# Patient Record
Sex: Female | Born: 1950 | Race: White | Hispanic: No | Marital: Married
Health system: Midwestern US, Academic
[De-identification: ages and names within clinical notes are randomized; demographics above are authoritative.]

---

## 2017-03-13 ENCOUNTER — Inpatient Hospital Stay: Admit: 2017-03-13 | Discharge: 2017-03-18 | Disposition: A | Discharge status: Home / Self Care | Payer: MEDICARE

## 2017-03-13 ENCOUNTER — Emergency Department: Admit: 2017-03-13 | Discharge: 2017-03-13 | Payer: MEDICARE

## 2017-03-13 ENCOUNTER — Encounter: Admit: 2017-03-13 | Discharge: 2017-03-13 | Payer: MEDICARE

## 2017-03-13 DIAGNOSIS — K7290 Hepatic failure, unspecified without coma: ICD-10-CM

## 2017-03-13 DIAGNOSIS — K759 Inflammatory liver disease, unspecified: ICD-10-CM

## 2017-03-13 DIAGNOSIS — R17 Unspecified jaundice: ICD-10-CM

## 2017-03-13 MED ORDER — CEFTRIAXONE INJ 1GM IVP
1 g | Freq: Once | INTRAVENOUS | 0 refills | Status: CP
Start: 2017-03-13 — End: ?
  Administered 2017-03-14: 05:00:00 1 g via INTRAVENOUS

## 2017-03-14 ENCOUNTER — Encounter: Admit: 2017-03-14 | Discharge: 2017-03-14 | Payer: MEDICARE

## 2017-03-14 DIAGNOSIS — R17 Unspecified jaundice: ICD-10-CM

## 2017-03-14 DIAGNOSIS — C801 Malignant (primary) neoplasm, unspecified: Principal | ICD-10-CM

## 2017-03-14 LAB — URINALYSIS DIPSTICK POC
Lab: 6 (ref 5.0–8.0)
Lab: >=1 (ref 1.003–1.035)
Lab: NEGATIVE
Lab: NEGATIVE
Lab: POSITIVE — AB

## 2017-03-14 LAB — ACETAMINOPHEN LEVEL
Lab: <10 ug/mL (ref <20.1)
Lab: <10 ug/mL (ref <20.1)

## 2017-03-14 LAB — CBC AND DIFF
Lab: 0 10*3/uL (ref 0–0.20)
Lab: 0 10*3/uL (ref 0–0.45)
Lab: 6.5 10*3/uL (ref 4.5–11.0)

## 2017-03-14 LAB — COMPREHENSIVE METABOLIC PANEL
Lab: 0.6 mg/dL (ref 0.4–1.00)
Lab: 102 MMOL/L (ref 98–110)
Lab: 12 mg/dL (ref 7–25)
Lab: 127 MMOL/L — ABNORMAL LOW (ref 137–147)
Lab: 147 U/L — ABNORMAL HIGH (ref 25–110)
Lab: 2.2 g/dL — ABNORMAL LOW (ref 3.5–5.0)
Lab: 22 MMOL/L (ref 21–30)
Lab: 3 10*3/uL (ref 3–12)
Lab: 362 U/L — ABNORMAL HIGH (ref 7–56)
Lab: 553 U/L — ABNORMAL HIGH (ref 7–40)
Lab: 7.9 mg/dL — ABNORMAL LOW (ref 8.5–10.6)
Lab: 8.3 mg/dL — ABNORMAL HIGH (ref 0.3–1.2)
Lab: 8.6 g/dL — ABNORMAL HIGH (ref 6.0–8.0)
Lab: 90 mg/dL (ref 70–100)
Lab: >60 mL/min (ref >60)
Lab: >60 mL/min (ref >60)
Lab: 0.5 mg/dL (ref 0.4–1.00)
Lab: 1.9 g/dL — ABNORMAL LOW (ref 3.5–5.0)
Lab: 103 MMOL/L — ABNORMAL LOW (ref 98–110)
Lab: 11 mg/dL (ref 7–25)
Lab: 128 MMOL/L — ABNORMAL LOW (ref 137–147)
Lab: 135 U/L — ABNORMAL HIGH (ref 25–110)
Lab: 2 — ABNORMAL LOW (ref 3–12)
Lab: 23 MMOL/L (ref 21–30)
Lab: 299 U/L — ABNORMAL HIGH (ref 7–56)
Lab: 3.8 MMOL/L — ABNORMAL HIGH (ref 3.5–5.1)
Lab: 461 U/L — ABNORMAL HIGH (ref 7–40)
Lab: 7.5 g/dL (ref 6.0–8.0)
Lab: 7.6 mg/dL — ABNORMAL HIGH (ref 0.3–1.2)
Lab: 7.8 mg/dL — ABNORMAL LOW (ref 8.5–10.6)
Lab: 78 mg/dL (ref 70–100)
Lab: >60 mL/min (ref >60)
Lab: >60 mL/min (ref >60)

## 2017-03-14 LAB — HEPATITIS PANEL, ACUTE
Lab: NEGATIVE
Lab: NEGATIVE
Lab: NEGATIVE
Lab: NEGATIVE

## 2017-03-14 LAB — IRON + BINDING CAPACITY + %SAT+ FERRITIN
Lab: 134 ug/dL (ref 50–160)
Lab: 180 ug/dL — ABNORMAL LOW (ref 270–380)
Lab: 634 ng/mL — ABNORMAL HIGH (ref 10–200)
Lab: 74 % — ABNORMAL HIGH (ref 28–42)

## 2017-03-14 LAB — AMPHETAMINES-URINE RANDOM: Lab: NEGATIVE

## 2017-03-14 LAB — COCAINE-URINE RANDOM: Lab: NEGATIVE

## 2017-03-14 LAB — URINALYSIS, MICROSCOPIC

## 2017-03-14 LAB — URINALYSIS DIPSTICK: Lab: NEGATIVE

## 2017-03-14 LAB — PROTIME INR (PT)
Lab: 2.2 — ABNORMAL HIGH (ref 0.8–1.2)
Lab: 2.3 — ABNORMAL HIGH (ref 0.8–1.2)

## 2017-03-14 LAB — ALPHA FETO PROTEIN (AFP): Lab: 22 ng/mL — ABNORMAL HIGH (ref >60)

## 2017-03-14 LAB — HEMOGLOBIN A1C
Lab: 5.3 % (ref 4.0–6.0)
Lab: 5.3 % (ref 4.0–6.0)

## 2017-03-14 LAB — BARBITURATES-URINE RANDOM: Lab: NEGATIVE

## 2017-03-14 LAB — CBC
Lab: 3.5 M/UL — ABNORMAL LOW (ref 4.0–5.0)
Lab: 31 % — ABNORMAL LOW (ref 36–45)
Lab: 32 pg (ref 26–34)
Lab: 5.4 10*3/uL (ref 4.5–11.0)

## 2017-03-14 LAB — PHOSPHORUS: Lab: 3.1 mg/dL (ref 2.0–4.5)

## 2017-03-14 LAB — SODIUM-URINE RANDOM: Lab: 76 MMOL/L

## 2017-03-14 LAB — POC TROPONIN: Lab: 0 ng/mL (ref 0.00–0.05)

## 2017-03-14 LAB — ALCOHOL LEVEL: Lab: <10 mg/dL

## 2017-03-14 LAB — CANNABINOIDS-URINE RANDOM: Lab: NEGATIVE

## 2017-03-14 LAB — OPIATES-URINE RANDOM: Lab: NEGATIVE

## 2017-03-14 LAB — BILIRUBIN, DIRECT: Lab: 5.4 mg/dL — ABNORMAL HIGH (ref <0.4)

## 2017-03-14 LAB — LIPASE: Lab: 55 U/L (ref 11–82)

## 2017-03-14 LAB — PHENCYCLIDINES-URINE RANDOM: Lab: NEGATIVE

## 2017-03-14 LAB — BENZODIAZEPINES-URINE RANDOM: Lab: NEGATIVE

## 2017-03-14 MED ORDER — HEPARIN, PORCINE (PF) 5,000 UNIT/0.5 ML IJ SYRG
5000 [IU] | SUBCUTANEOUS | 0 refills | Status: DC
Start: 2017-03-14 — End: 2017-03-16
  Administered 2017-03-14: 22:00:00 5000 [IU] via SUBCUTANEOUS

## 2017-03-14 MED ORDER — PHYTONADIONE (VITAMIN K1) 5 MG PO TAB
10 mg | Freq: Every day | ORAL | 0 refills | Status: CP
Start: 2017-03-14 — End: ?
  Administered 2017-03-15 – 2017-03-16 (×3): 10 mg via ORAL

## 2017-03-14 MED ORDER — MAGNESIUM SULFATE IN D5W 1 GRAM/100 ML IV PGBK
1 g | INTRAVENOUS | 0 refills | Status: DC | PRN
Start: 2017-03-14 — End: 2017-03-14

## 2017-03-14 MED ORDER — CEFTRIAXONE INJ 1GM IVP
1 g | INTRAVENOUS | 0 refills | Status: DC
Start: 2017-03-14 — End: 2017-03-18
  Administered 2017-03-15 – 2017-03-18 (×4): 1 g via INTRAVENOUS

## 2017-03-14 MED ORDER — MELATONIN 3 MG PO TAB
3 mg | Freq: Every evening | ORAL | 0 refills | Status: DC | PRN
Start: 2017-03-14 — End: 2017-03-18

## 2017-03-14 MED ORDER — IOHEXOL 350 MG IODINE/ML IV SOLN
100 mL | Freq: Once | INTRAVENOUS | 0 refills | Status: CP
Start: 2017-03-14 — End: ?
  Administered 2017-03-14: 07:00:00 100 mL via INTRAVENOUS

## 2017-03-14 MED ORDER — SODIUM CHLORIDE 0.9 % FLUSH
3-5 mL | Freq: Three times a day (TID) | INTRAVENOUS | 0 refills | Status: DC
Start: 2017-03-14 — End: 2017-03-17

## 2017-03-14 MED ORDER — POTASSIUM CHLORIDE 20 MEQ PO TBTQ
40-60 meq | ORAL | 0 refills | Status: DC | PRN
Start: 2017-03-14 — End: 2017-03-14

## 2017-03-14 MED ORDER — SODIUM CHLORIDE 0.9 % IJ SOLN
50 mL | Freq: Once | INTRAVENOUS | 0 refills | Status: CP
Start: 2017-03-14 — End: ?
  Administered 2017-03-14: 07:00:00 50 mL via INTRAVENOUS

## 2017-03-14 MED ORDER — DOCUSATE SODIUM 100 MG PO CAP
100 mg | Freq: Every day | ORAL | 0 refills | Status: DC | PRN
Start: 2017-03-14 — End: 2017-03-18

## 2017-03-14 MED ORDER — POTASSIUM CHLORIDE 20 MEQ/15 ML PO LIQD
40-60 meq | NASOGASTRIC | 0 refills | Status: DC | PRN
Start: 2017-03-14 — End: 2017-03-14

## 2017-03-14 MED ORDER — ONDANSETRON HCL (PF) 4 MG/2 ML IJ SOLN
4 mg | INTRAVENOUS | 0 refills | Status: DC | PRN
Start: 2017-03-14 — End: 2017-03-18
  Administered 2017-03-17 (×2): 4 mg via INTRAVENOUS

## 2017-03-14 MED ORDER — ACETAMINOPHEN 325 MG PO TAB
325 mg | ORAL | 0 refills | Status: DC | PRN
Start: 2017-03-14 — End: 2017-03-18
  Administered 2017-03-14 – 2017-03-18 (×4): 325 mg via ORAL

## 2017-03-15 ENCOUNTER — Inpatient Hospital Stay: Admit: 2017-03-15 | Discharge: 2017-03-15 | Payer: MEDICARE

## 2017-03-15 ENCOUNTER — Encounter: Admit: 2017-03-15 | Discharge: 2017-03-15 | Payer: MEDICARE

## 2017-03-15 DIAGNOSIS — C801 Malignant (primary) neoplasm, unspecified: Principal | ICD-10-CM

## 2017-03-15 DIAGNOSIS — R17 Unspecified jaundice: ICD-10-CM

## 2017-03-15 DIAGNOSIS — K7581 Nonalcoholic steatohepatitis (NASH): Principal | ICD-10-CM

## 2017-03-15 LAB — PROTIME INR (PT): Lab: 2.1 — ABNORMAL HIGH (ref 0.8–1.2)

## 2017-03-15 LAB — COMPREHENSIVE METABOLIC PANEL
Lab: 1.8 g/dL — ABNORMAL LOW (ref 3.5–5.0)
Lab: 119 U/L — ABNORMAL HIGH (ref 25–110)
Lab: 126 MMOL/L — ABNORMAL LOW (ref 137–147)
Lab: 21 MMOL/L (ref 21–30)
Lab: 290 U/L — ABNORMAL HIGH (ref 7–56)
Lab: 464 U/L — ABNORMAL HIGH (ref 7–40)
Lab: 7.6 mg/dL — ABNORMAL HIGH (ref 0.3–1.2)
Lab: 7.8 g/dL — ABNORMAL LOW (ref 6.0–8.0)
Lab: 8.2 mg/dL — ABNORMAL LOW (ref 8.5–10.6)
Lab: >60 mL/min (ref >60)
Lab: >60 mL/min (ref >60)
Lab: 2 — ABNORMAL LOW (ref 3–12)

## 2017-03-15 LAB — ANTI-SMOOTH MUSCLE AB: Lab: <20 {titer} (ref <20)

## 2017-03-15 LAB — ANTI-NUCLEAR ANTIBODY(ANA)

## 2017-03-15 LAB — ANTI-NUCLEAR AB(ANA)-QUANT: Lab: 160 — ABNORMAL HIGH (ref <80)

## 2017-03-15 LAB — CBC: Lab: 4.7 10*3/uL (ref 4.5–11.0)

## 2017-03-15 LAB — ANTI-MITOCHONDRIAL ANTIBODY: Lab: <20 {titer} (ref <20)

## 2017-03-15 LAB — ALPHA-1-ANTITRYPSIN, TOTAL: Lab: 115

## 2017-03-15 MED ORDER — GADOBENATE DIMEGLUMINE 529 MG/ML (0.1MMOL/0.2ML) IV SOLN
15 mL | Freq: Once | INTRAVENOUS | 0 refills | Status: CP
Start: 2017-03-15 — End: ?
  Administered 2017-03-15: 06:00:00 15 mL via INTRAVENOUS

## 2017-03-15 MED ORDER — MORPHINE 2 MG/ML IV CRTG
1-2 mg | INTRAVENOUS | 0 refills | Status: DC | PRN
Start: 2017-03-15 — End: 2017-03-18

## 2017-03-15 MED ORDER — FENTANYL CITRATE (PF) 50 MCG/ML IJ SOLN
0 refills | Status: CP
Start: 2017-03-15 — End: ?
  Administered 2017-03-15 (×2): 50 ug via INTRAVENOUS

## 2017-03-15 MED ORDER — MIDAZOLAM 1 MG/ML IJ SOLN
1 mg | Freq: Once | INTRAVENOUS | 0 refills | Status: CP
Start: 2017-03-15 — End: ?
  Administered 2017-03-15: 21:00:00 1 mg via INTRAVENOUS

## 2017-03-15 MED ORDER — MIDAZOLAM 1 MG/ML IJ SOLN
0 refills | Status: CP
Start: 2017-03-15 — End: ?
  Administered 2017-03-15: 21:00:00 1 mg via INTRAVENOUS

## 2017-03-15 MED ORDER — FENTANYL CITRATE (PF) 50 MCG/ML IJ SOLN
50 ug | Freq: Once | INTRAVENOUS | 0 refills | Status: CP
Start: 2017-03-15 — End: ?
  Administered 2017-03-15: 21:00:00 50 ug via INTRAVENOUS

## 2017-03-15 MED ORDER — IOPAMIDOL 61 % IV SOLN
25 mL | Freq: Once | INTRAVENOUS | 0 refills | Status: CP
Start: 2017-03-15 — End: ?
  Administered 2017-03-15: 21:00:00 25 mL via INTRAVENOUS

## 2017-03-15 MED ORDER — FENTANYL CITRATE (PF) 50 MCG/ML IJ SOLN
0 refills | Status: CP
Start: 2017-03-15 — End: ?
  Administered 2017-03-15: 21:00:00 50 ug via INTRAVENOUS

## 2017-03-15 MED ORDER — SODIUM CHLORIDE 0.9 % IV SOLP
INTRAVENOUS | 0 refills | Status: CN
Start: 2017-03-15 — End: ?

## 2017-03-16 ENCOUNTER — Encounter: Admit: 2017-03-16 | Discharge: 2017-03-16 | Payer: MEDICARE

## 2017-03-16 DIAGNOSIS — R160 Hepatomegaly, not elsewhere classified: Principal | ICD-10-CM

## 2017-03-16 DIAGNOSIS — K7469 Other cirrhosis of liver: ICD-10-CM

## 2017-03-16 LAB — COMPREHENSIVE METABOLIC PANEL: Lab: 129 MMOL/L — ABNORMAL LOW (ref 137–147)

## 2017-03-16 LAB — CERULOPLASMIN: Lab: 24 mg/dL (ref 20.0–60.0)

## 2017-03-16 LAB — CULTURE-URINE W/SENSITIVITY
Lab: >10
Lab: >10 — AB

## 2017-03-16 LAB — PROTIME INR (PT): Lab: 2 M/UL — ABNORMAL HIGH (ref 0.8–1.2)

## 2017-03-16 LAB — CBC: Lab: 4.2 K/UL — ABNORMAL LOW (ref >60)

## 2017-03-16 MED ORDER — SULFAMETHOXAZOLE-TRIMETHOPRIM 800-160 MG PO TAB
1 | ORAL | 0 refills | Status: DC
Start: 2017-03-16 — End: 2017-03-18

## 2017-03-16 MED ORDER — CALCIUM CARBONATE-VITAMIN D3 500 MG(1,250MG) -200 UNIT PO TAB
1 | Freq: Two times a day (BID) | ORAL | 0 refills | Status: DC
Start: 2017-03-16 — End: 2017-03-18
  Administered 2017-03-17 – 2017-03-18 (×4): 1 via ORAL

## 2017-03-16 MED ORDER — TRAMADOL 50 MG PO TAB
50 mg | ORAL | 0 refills | Status: DC | PRN
Start: 2017-03-16 — End: 2017-03-18

## 2017-03-16 MED ORDER — PREDNISONE 20 MG PO TAB
40 mg | Freq: Every day | ORAL | 0 refills | Status: DC
Start: 2017-03-16 — End: 2017-03-18
  Administered 2017-03-16 – 2017-03-18 (×3): 40 mg via ORAL

## 2017-03-16 MED ORDER — LACTULOSE 10 GRAM/15 ML PO SOLN
30 mL | Freq: Three times a day (TID) | ORAL | 0 refills | Status: DC
Start: 2017-03-16 — End: 2017-03-18
  Administered 2017-03-17 – 2017-03-18 (×5): 20 g via ORAL

## 2017-03-17 LAB — PROTIME INR (PT): Lab: 2.1 M/UL — ABNORMAL HIGH (ref >60)

## 2017-03-17 LAB — COMPREHENSIVE METABOLIC PANEL: Lab: 129 MMOL/L — ABNORMAL LOW (ref >60)

## 2017-03-17 LAB — CBC: Lab: 4.4 K/UL — ABNORMAL LOW (ref 4.5–11.0)

## 2017-03-17 MED ORDER — SODIUM CHLORIDE 0.9 % IV SOLP
INTRAVENOUS | 0 refills | Status: DC
Start: 2017-03-17 — End: 2017-03-17

## 2017-03-18 ENCOUNTER — Emergency Department: Admit: 2017-03-13 | Discharge: 2017-03-13 | Payer: MEDICARE

## 2017-03-18 ENCOUNTER — Encounter: Admit: 2017-03-18 | Discharge: 2017-03-18 | Payer: MEDICARE

## 2017-03-18 ENCOUNTER — Inpatient Hospital Stay: Admit: 2017-03-14 | Discharge: 2017-03-14 | Payer: MEDICARE

## 2017-03-18 DIAGNOSIS — K7460 Unspecified cirrhosis of liver: ICD-10-CM

## 2017-03-18 DIAGNOSIS — R5381 Other malaise: ICD-10-CM

## 2017-03-18 DIAGNOSIS — Z800 Family history of malignant neoplasm of digestive organs: ICD-10-CM

## 2017-03-18 DIAGNOSIS — K739 Chronic hepatitis, unspecified: ICD-10-CM

## 2017-03-18 DIAGNOSIS — K7200 Acute and subacute hepatic failure without coma: ICD-10-CM

## 2017-03-18 DIAGNOSIS — K754 Autoimmune hepatitis: Principal | ICD-10-CM

## 2017-03-18 DIAGNOSIS — B9620 Unspecified Escherichia coli [E. coli] as the cause of diseases classified elsewhere: ICD-10-CM

## 2017-03-18 DIAGNOSIS — F17210 Nicotine dependence, cigarettes, uncomplicated: ICD-10-CM

## 2017-03-18 DIAGNOSIS — Z9049 Acquired absence of other specified parts of digestive tract: ICD-10-CM

## 2017-03-18 DIAGNOSIS — N3001 Acute cystitis with hematuria: ICD-10-CM

## 2017-03-18 DIAGNOSIS — Z89611 Acquired absence of right leg above knee: ICD-10-CM

## 2017-03-18 DIAGNOSIS — K7581 Nonalcoholic steatohepatitis (NASH): ICD-10-CM

## 2017-03-18 DIAGNOSIS — E871 Hypo-osmolality and hyponatremia: ICD-10-CM

## 2017-03-18 DIAGNOSIS — Z85828 Personal history of other malignant neoplasm of skin: ICD-10-CM

## 2017-03-18 DIAGNOSIS — D696 Thrombocytopenia, unspecified: ICD-10-CM

## 2017-03-18 LAB — COMPREHENSIVE METABOLIC PANEL: Lab: 131 MMOL/L — ABNORMAL LOW (ref >60)

## 2017-03-18 LAB — T SPOT TB (QUANTIFERON TB)
Lab: NEGATIVE
Lab: 0
Lab: 0

## 2017-03-18 LAB — CBC: Lab: 9 K/UL — ABNORMAL LOW (ref >60)

## 2017-03-18 LAB — IMMUNOGLOBULIN G (IGG): Lab: 454 mg/dL — ABNORMAL HIGH (ref 762–1488)

## 2017-03-18 LAB — PROTIME INR (PT): Lab: 1.9 MMOL/L — ABNORMAL HIGH (ref >60)

## 2017-03-18 MED ORDER — PREDNISONE 20 MG PO TAB
40 mg | ORAL_TABLET | Freq: Every day | ORAL | 0 refills | Status: AC
Start: 2017-03-18 — End: 2017-03-30
  Filled 2017-03-18 (×2): qty 30, 15d supply, fill #1

## 2017-03-18 MED ORDER — OXYCODONE-ACETAMINOPHEN 7.5-325 MG PO TAB
1-2 | ORAL_TABLET | ORAL | 0 refills | 2.00000 days | Status: AC | PRN
Start: 2017-03-18 — End: 2017-07-26
  Filled 2017-03-18: qty 20, 3d supply

## 2017-03-18 MED ORDER — SULFAMETHOXAZOLE-TRIMETHOPRIM 800-160 MG PO TAB
1 | ORAL_TABLET | ORAL | 0 refills | Status: AC
Start: 2017-03-18 — End: 2017-03-30
  Filled 2017-03-18 (×2): qty 9, 21d supply, fill #1

## 2017-03-18 MED ORDER — CALCIUM CARBONATE-VITAMIN D3 500 MG(1,250MG) -200 UNIT PO TAB
1 | ORAL_TABLET | Freq: Two times a day (BID) | ORAL | 1 refills | 33.00000 days | Status: AC
Start: 2017-03-18 — End: 2017-06-15

## 2017-03-18 MED ORDER — LACTULOSE 10 GRAM/15 ML PO SOLN
20 g | Freq: Three times a day (TID) | ORAL | 1 refills | 21.00000 days | Status: AC
Start: 2017-03-18 — End: 2017-03-23
  Filled 2017-03-18 (×2): qty 473, 5d supply, fill #1

## 2017-03-19 LAB — THIOPURINE METHYLTRANSFERASE RBC: Lab: 28

## 2017-03-20 ENCOUNTER — Encounter: Admit: 2017-03-20 | Discharge: 2017-03-20 | Payer: MEDICARE

## 2017-03-20 LAB — HEMOCHROMATOSIS HFE GENE ANAL

## 2017-03-21 ENCOUNTER — Encounter: Admit: 2017-03-21 | Discharge: 2017-03-21 | Payer: MEDICARE

## 2017-03-23 ENCOUNTER — Encounter: Admit: 2017-03-23 | Discharge: 2017-03-23 | Payer: MEDICARE

## 2017-03-23 MED ORDER — LACTULOSE 10 GRAM/15 ML PO SOLN
20 g | Freq: Three times a day (TID) | ORAL | 1 refills | 21.00000 days | Status: AC
Start: 2017-03-23 — End: 2017-04-19

## 2017-03-26 ENCOUNTER — Encounter: Admit: 2017-03-26 | Discharge: 2017-03-26 | Payer: MEDICARE

## 2017-03-27 ENCOUNTER — Encounter: Admit: 2017-03-27 | Discharge: 2017-03-27 | Payer: MEDICARE

## 2017-03-27 DIAGNOSIS — K7469 Other cirrhosis of liver: Principal | ICD-10-CM

## 2017-03-27 LAB — COMPREHENSIVE METABOLIC PANEL
Lab: 0.7
Lab: 102
Lab: 12
Lab: 3.7
Lab: 4.7 — ABNORMAL HIGH (ref 0.0–1.0)
Lab: 7.9 — ABNORMAL LOW (ref 8.4–10.2)
Lab: 1.7 — ABNORMAL LOW (ref 3.4–4.8)
Lab: 10
Lab: 130 — ABNORMAL LOW (ref 136–145)
Lab: 168 — ABNORMAL HIGH (ref 5–34)
Lab: 216 — ABNORMAL HIGH (ref 40–150)
Lab: 22 — ABNORMAL LOW (ref 23–31)
Lab: 222 — ABNORMAL HIGH (ref 0–55)
Lab: 7.6
Lab: 84
Lab: 93 ratio

## 2017-03-27 LAB — PROTIME INR (PT): Lab: 17 — ABNORMAL HIGH (ref 9.9–12.1)

## 2017-03-28 ENCOUNTER — Encounter: Admit: 2017-03-28 | Discharge: 2017-03-28 | Payer: MEDICARE

## 2017-03-28 DIAGNOSIS — K7460 Unspecified cirrhosis of liver: Principal | ICD-10-CM

## 2017-03-28 DIAGNOSIS — R160 Hepatomegaly, not elsewhere classified: Principal | ICD-10-CM

## 2017-03-28 LAB — CBC AND DIFF
Lab: 17 — ABNORMAL HIGH (ref 11.5–14.5)
Lab: 3.9 — ABNORMAL LOW (ref 4.20–5.40)
Lab: 0.2
Lab: 0.8 — ABNORMAL LOW (ref 0.9–5.1)
Lab: 100 — ABNORMAL LOW (ref 130–400)
Lab: 12
Lab: 2
Lab: 31 — ABNORMAL HIGH (ref 27.0–31.0)
Lab: 33
Lab: 38
Lab: 8
Lab: 85 — ABNORMAL HIGH (ref 40–75)
Lab: 9 — ABNORMAL LOW (ref 18–47)
Lab: 9.4
Lab: 95

## 2017-03-29 ENCOUNTER — Encounter: Admit: 2017-03-29 | Discharge: 2017-03-29 | Payer: MEDICARE

## 2017-03-29 ENCOUNTER — Ambulatory Visit: Admit: 2017-03-29 | Discharge: 2017-03-30 | Payer: MEDICARE

## 2017-03-29 MED ORDER — MIDAZOLAM 1 MG/ML IJ SOLN
0 refills | Status: CP
Start: 2017-03-29 — End: ?
  Administered 2017-03-29 (×2): 1 mg via INTRAVENOUS

## 2017-03-29 MED ORDER — SODIUM CHLORIDE 0.9 % IJ SOLN
50 mL | Freq: Once | INTRAVENOUS | 0 refills | Status: CP
Start: 2017-03-29 — End: ?
  Administered 2017-03-29: 22:00:00 50 mL via INTRAVENOUS

## 2017-03-29 MED ORDER — IOHEXOL 350 MG IODINE/ML IV SOLN
80 mL | Freq: Once | INTRAVENOUS | 0 refills | Status: CP
Start: 2017-03-29 — End: ?
  Administered 2017-03-29: 22:00:00 80 mL via INTRAVENOUS

## 2017-03-29 MED ORDER — FENTANYL CITRATE (PF) 50 MCG/ML IJ SOLN
50 ug | Freq: Once | INTRAVENOUS | 0 refills | Status: CP
Start: 2017-03-29 — End: ?
  Administered 2017-03-29: 21:00:00 50 ug via INTRAVENOUS

## 2017-03-29 MED ORDER — FENTANYL CITRATE (PF) 50 MCG/ML IJ SOLN
0 refills | Status: CP
Start: 2017-03-29 — End: ?
  Administered 2017-03-29 (×2): 50 ug via INTRAVENOUS

## 2017-03-29 MED ORDER — MIDAZOLAM 1 MG/ML IJ SOLN
1 mg | Freq: Once | INTRAVENOUS | 0 refills | Status: CP
Start: 2017-03-29 — End: ?
  Administered 2017-03-29: 21:00:00 1 mg via INTRAVENOUS

## 2017-03-30 ENCOUNTER — Encounter: Admit: 2017-03-30 | Discharge: 2017-03-30 | Payer: MEDICARE

## 2017-03-30 DIAGNOSIS — Z881 Allergy status to other antibiotic agents status: ICD-10-CM

## 2017-03-30 DIAGNOSIS — K7581 Nonalcoholic steatohepatitis (NASH): ICD-10-CM

## 2017-03-30 DIAGNOSIS — K7290 Hepatic failure, unspecified without coma: ICD-10-CM

## 2017-03-30 DIAGNOSIS — Z79899 Other long term (current) drug therapy: ICD-10-CM

## 2017-03-30 DIAGNOSIS — Z85830 Personal history of malignant neoplasm of bone: ICD-10-CM

## 2017-03-30 DIAGNOSIS — K7469 Other cirrhosis of liver: Principal | ICD-10-CM

## 2017-03-30 DIAGNOSIS — F17210 Nicotine dependence, cigarettes, uncomplicated: ICD-10-CM

## 2017-03-30 MED ORDER — SULFAMETHOXAZOLE-TRIMETHOPRIM 800-160 MG PO TAB
1 | ORAL_TABLET | ORAL | 6 refills | Status: AC
Start: 2017-03-30 — End: ?

## 2017-03-30 MED ORDER — PREDNISONE 20 MG PO TAB
40 mg | ORAL_TABLET | Freq: Every day | ORAL | 1 refills | Status: AC
Start: 2017-03-30 — End: 2017-05-01

## 2017-04-02 ENCOUNTER — Encounter: Admit: 2017-04-02 | Discharge: 2017-04-02 | Payer: MEDICARE

## 2017-04-03 ENCOUNTER — Encounter: Admit: 2017-04-03 | Discharge: 2017-04-03 | Payer: MEDICARE

## 2017-04-03 DIAGNOSIS — K7469 Other cirrhosis of liver: Principal | ICD-10-CM

## 2017-04-03 LAB — COMPREHENSIVE METABOLIC PANEL
Lab: 130 — ABNORMAL LOW (ref 136–145)
Lab: 136 — ABNORMAL HIGH (ref 0–55)
Lab: 168 — ABNORMAL HIGH (ref 40–150)
Lab: 3.4 — ABNORMAL HIGH (ref 0.0–1.0)
Lab: 4
Lab: 75 — ABNORMAL LOW (ref 80–115)
Lab: 8.2 — ABNORMAL LOW (ref 8.4–10.2)
Lab: 1.7 — ABNORMAL LOW (ref 3.4–4.8)
Lab: 100
Lab: 104
Lab: 23
Lab: 7
Lab: 7.3
Lab: 86 — ABNORMAL HIGH (ref 5–34)

## 2017-04-03 LAB — PROTIME INR (PT)
Lab: 15 — ABNORMAL HIGH (ref 9.9–12.1)
Lab: 1.5 ratio

## 2017-04-04 ENCOUNTER — Encounter: Admit: 2017-04-04 | Discharge: 2017-04-04 | Payer: MEDICARE

## 2017-04-04 DIAGNOSIS — K7460 Unspecified cirrhosis of liver: Principal | ICD-10-CM

## 2017-04-05 ENCOUNTER — Encounter: Admit: 2017-04-05 | Discharge: 2017-04-05 | Payer: MEDICARE

## 2017-04-05 DIAGNOSIS — K7460 Unspecified cirrhosis of liver: Principal | ICD-10-CM

## 2017-04-05 LAB — CBC AND DIFF
Lab: 0
Lab: 0.1
Lab: 11 — ABNORMAL LOW (ref 12.0–16.0)
Lab: 35 — ABNORMAL LOW (ref 37.0–47.0)
Lab: 7.6
Lab: 0.3
Lab: 1.3
Lab: 3.6 — ABNORMAL LOW (ref 4.20–5.40)

## 2017-04-06 ENCOUNTER — Encounter: Admit: 2017-04-06 | Discharge: 2017-04-06 | Payer: MEDICARE

## 2017-04-10 ENCOUNTER — Encounter: Admit: 2017-04-10 | Discharge: 2017-04-10 | Payer: MEDICARE

## 2017-04-10 DIAGNOSIS — K7469 Other cirrhosis of liver: Principal | ICD-10-CM

## 2017-04-10 DIAGNOSIS — K7460 Unspecified cirrhosis of liver: Principal | ICD-10-CM

## 2017-04-10 LAB — COMPREHENSIVE METABOLIC PANEL
Lab: 1.9 — ABNORMAL LOW (ref 3.4–4.8)
Lab: 104
Lab: 12
Lab: 174 — ABNORMAL HIGH (ref 40–150)
Lab: 24
Lab: 64 — ABNORMAL HIGH (ref 5–34)
Lab: 8.3 — ABNORMAL LOW (ref 8.4–10.2)
Lab: 0.6 mg/dL
Lab: 103 — ABNORMAL HIGH (ref 0–55)
Lab: 134 mmol/L — ABNORMAL LOW (ref 136–145)
Lab: 4.1
Lab: 7.4
Lab: 93
Lab: 99 ratio

## 2017-04-10 LAB — PROTIME INR (PT): Lab: 14 — ABNORMAL HIGH (ref 9.9–12.1)

## 2017-04-11 ENCOUNTER — Encounter: Admit: 2017-04-11 | Discharge: 2017-04-11 | Payer: MEDICARE

## 2017-04-11 DIAGNOSIS — K7460 Unspecified cirrhosis of liver: Principal | ICD-10-CM

## 2017-04-11 LAB — CBC AND DIFF
Lab: 0
Lab: 0.5
Lab: 0.5
Lab: 0.5
Lab: 1.8
Lab: 6.8
Lab: 0
Lab: 11 mg/dL — ABNORMAL LOW (ref 12.0–16.0)
Lab: 19 K/UL (ref 0–0.45)
Lab: 3.5 mg/dL — ABNORMAL LOW (ref 4.20–5.40)
Lab: 33 % (ref 0–2)
Lab: 35 mg/dL — ABNORMAL LOW (ref 37.0–47.0)
Lab: 5.3 10*3/uL (ref 0–0.20)
Lab: 9.2 mg/dL — ABNORMAL HIGH (ref >60)
Lab: 94 K/UL — ABNORMAL LOW (ref 130–400)
Lab: 99 mL/min — ABNORMAL HIGH (ref >60)

## 2017-04-12 ENCOUNTER — Encounter: Admit: 2017-04-12 | Discharge: 2017-04-12 | Payer: MEDICARE

## 2017-04-16 ENCOUNTER — Encounter: Admit: 2017-04-16 | Discharge: 2017-04-16 | Payer: MEDICARE

## 2017-04-16 DIAGNOSIS — K7469 Other cirrhosis of liver: Principal | ICD-10-CM

## 2017-04-16 LAB — COMPREHENSIVE METABOLIC PANEL
Lab: 15
Lab: 2.1 — ABNORMAL LOW (ref 3.4–4.8)
Lab: 48 — ABNORMAL HIGH (ref 5–34)
Lab: 7.3
Lab: 0.6 mg/dL
Lab: 10
Lab: 102
Lab: 133 mmol/L — ABNORMAL LOW (ref 136–145)
Lab: 167 — ABNORMAL HIGH (ref 40–150)
Lab: 2.3 mg/dL — ABNORMAL HIGH (ref 0.0–1.0)
Lab: 25
Lab: 3.8
Lab: 71 ratio — ABNORMAL LOW (ref 80–115)
Lab: 73 — ABNORMAL HIGH (ref 0–55)
Lab: 8.5
Lab: 92

## 2017-04-16 LAB — PROTIME INR (PT): Lab: 13 — ABNORMAL HIGH (ref 9.9–12.1)

## 2017-04-17 ENCOUNTER — Encounter: Admit: 2017-04-17 | Discharge: 2017-04-17 | Payer: MEDICARE

## 2017-04-17 DIAGNOSIS — K7460 Unspecified cirrhosis of liver: Principal | ICD-10-CM

## 2017-04-17 LAB — CBC AND DIFF
Lab: 0
Lab: 0.5
Lab: 0.7
Lab: 101 — ABNORMAL HIGH (ref 80.0–99.0)
Lab: 12
Lab: 2.7
Lab: 3.7 — ABNORMAL LOW (ref 4.20–5.40)
Lab: 5.4
Lab: 6.2
Lab: 61
Lab: 0.1
Lab: 0.5
Lab: 102 — ABNORMAL LOW (ref 130–400)
Lab: 16 — ABNORMAL HIGH (ref 11.5–14.5)
Lab: 30
Lab: 33
Lab: 8.7

## 2017-04-19 ENCOUNTER — Encounter: Admit: 2017-04-19 | Discharge: 2017-04-19 | Payer: MEDICARE

## 2017-04-19 DIAGNOSIS — R160 Hepatomegaly, not elsewhere classified: Principal | ICD-10-CM

## 2017-04-19 MED ORDER — LACTULOSE 10 GRAM/15 ML PO SOLN
20 g | Freq: Three times a day (TID) | ORAL | 11 refills | 21.00000 days | Status: AC
Start: 2017-04-19 — End: ?

## 2017-04-20 ENCOUNTER — Encounter: Admit: 2017-04-20 | Discharge: 2017-04-20 | Payer: MEDICARE

## 2017-04-23 ENCOUNTER — Encounter: Admit: 2017-04-23 | Discharge: 2017-04-23 | Payer: MEDICARE

## 2017-04-23 DIAGNOSIS — K7469 Other cirrhosis of liver: Principal | ICD-10-CM

## 2017-04-23 LAB — PROTIME INR (PT)
Lab: 1.2 ratio (ref 4–12)
Lab: 12 % — ABNORMAL HIGH (ref 9.9–12.1)

## 2017-04-30 ENCOUNTER — Ambulatory Visit: Admit: 2017-04-30 | Discharge: 2017-04-30 | Payer: MEDICARE

## 2017-04-30 ENCOUNTER — Encounter: Admit: 2017-04-30 | Discharge: 2017-04-30 | Payer: MEDICARE

## 2017-04-30 DIAGNOSIS — C220 Liver cell carcinoma: Principal | ICD-10-CM

## 2017-04-30 DIAGNOSIS — Z881 Allergy status to other antibiotic agents status: ICD-10-CM

## 2017-04-30 DIAGNOSIS — R160 Hepatomegaly, not elsewhere classified: ICD-10-CM

## 2017-04-30 MED ORDER — MIDAZOLAM 1 MG/ML IJ SOLN
1 mg | Freq: Once | INTRAVENOUS | 0 refills | Status: CP
Start: 2017-04-30 — End: ?
  Administered 2017-04-30: 16:00:00 1 mg via INTRAVENOUS

## 2017-04-30 MED ORDER — FENTANYL CITRATE (PF) 50 MCG/ML IJ SOLN
50 ug | Freq: Once | INTRAVENOUS | 0 refills | Status: CP
Start: 2017-04-30 — End: ?
  Administered 2017-04-30: 16:00:00 50 ug via INTRAVENOUS

## 2017-04-30 MED ORDER — MIDAZOLAM 1 MG/ML IJ SOLN
0 refills | Status: CP
Start: 2017-04-30 — End: ?
  Administered 2017-04-30 (×3): 1 mg via INTRAVENOUS

## 2017-04-30 MED ORDER — FENTANYL CITRATE (PF) 50 MCG/ML IJ SOLN
0 refills | Status: CP
Start: 2017-04-30 — End: ?
  Administered 2017-04-30: 16:00:00 50 ug via INTRAVENOUS

## 2017-05-01 ENCOUNTER — Encounter: Admit: 2017-05-01 | Discharge: 2017-05-01 | Payer: MEDICARE

## 2017-05-01 MED ORDER — PREDNISONE 20 MG PO TAB
40 mg | ORAL_TABLET | Freq: Every day | ORAL | 1 refills | Status: AC
Start: 2017-05-01 — End: 2017-05-09

## 2017-05-03 ENCOUNTER — Encounter: Admit: 2017-05-03 | Discharge: 2017-05-03 | Payer: MEDICARE

## 2017-05-03 DIAGNOSIS — K7469 Other cirrhosis of liver: Principal | ICD-10-CM

## 2017-05-03 LAB — COMPREHENSIVE METABOLIC PANEL
Lab: 13
Lab: 149
Lab: 2.3 — ABNORMAL LOW (ref 3.4–4.8)
Lab: 26
Lab: 55
Lab: 83
Lab: 0.7 mg/dL
Lab: 1.3 mg/dL — ABNORMAL HIGH (ref 0.0–1.0)
Lab: 104
Lab: 136 mmol/L
Lab: 3.8
Lab: 38 — ABNORMAL HIGH (ref 5–34)
Lab: 6.8
Lab: 76 — ABNORMAL LOW (ref 80–115)
Lab: 8.9

## 2017-05-03 LAB — PROTIME INR (PT): Lab: 12

## 2017-05-04 ENCOUNTER — Encounter: Admit: 2017-05-04 | Discharge: 2017-05-04 | Payer: MEDICARE

## 2017-05-04 DIAGNOSIS — K7460 Unspecified cirrhosis of liver: Principal | ICD-10-CM

## 2017-05-07 ENCOUNTER — Encounter: Admit: 2017-05-07 | Discharge: 2017-05-07 | Payer: MEDICARE

## 2017-05-07 DIAGNOSIS — K7460 Unspecified cirrhosis of liver: Principal | ICD-10-CM

## 2017-05-07 LAB — CBC AND DIFF
Lab: 0 FL — ABNORMAL HIGH (ref 80–100)
Lab: 0.1 pg (ref 26–34)
Lab: 0.3 U/L (ref 7–56)
Lab: 0.6 % — ABNORMAL LOW (ref 36–45)
Lab: 0.6 10*3/uL — ABNORMAL HIGH (ref 4.5–11.0)
Lab: 10 MMOL/L — ABNORMAL LOW (ref 3.5–5.1)
Lab: 14 mg/dL (ref >60)
Lab: 2.8 mL/min — ABNORMAL LOW (ref >60)
Lab: 3.6 % — ABNORMAL LOW (ref 4.20–5.40)
Lab: 34 mg/dL — ABNORMAL HIGH (ref 27.0–31.0)
Lab: 37 mg/dL (ref 7–25)
Lab: 6.2 % (ref 0–5)
Lab: 6.5 MMOL/L — ABNORMAL LOW (ref >60)
Lab: 65 U/L — ABNORMAL LOW (ref 25–110)

## 2017-05-08 ENCOUNTER — Encounter: Admit: 2017-05-08 | Discharge: 2017-05-08 | Payer: MEDICARE

## 2017-05-09 ENCOUNTER — Encounter: Admit: 2017-05-09 | Discharge: 2017-05-09 | Payer: MEDICARE

## 2017-05-09 ENCOUNTER — Ambulatory Visit: Admit: 2017-05-09 | Discharge: 2017-05-10 | Payer: MEDICARE

## 2017-05-09 ENCOUNTER — Ambulatory Visit: Admit: 2017-05-09 | Discharge: 2017-05-09 | Payer: MEDICARE

## 2017-05-09 DIAGNOSIS — C228 Malignant neoplasm of liver, primary, unspecified as to type: ICD-10-CM

## 2017-05-09 DIAGNOSIS — D7589 Other specified diseases of blood and blood-forming organs: Principal | ICD-10-CM

## 2017-05-09 DIAGNOSIS — K754 Autoimmune hepatitis: ICD-10-CM

## 2017-05-09 DIAGNOSIS — I8290 Acute embolism and thrombosis of unspecified vein: ICD-10-CM

## 2017-05-09 DIAGNOSIS — Z1211 Encounter for screening for malignant neoplasm of colon: Principal | ICD-10-CM

## 2017-05-09 DIAGNOSIS — E559 Vitamin D deficiency, unspecified: ICD-10-CM

## 2017-05-09 DIAGNOSIS — D899 Disorder involving the immune mechanism, unspecified: ICD-10-CM

## 2017-05-09 DIAGNOSIS — C801 Malignant (primary) neoplasm, unspecified: Principal | ICD-10-CM

## 2017-05-09 LAB — COMPREHENSIVE METABOLIC PANEL
Lab: 0.6 mg/dL — ABNORMAL LOW (ref 0.4–1.00)
Lab: 1.2 mg/dL — ABNORMAL LOW (ref 0.3–1.2)
Lab: 103 MMOL/L — ABNORMAL HIGH (ref 98–110)
Lab: 12 mg/dL — ABNORMAL HIGH (ref 7–25)
Lab: 136 MMOL/L — ABNORMAL LOW (ref 137–147)
Lab: 172 mg/dL — ABNORMAL HIGH (ref 70–100)
Lab: 27 U/L (ref 7–40)
Lab: 28 MMOL/L — ABNORMAL HIGH (ref 21–30)
Lab: 3.1 g/dL — ABNORMAL LOW (ref 3.5–5.0)
Lab: 37 U/L — ABNORMAL LOW (ref 7–56)
Lab: 4.2 MMOL/L — ABNORMAL HIGH (ref 3.5–5.1)
Lab: 5 K/UL (ref 3–12)
Lab: 6.9 g/dL — ABNORMAL HIGH (ref 6.0–8.0)
Lab: 9.4 mg/dL (ref 8.5–10.6)
Lab: >60 mL/min (ref >60)
Lab: >60 mL/min (ref >60)

## 2017-05-09 LAB — CBC AND DIFF
Lab: 12 g/dL (ref 12.0–15.0)
Lab: 3.6 M/UL — ABNORMAL LOW (ref 4.0–5.0)
Lab: 8.2 10*3/uL (ref 4.5–11.0)

## 2017-05-09 LAB — ALPHA FETO PROTEIN (AFP): Lab: 8.3 ng/mL (ref 0.0–15.0)

## 2017-05-09 LAB — 25-OH VITAMIN D (D2 + D3): Lab: 9.6 ng/mL — ABNORMAL LOW (ref 30–80)

## 2017-05-09 LAB — HEPATITIS B SURFACE AB

## 2017-05-09 LAB — HEPATITIS A IGG: Lab: POSITIVE

## 2017-05-09 LAB — PROTIME INR (PT): Lab: 1.1 U/L — ABNORMAL HIGH (ref 0.8–1.2)

## 2017-05-09 MED ORDER — SODIUM CHLORIDE 0.9 % IV SOLP
INTRAVENOUS | 0 refills | Status: CN
Start: 2017-05-09 — End: ?

## 2017-05-09 MED ORDER — FUROSEMIDE 20 MG PO TAB
20 mg | ORAL_TABLET | Freq: Two times a day (BID) | ORAL | 5 refills | 90.00000 days | Status: AC
Start: 2017-05-09 — End: 2017-11-14

## 2017-05-09 MED ORDER — AZATHIOPRINE 50 MG PO TAB
50 mg | ORAL_TABLET | Freq: Every day | ORAL | 5 refills | Status: AC
Start: 2017-05-09 — End: 2017-11-14

## 2017-05-09 MED ORDER — PREDNISONE 20 MG PO TAB
40 mg | ORAL_TABLET | Freq: Every day | ORAL | 5 refills | Status: AC
Start: 2017-05-09 — End: 2017-06-14

## 2017-05-09 MED ORDER — PREDNISONE 5 MG PO TAB
5 mg | ORAL_TABLET | Freq: Every day | ORAL | 5 refills | Status: AC
Start: 2017-05-09 — End: 2017-06-14

## 2017-05-09 MED ORDER — PEG-ELECTROLYTE SOLN 420 GRAM PO SOLR
8 L | Freq: Once | ORAL | 0 refills | Status: AC
Start: 2017-05-09 — End: ?

## 2017-05-09 MED ORDER — SPIRONOLACTONE 50 MG PO TAB
50 mg | ORAL_TABLET | Freq: Every day | ORAL | 5 refills | 90.00000 days | Status: AC
Start: 2017-05-09 — End: 2018-02-12

## 2017-05-10 ENCOUNTER — Encounter: Admit: 2017-05-10 | Discharge: 2017-05-10 | Payer: MEDICARE

## 2017-05-10 DIAGNOSIS — E559 Vitamin D deficiency, unspecified: Principal | ICD-10-CM

## 2017-05-10 DIAGNOSIS — K7469 Other cirrhosis of liver: Principal | ICD-10-CM

## 2017-05-10 LAB — IMMUNOGLOBULIN G (IGG): Lab: 198 mg/dL — ABNORMAL HIGH (ref 762–1488)

## 2017-05-10 MED ORDER — RIFAXIMIN 550 MG PO TAB
550 mg | ORAL_TABLET | Freq: Two times a day (BID) | ORAL | 11 refills | 30.00000 days | Status: AC
Start: 2017-05-10 — End: 2018-05-17
  Filled 2017-07-16 (×2): qty 60, 30d supply, fill #1

## 2017-05-10 MED ORDER — ERGOCALCIFEROL (VITAMIN D2) 50,000 UNIT PO CAP
1 | ORAL_CAPSULE | ORAL | 0 refills | 56.00000 days | Status: AC
Start: 2017-05-10 — End: ?

## 2017-05-14 ENCOUNTER — Encounter: Admit: 2017-05-14 | Discharge: 2017-05-14 | Payer: MEDICARE

## 2017-05-15 ENCOUNTER — Encounter: Admit: 2017-05-15 | Discharge: 2017-05-15 | Payer: MEDICARE

## 2017-05-16 ENCOUNTER — Encounter: Admit: 2017-05-16 | Discharge: 2017-05-16 | Payer: MEDICARE

## 2017-05-16 ENCOUNTER — Ambulatory Visit: Admit: 2017-05-16 | Discharge: 2017-05-17 | Payer: MEDICARE

## 2017-05-16 ENCOUNTER — Ambulatory Visit: Admit: 2017-05-16 | Discharge: 2017-05-16 | Payer: MEDICARE

## 2017-05-16 DIAGNOSIS — C228 Malignant neoplasm of liver, primary, unspecified as to type: ICD-10-CM

## 2017-05-16 DIAGNOSIS — K754 Autoimmune hepatitis: Principal | ICD-10-CM

## 2017-05-16 MED ORDER — SODIUM CHLORIDE 0.9 % IJ SOLN
50 mL | Freq: Once | INTRAVENOUS | 0 refills | Status: CP
Start: 2017-05-16 — End: ?
  Administered 2017-05-16: 23:00:00 50 mL via INTRAVENOUS

## 2017-05-16 MED ORDER — GADOXETATE 0.25 MMOL/ML (181.43 MG/ML) IV SOLN
10 mL | Freq: Once | INTRAVENOUS | 0 refills | Status: CP
Start: 2017-05-16 — End: ?
  Administered 2017-05-16: 23:00:00 10 mL via INTRAVENOUS

## 2017-05-17 ENCOUNTER — Encounter: Admit: 2017-05-17 | Discharge: 2017-05-17 | Payer: MEDICARE

## 2017-05-18 ENCOUNTER — Ambulatory Visit: Admit: 2017-05-18 | Discharge: 2017-05-18 | Payer: MEDICARE

## 2017-05-18 ENCOUNTER — Encounter: Admit: 2017-05-18 | Discharge: 2017-05-18 | Payer: MEDICARE

## 2017-05-18 DIAGNOSIS — K5730 Diverticulosis of large intestine without perforation or abscess without bleeding: ICD-10-CM

## 2017-05-18 DIAGNOSIS — Z1211 Encounter for screening for malignant neoplasm of colon: ICD-10-CM

## 2017-05-18 DIAGNOSIS — Z89611 Acquired absence of right leg above knee: ICD-10-CM

## 2017-05-18 DIAGNOSIS — Z881 Allergy status to other antibiotic agents status: ICD-10-CM

## 2017-05-18 DIAGNOSIS — K7460 Unspecified cirrhosis of liver: ICD-10-CM

## 2017-05-18 DIAGNOSIS — K210 Gastro-esophageal reflux disease with esophagitis: ICD-10-CM

## 2017-05-18 DIAGNOSIS — Z85828 Personal history of other malignant neoplasm of skin: ICD-10-CM

## 2017-05-18 DIAGNOSIS — R12 Heartburn: ICD-10-CM

## 2017-05-18 DIAGNOSIS — F17210 Nicotine dependence, cigarettes, uncomplicated: ICD-10-CM

## 2017-05-18 DIAGNOSIS — K7581 Nonalcoholic steatohepatitis (NASH): Principal | ICD-10-CM

## 2017-05-18 DIAGNOSIS — I8500 Esophageal varices without bleeding: ICD-10-CM

## 2017-05-18 DIAGNOSIS — K649 Unspecified hemorrhoids: ICD-10-CM

## 2017-05-18 DIAGNOSIS — C801 Malignant (primary) neoplasm, unspecified: Principal | ICD-10-CM

## 2017-05-18 MED ORDER — LIDOCAINE (PF) 200 MG/10 ML (2 %) IJ SYRG
0 refills | Status: DC
Start: 2017-05-18 — End: 2017-05-18
  Administered 2017-05-18: 15:00:00 80 mg via INTRAVENOUS

## 2017-05-18 MED ORDER — LACTATED RINGERS IV SOLP
1000 mL | INTRAVENOUS | 0 refills | Status: DC
Start: 2017-05-18 — End: 2017-05-18
  Administered 2017-05-18: 15:00:00 1000.000 mL via INTRAVENOUS

## 2017-05-18 MED ORDER — PROPOFOL 10 MG/ML IV EMUL 20 ML (INFUSION)(AM)(OR)
INTRAVENOUS | 0 refills | Status: DC
Start: 2017-05-18 — End: 2017-05-18
  Administered 2017-05-18: 16:00:00 125 ug/kg/min via INTRAVENOUS

## 2017-05-19 ENCOUNTER — Encounter: Admit: 2017-05-19 | Discharge: 2017-05-19 | Payer: MEDICARE

## 2017-05-19 DIAGNOSIS — C801 Malignant (primary) neoplasm, unspecified: Principal | ICD-10-CM

## 2017-05-21 ENCOUNTER — Encounter: Admit: 2017-05-21 | Discharge: 2017-05-21 | Payer: MEDICARE

## 2017-05-22 ENCOUNTER — Encounter: Admit: 2017-05-22 | Discharge: 2017-05-22 | Payer: MEDICARE

## 2017-05-22 DIAGNOSIS — K7460 Unspecified cirrhosis of liver: ICD-10-CM

## 2017-05-22 DIAGNOSIS — K7469 Other cirrhosis of liver: Principal | ICD-10-CM

## 2017-05-22 LAB — COMPREHENSIVE METABOLIC PANEL
Lab: 10
Lab: 34
Lab: 41
Lab: 7.1
Lab: 0.7 mg/dL
Lab: 1 mg/dL
Lab: 12 %
Lab: 134 mmol/L — ABNORMAL LOW (ref 136–145)
Lab: 150
Lab: 2.9 — ABNORMAL LOW (ref 3.4–4.8)
Lab: 26 %
Lab: 79
Lab: 95

## 2017-05-22 LAB — CBC AND DIFF
Lab: 0.1
Lab: 0.1
Lab: 13 — ABNORMAL HIGH (ref 4.8–10.8)
Lab: 3
Lab: 0.7
Lab: 13
Lab: 3.8 — ABNORMAL LOW (ref 4.20–5.40)
Lab: 9.1 — ABNORMAL HIGH (ref 1.9–8.1)

## 2017-05-22 LAB — PROTIME INR (PT)
Lab: 1.1 ratio (ref 9–46)
Lab: 12 % — ABNORMAL HIGH (ref 9.9–12.1)

## 2017-05-24 ENCOUNTER — Ambulatory Visit: Admit: 2017-05-24 | Discharge: 2017-05-24 | Payer: MEDICARE

## 2017-05-24 ENCOUNTER — Encounter: Admit: 2017-05-24 | Discharge: 2017-05-24 | Payer: MEDICARE

## 2017-05-24 DIAGNOSIS — K754 Autoimmune hepatitis: Principal | ICD-10-CM

## 2017-05-24 DIAGNOSIS — C220 Liver cell carcinoma: ICD-10-CM

## 2017-05-24 DIAGNOSIS — C228 Malignant neoplasm of liver, primary, unspecified as to type: ICD-10-CM

## 2017-05-24 MED ORDER — SODIUM CHLORIDE 0.9 % IJ SOLN
50 mL | Freq: Once | INTRAVENOUS | 0 refills | Status: CP
Start: 2017-05-24 — End: ?
  Administered 2017-05-24: 15:00:00 50 mL via INTRAVENOUS

## 2017-05-24 MED ORDER — GADOBENATE DIMEGLUMINE 529 MG/ML (0.1MMOL/0.2ML) IV SOLN
16 mL | Freq: Once | INTRAVENOUS | 0 refills | Status: CP
Start: 2017-05-24 — End: ?
  Administered 2017-05-24: 15:00:00 16 mL via INTRAVENOUS

## 2017-05-27 ENCOUNTER — Encounter: Admit: 2017-05-27 | Discharge: 2017-05-27 | Payer: MEDICARE

## 2017-05-27 DIAGNOSIS — C801 Malignant (primary) neoplasm, unspecified: Principal | ICD-10-CM

## 2017-05-28 ENCOUNTER — Encounter: Admit: 2017-05-28 | Discharge: 2017-05-28 | Payer: MEDICARE

## 2017-05-28 DIAGNOSIS — C3412 Malignant neoplasm of upper lobe, left bronchus or lung: ICD-10-CM

## 2017-05-28 DIAGNOSIS — C220 Liver cell carcinoma: Principal | ICD-10-CM

## 2017-05-29 ENCOUNTER — Encounter: Admit: 2017-05-29 | Discharge: 2017-05-29 | Payer: MEDICARE

## 2017-05-29 DIAGNOSIS — C229 Malignant neoplasm of liver, not specified as primary or secondary: Principal | ICD-10-CM

## 2017-05-29 MED ORDER — AMOXICILLIN 500 MG PO CAP
1000 mg | ORAL_CAPSULE | Freq: Two times a day (BID) | ORAL | 0 refills | 7.00000 days | Status: AC
Start: 2017-05-29 — End: ?

## 2017-05-29 MED ORDER — CLARITHROMYCIN 500 MG PO TAB
500 mg | ORAL_TABLET | Freq: Two times a day (BID) | ORAL | 0 refills | Status: AC
Start: 2017-05-29 — End: ?

## 2017-05-29 MED ORDER — OMEPRAZOLE 40 MG PO CPDR
40 mg | ORAL_CAPSULE | Freq: Two times a day (BID) | ORAL | 0 refills | Status: AC
Start: 2017-05-29 — End: 2017-06-25

## 2017-05-31 ENCOUNTER — Ambulatory Visit: Admit: 2017-05-31 | Discharge: 2017-05-31 | Payer: MEDICARE

## 2017-05-31 ENCOUNTER — Encounter: Admit: 2017-05-31 | Discharge: 2017-05-31 | Payer: MEDICARE

## 2017-05-31 DIAGNOSIS — R590 Localized enlarged lymph nodes: Principal | ICD-10-CM

## 2017-05-31 DIAGNOSIS — K759 Inflammatory liver disease, unspecified: ICD-10-CM

## 2017-05-31 DIAGNOSIS — D899 Disorder involving the immune mechanism, unspecified: ICD-10-CM

## 2017-05-31 DIAGNOSIS — C7651 Malignant neoplasm of right lower limb: ICD-10-CM

## 2017-05-31 DIAGNOSIS — C229 Malignant neoplasm of liver, not specified as primary or secondary: Secondary | ICD-10-CM

## 2017-05-31 DIAGNOSIS — C801 Malignant (primary) neoplasm, unspecified: Principal | ICD-10-CM

## 2017-05-31 MED ORDER — MIDAZOLAM 1 MG/ML IJ SOLN
1-2 mg | Freq: Once | INTRAVENOUS | 0 refills | Status: CP
Start: 2017-05-31 — End: ?
  Administered 2017-05-31: 16:00:00 1 mg via INTRAVENOUS

## 2017-05-31 MED ORDER — FENTANYL CITRATE (PF) 50 MCG/ML IJ SOLN
0 refills | Status: CP
Start: 2017-05-31 — End: ?
  Administered 2017-05-31 (×2): 50 ug via INTRAVENOUS

## 2017-05-31 MED ORDER — MIDAZOLAM 1 MG/ML IJ SOLN
0 refills | Status: CP
Start: 2017-05-31 — End: ?
  Administered 2017-05-31: 16:00:00 1 mg via INTRAVENOUS

## 2017-05-31 MED ORDER — FENTANYL CITRATE (PF) 50 MCG/ML IJ SOLN
25-50 ug | Freq: Once | INTRAVENOUS | 0 refills | Status: CP
Start: 2017-05-31 — End: ?
  Administered 2017-05-31: 16:00:00 50 ug via INTRAVENOUS

## 2017-06-01 ENCOUNTER — Encounter: Admit: 2017-06-01 | Discharge: 2017-06-01 | Payer: MEDICARE

## 2017-06-01 DIAGNOSIS — K769 Liver disease, unspecified: Principal | ICD-10-CM

## 2017-06-01 DIAGNOSIS — C3411 Malignant neoplasm of upper lobe, right bronchus or lung: ICD-10-CM

## 2017-06-04 ENCOUNTER — Encounter: Admit: 2017-06-04 | Discharge: 2017-06-04 | Payer: MEDICARE

## 2017-06-04 DIAGNOSIS — K7460 Unspecified cirrhosis of liver: Principal | ICD-10-CM

## 2017-06-05 ENCOUNTER — Encounter: Admit: 2017-06-05 | Discharge: 2017-06-05 | Payer: MEDICARE

## 2017-06-05 DIAGNOSIS — R911 Solitary pulmonary nodule: Principal | ICD-10-CM

## 2017-06-05 DIAGNOSIS — C220 Liver cell carcinoma: ICD-10-CM

## 2017-06-05 DIAGNOSIS — K769 Liver disease, unspecified: Principal | ICD-10-CM

## 2017-06-06 ENCOUNTER — Encounter: Admit: 2017-06-06 | Discharge: 2017-06-06 | Payer: MEDICARE

## 2017-06-06 DIAGNOSIS — C499 Malignant neoplasm of connective and soft tissue, unspecified: ICD-10-CM

## 2017-06-06 DIAGNOSIS — D899 Disorder involving the immune mechanism, unspecified: ICD-10-CM

## 2017-06-06 DIAGNOSIS — C801 Malignant (primary) neoplasm, unspecified: Principal | ICD-10-CM

## 2017-06-06 DIAGNOSIS — K754 Autoimmune hepatitis: ICD-10-CM

## 2017-06-06 DIAGNOSIS — K759 Inflammatory liver disease, unspecified: ICD-10-CM

## 2017-06-06 DIAGNOSIS — Z87891 Personal history of nicotine dependence: ICD-10-CM

## 2017-06-06 DIAGNOSIS — C220 Liver cell carcinoma: Principal | ICD-10-CM

## 2017-06-06 DIAGNOSIS — R911 Solitary pulmonary nodule: ICD-10-CM

## 2017-06-07 ENCOUNTER — Encounter: Admit: 2017-06-07 | Discharge: 2017-06-07 | Payer: MEDICARE

## 2017-06-07 DIAGNOSIS — K7469 Other cirrhosis of liver: Principal | ICD-10-CM

## 2017-06-07 LAB — COMPREHENSIVE METABOLIC PANEL
Lab: 0.8 mg/dL (ref 0.57–1.11)
Lab: 1 mg/dL (ref 0.0–1.0)
Lab: 12 meq/L (ref 0–14)
Lab: 133 mmol/L — ABNORMAL LOW (ref 136–145)
Lab: 15 mg/dL (ref 9.8–20.1)
Lab: 28 mmol/L (ref 23–31)
Lab: 4.2 mmol/L — ABNORMAL HIGH (ref 3.5–5.1)
Lab: 70 mL/min/{1.73_m2} (ref >59)
Lab: 84 mg/dL (ref 80–115)
Lab: 9.2 mg/dL (ref 8.4–10.2)
Lab: 97 mmol/L — ABNORMAL LOW (ref 98–107)

## 2017-06-07 LAB — PROTIME INR (PT)
Lab: 1.1
Lab: 12 s — ABNORMAL HIGH (ref 9.9–12.1)

## 2017-06-08 ENCOUNTER — Encounter: Admit: 2017-06-08 | Discharge: 2017-06-08 | Payer: MEDICARE

## 2017-06-08 ENCOUNTER — Ambulatory Visit: Admit: 2017-06-08 | Discharge: 2017-06-08 | Payer: MEDICARE

## 2017-06-08 DIAGNOSIS — C3412 Malignant neoplasm of upper lobe, left bronchus or lung: Secondary | ICD-10-CM

## 2017-06-08 DIAGNOSIS — C220 Liver cell carcinoma: Principal | ICD-10-CM

## 2017-06-08 DIAGNOSIS — K7581 Nonalcoholic steatohepatitis (NASH): Principal | ICD-10-CM

## 2017-06-08 LAB — POC GLUCOSE: Lab: 173 mg/dL — ABNORMAL HIGH (ref 70–100)

## 2017-06-08 MED ORDER — RP DX F-18 FDG MCI
10 | Freq: Once | INTRAVENOUS | 0 refills | Status: CP
Start: 2017-06-08 — End: ?
  Administered 2017-06-08: 20:00:00 11.5 via INTRAVENOUS

## 2017-06-11 ENCOUNTER — Encounter: Admit: 2017-06-11 | Discharge: 2017-06-11 | Payer: MEDICARE

## 2017-06-11 DIAGNOSIS — K7581 Nonalcoholic steatohepatitis (NASH): Principal | ICD-10-CM

## 2017-06-11 DIAGNOSIS — C220 Liver cell carcinoma: ICD-10-CM

## 2017-06-11 LAB — CBC AND DIFF
Lab: 0.1 10*3/uL (ref 0.0–0.2)
Lab: 0.1 10*3/uL (ref 0.0–0.6)
Lab: 0.5 10*3/uL (ref 0.1–0.9)
Lab: 0.8 % (ref 0.0–2.0)
Lab: 1.3 % (ref 0.0–6.0)
Lab: 101 fL — ABNORMAL HIGH (ref 80.0–99.0)
Lab: 12 % (ref 11.5–14.5)
Lab: 13 g/dL (ref 12.0–16.0)
Lab: 151 10*3/uL (ref 130–400)
Lab: 2.4 10*3/uL (ref 0.9–5.1)
Lab: 25 % (ref 18.0–47.0)
Lab: 3.9 10*6/uL — ABNORMAL LOW (ref 4.20–5.40)
Lab: 33 g/dL (ref 33.0–37.0)
Lab: 39 % (ref 37.0–47.0)
Lab: 5 % (ref 0.0–10.0)
Lab: 6.2 10*3/uL (ref 1.9–8.1)
Lab: 67 % (ref 40.0–75.0)
Lab: 9.2 10*3/uL (ref 4.8–10.8)

## 2017-06-14 ENCOUNTER — Encounter: Admit: 2017-06-14 | Discharge: 2017-06-14 | Payer: MEDICARE

## 2017-06-14 ENCOUNTER — Ambulatory Visit: Admit: 2017-06-14 | Discharge: 2017-06-14 | Payer: MEDICARE

## 2017-06-14 DIAGNOSIS — D899 Disorder involving the immune mechanism, unspecified: ICD-10-CM

## 2017-06-14 DIAGNOSIS — K759 Inflammatory liver disease, unspecified: ICD-10-CM

## 2017-06-14 DIAGNOSIS — K754 Autoimmune hepatitis: ICD-10-CM

## 2017-06-14 DIAGNOSIS — C220 Liver cell carcinoma: ICD-10-CM

## 2017-06-14 DIAGNOSIS — C3411 Malignant neoplasm of upper lobe, right bronchus or lung: ICD-10-CM

## 2017-06-14 DIAGNOSIS — C499 Malignant neoplasm of connective and soft tissue, unspecified: ICD-10-CM

## 2017-06-14 DIAGNOSIS — C801 Malignant (primary) neoplasm, unspecified: Principal | ICD-10-CM

## 2017-06-14 DIAGNOSIS — R911 Solitary pulmonary nodule: Principal | ICD-10-CM

## 2017-06-14 DIAGNOSIS — R918 Other nonspecific abnormal finding of lung field: ICD-10-CM

## 2017-06-15 ENCOUNTER — Ambulatory Visit: Admit: 2017-06-15 | Discharge: 2017-06-15 | Payer: MEDICARE

## 2017-06-15 ENCOUNTER — Encounter: Admit: 2017-06-15 | Discharge: 2017-06-15 | Payer: MEDICARE

## 2017-06-15 ENCOUNTER — Ambulatory Visit: Admit: 2017-06-15 | Discharge: 2017-06-16 | Payer: MEDICARE

## 2017-06-15 DIAGNOSIS — K5790 Diverticulosis of intestine, part unspecified, without perforation or abscess without bleeding: ICD-10-CM

## 2017-06-15 DIAGNOSIS — K754 Autoimmune hepatitis: Principal | ICD-10-CM

## 2017-06-15 DIAGNOSIS — K7581 Nonalcoholic steatohepatitis (NASH): ICD-10-CM

## 2017-06-15 DIAGNOSIS — K219 Gastro-esophageal reflux disease without esophagitis: ICD-10-CM

## 2017-06-15 DIAGNOSIS — R911 Solitary pulmonary nodule: ICD-10-CM

## 2017-06-15 DIAGNOSIS — C220 Liver cell carcinoma: ICD-10-CM

## 2017-06-15 DIAGNOSIS — C499 Malignant neoplasm of connective and soft tissue, unspecified: ICD-10-CM

## 2017-06-15 DIAGNOSIS — D899 Disorder involving the immune mechanism, unspecified: ICD-10-CM

## 2017-06-15 DIAGNOSIS — K759 Inflammatory liver disease, unspecified: ICD-10-CM

## 2017-06-15 DIAGNOSIS — C4402 Squamous cell carcinoma of skin of lip: ICD-10-CM

## 2017-06-15 DIAGNOSIS — J439 Emphysema, unspecified: ICD-10-CM

## 2017-06-15 DIAGNOSIS — C801 Malignant (primary) neoplasm, unspecified: Principal | ICD-10-CM

## 2017-06-19 ENCOUNTER — Encounter: Admit: 2017-06-19 | Discharge: 2017-06-19 | Payer: MEDICARE

## 2017-06-19 DIAGNOSIS — C4402 Squamous cell carcinoma of skin of lip: ICD-10-CM

## 2017-06-19 DIAGNOSIS — K7581 Nonalcoholic steatohepatitis (NASH): ICD-10-CM

## 2017-06-19 DIAGNOSIS — K219 Gastro-esophageal reflux disease without esophagitis: ICD-10-CM

## 2017-06-19 DIAGNOSIS — K759 Inflammatory liver disease, unspecified: ICD-10-CM

## 2017-06-19 DIAGNOSIS — C220 Liver cell carcinoma: ICD-10-CM

## 2017-06-19 DIAGNOSIS — C499 Malignant neoplasm of connective and soft tissue, unspecified: ICD-10-CM

## 2017-06-19 DIAGNOSIS — C801 Malignant (primary) neoplasm, unspecified: Principal | ICD-10-CM

## 2017-06-19 DIAGNOSIS — J439 Emphysema, unspecified: ICD-10-CM

## 2017-06-19 DIAGNOSIS — D899 Disorder involving the immune mechanism, unspecified: ICD-10-CM

## 2017-06-19 DIAGNOSIS — R911 Solitary pulmonary nodule: ICD-10-CM

## 2017-06-19 DIAGNOSIS — K5790 Diverticulosis of intestine, part unspecified, without perforation or abscess without bleeding: ICD-10-CM

## 2017-06-20 ENCOUNTER — Encounter: Admit: 2017-06-20 | Discharge: 2017-06-20 | Payer: MEDICARE

## 2017-06-20 DIAGNOSIS — K7469 Other cirrhosis of liver: Principal | ICD-10-CM

## 2017-06-20 LAB — CBC
Lab: 12 % (ref 11.5–14.5)
Lab: 14 g/dL (ref 12.0–16.0)
Lab: 192 x10-3/uL (ref 130–400)
Lab: 34 g/dL — ABNORMAL LOW (ref >59)
Lab: 34 pg — ABNORMAL HIGH (ref 27.0–31.0)
Lab: 4.2 mmol/L — ABNORMAL LOW (ref 4.20–5.40)
Lab: 9.9 x10-3/uL (ref 4.8–10.8)
Lab: 99 fL — ABNORMAL HIGH (ref 80.0–99.0)

## 2017-06-20 LAB — COMPREHENSIVE METABOLIC PANEL
Lab: 1.4 mg/dL — ABNORMAL HIGH (ref 0.0–1.0)
Lab: 132 mmol/L — ABNORMAL LOW (ref 136–145)
Lab: 142 U/L (ref 40–150)
Lab: 3.2 g/dL — ABNORMAL LOW (ref 3.4–4.8)
Lab: 42 U/L — ABNORMAL HIGH (ref 5–34)
Lab: 52 U/L (ref 0–55)
Lab: 7.7 g/dL (ref 6.2–8.1)

## 2017-06-20 LAB — PROTIME INR (PT): Lab: 12 s — ABNORMAL HIGH (ref 9.9–12.1)

## 2017-06-21 ENCOUNTER — Encounter: Admit: 2017-06-21 | Discharge: 2017-06-21 | Payer: MEDICARE

## 2017-06-22 ENCOUNTER — Encounter: Admit: 2017-06-22 | Discharge: 2017-06-22 | Payer: MEDICARE

## 2017-06-25 ENCOUNTER — Encounter: Admit: 2017-06-25 | Discharge: 2017-06-25 | Payer: MEDICARE

## 2017-06-25 MED ORDER — OMEPRAZOLE 40 MG PO CPDR
ORAL_CAPSULE | Freq: Two times a day (BID) | 3 refills | Status: AC
Start: 2017-06-25 — End: 2017-08-08

## 2017-06-29 ENCOUNTER — Encounter: Admit: 2017-06-29 | Discharge: 2017-06-29 | Payer: MEDICARE

## 2017-07-05 ENCOUNTER — Encounter: Admit: 2017-07-05 | Discharge: 2017-07-05 | Payer: MEDICARE

## 2017-07-06 ENCOUNTER — Encounter: Admit: 2017-07-06 | Discharge: 2017-07-06 | Payer: MEDICARE

## 2017-07-06 MED ORDER — FAMOTIDINE (PF) 20 MG/2 ML IV SOLN
20 mg | Freq: Once | INTRAVENOUS | 0 refills | Status: CN
Start: 2017-07-06 — End: ?

## 2017-07-06 MED ORDER — ONDANSETRON/DEXAMETHASONE IVPB
Freq: Once | INTRAVENOUS | 0 refills | Status: CN
Start: 2017-07-06 — End: ?

## 2017-07-06 MED ORDER — DOXORUBICIN/IOHEXOL/DRUG ELUTING BEADS (LC LUMI 70-150) EMBOLIZATION
Freq: Once | INTRA_ARTERIAL | 0 refills | Status: CN
Start: 2017-07-06 — End: ?

## 2017-07-06 MED ORDER — DEXTROSE 5%-0.45% SODIUM CHLORIDE IV SOLP
INTRAVENOUS | 0 refills | Status: CN
Start: 2017-07-06 — End: ?

## 2017-07-09 ENCOUNTER — Encounter: Admit: 2017-07-09 | Discharge: 2017-07-09 | Payer: MEDICARE

## 2017-07-09 ENCOUNTER — Ambulatory Visit: Admit: 2017-07-09 | Discharge: 2017-07-09 | Payer: MEDICARE

## 2017-07-09 DIAGNOSIS — C220 Liver cell carcinoma: Principal | ICD-10-CM

## 2017-07-09 DIAGNOSIS — R911 Solitary pulmonary nodule: ICD-10-CM

## 2017-07-09 LAB — POC GLUCOSE
Lab: 261 mg/dL — ABNORMAL HIGH (ref 70–100)
Lab: 170 mg/dL — ABNORMAL HIGH (ref 70–100)

## 2017-07-09 MED ORDER — FENTANYL CITRATE (PF) 50 MCG/ML IJ SOLN
50 ug | Freq: Once | INTRAVENOUS | 0 refills | Status: CP
Start: 2017-07-09 — End: ?
  Administered 2017-07-09: 16:00:00 50 ug via INTRAVENOUS

## 2017-07-09 MED ORDER — PROPOFOL INJ 10 MG/ML IV VIAL
0 refills | Status: DC
Start: 2017-07-09 — End: 2017-07-09
  Administered 2017-07-09: 19:00:00 10 mg via INTRAVENOUS

## 2017-07-09 MED ORDER — ASPIRIN 325 MG PO TBEC
325 mg | ORAL_TABLET | Freq: Every day | ORAL | 1 refills | 30.00000 days | Status: AC
Start: 2017-07-09 — End: ?

## 2017-07-09 MED ORDER — ASPIRIN 81 MG PO TBEC
81 mg | Freq: Every day | ORAL | 0 refills | Status: DC
Start: 2017-07-09 — End: 2017-07-09

## 2017-07-09 MED ORDER — KETOROLAC 30 MG/ML (1 ML) IJ SOLN
15 mg | Freq: Once | INTRAVENOUS | 0 refills | Status: CP
Start: 2017-07-09 — End: ?
  Administered 2017-07-09: 22:00:00 15 mg via INTRAVENOUS

## 2017-07-09 MED ORDER — IODIXANOL 270 MG IODINE/ML IV SOLN
225 mL | Freq: Once | INTRA_ARTERIAL | 0 refills | Status: CP
Start: 2017-07-09 — End: ?
  Administered 2017-07-09: 18:00:00 225 mL via INTRA_ARTERIAL

## 2017-07-09 MED ORDER — AMPICILLIN/SULBACTAM 3G/100ML NS IVPB (MB+)
3 g | Freq: Once | INTRAVENOUS | 0 refills | Status: AC
Start: 2017-07-09 — End: ?

## 2017-07-09 MED ORDER — DOXORUBICIN/IOHEXOL/DRUG ELUTING BEADS (LC LUMI 70-150) EMBOLIZATION
Freq: Once | INTRA_ARTERIAL | 0 refills | Status: CP
Start: 2017-07-09 — End: ?

## 2017-07-09 MED ORDER — CLOPIDOGREL 75 MG PO TAB
75 mg | ORAL_TABLET | Freq: Every day | ORAL | 1 refills | 90.00000 days | Status: AC
Start: 2017-07-09 — End: ?
  Filled 2017-07-09: qty 90, 90d supply, fill #1

## 2017-07-09 MED ORDER — ASPIRIN 325 MG PO TAB
325 mg | ORAL_TABLET | Freq: Every day | ORAL | 1 refills | 30.00000 days | Status: AC
Start: 2017-07-09 — End: 2017-07-09

## 2017-07-09 MED ORDER — ASPIRIN 325 MG PO TAB
325 mg | Freq: Every day | ORAL | 0 refills | Status: DC
Start: 2017-07-09 — End: 2017-07-23
  Administered 2017-07-09: 325 mg via ORAL

## 2017-07-09 MED ORDER — LIDOCAINE (PF) 200 MG/10 ML (2 %) IJ SYRG
0 refills | Status: DC
Start: 2017-07-09 — End: 2017-07-09
  Administered 2017-07-09: 19:00:00 80 mg via INTRAVENOUS

## 2017-07-09 MED ORDER — DEXTRAN 70-HYPROMELLOSE (PF) 0.1-0.3 % OP DPET
0 refills | Status: DC
Start: 2017-07-09 — End: 2017-07-09
  Administered 2017-07-09: 19:00:00 2 [drp] via OPHTHALMIC

## 2017-07-09 MED ORDER — ONDANSETRON HCL 4 MG PO TAB
4 mg | ORAL_TABLET | ORAL | 1 refills | 8.00000 days | Status: AC | PRN
Start: 2017-07-09 — End: 2017-08-08
  Filled 2017-07-09 (×2): qty 20, 5d supply, fill #1

## 2017-07-09 MED ORDER — FENTANYL CITRATE (PF) 50 MCG/ML IJ SOLN
25 ug | INTRAVENOUS | 0 refills | Status: CN | PRN
Start: 2017-07-09 — End: ?

## 2017-07-09 MED ORDER — MIDAZOLAM 1 MG/ML IJ SOLN
1-2 mg | Freq: Once | INTRAVENOUS | 0 refills | Status: CP
Start: 2017-07-09 — End: ?
  Administered 2017-07-09: 16:00:00 1 mg via INTRAVENOUS

## 2017-07-09 MED ORDER — DEXTROSE 5%-0.45% SODIUM CHLORIDE IV SOLP
INTRAVENOUS | 0 refills | Status: DC
Start: 2017-07-09 — End: 2017-07-10
  Administered 2017-07-09: 15:00:00 1000.000 mL via INTRAVENOUS

## 2017-07-09 MED ORDER — LACTATED RINGERS IV SOLP
0 refills | Status: DC
Start: 2017-07-09 — End: 2017-07-09
  Administered 2017-07-09: 19:00:00 via INTRAVENOUS

## 2017-07-09 MED ORDER — FENTANYL CITRATE (PF) 50 MCG/ML IJ SOLN
0 refills | Status: CP
Start: 2017-07-09 — End: ?
  Administered 2017-07-09 (×2): 50 ug via INTRAVENOUS

## 2017-07-09 MED ORDER — DOXORUBICIN/IOHEXOL/DRUG ELUTING BEADS (LC LUMI 70-150) EMBOLIZATION
Freq: Once | INTRA_ARTERIAL | 0 refills | Status: DC
Start: 2017-07-09 — End: 2017-07-10

## 2017-07-09 MED ORDER — FENTANYL CITRATE (PF) 50 MCG/ML IJ SOLN
0 refills | Status: DC
Start: 2017-07-09 — End: 2017-07-09
  Administered 2017-07-09: 19:00:00 50 ug via INTRAVENOUS
  Administered 2017-07-09 (×2): 25 ug via INTRAVENOUS

## 2017-07-09 MED ORDER — CLOPIDOGREL 75 MG PO TAB
75 mg | ORAL_TABLET | Freq: Every day | ORAL | 1 refills | 90.00000 days | Status: AC
Start: 2017-07-09 — End: 2017-07-09
  Filled 2017-07-09: qty 90, 90d supply, fill #1

## 2017-07-09 MED ORDER — CLOPIDOGREL 75 MG PO TAB
75 mg | Freq: Every day | ORAL | 0 refills | Status: DC
Start: 2017-07-09 — End: 2017-07-23
  Administered 2017-07-09: 75 mg via ORAL

## 2017-07-09 MED ORDER — SUCCINYLCHOLINE CHLORIDE 20 MG/ML IJ SOLN
INTRAVENOUS | 0 refills | Status: DC
Start: 2017-07-09 — End: 2017-07-09
  Administered 2017-07-09: 19:00:00 100 mg via INTRAVENOUS

## 2017-07-09 MED ORDER — GLYCOPYRROLATE 0.2 MG/ML IJ SOLN
0 refills | Status: DC
Start: 2017-07-09 — End: 2017-07-09
  Administered 2017-07-09: 20:00:00 .1 mg via INTRAVENOUS

## 2017-07-09 MED ORDER — EPHEDRINE SULFATE 50 MG/5ML SYR (10 MG/ML) (AN)(OSM)
0 refills | Status: DC
Start: 2017-07-09 — End: 2017-07-09

## 2017-07-09 MED ORDER — EPHEDRINE SULFATE 50 MG/5ML SYR (10 MG/ML) (AN)(OSM)
0 refills | Status: DC
Start: 2017-07-09 — End: 2017-07-09
  Administered 2017-07-09 (×2): 10 mg via INTRAVENOUS

## 2017-07-09 MED ORDER — NITROGLYCERIN(#) INJ 100MCG/ML IJ SOLN
0 refills | Status: CP
Start: 2017-07-09 — End: ?
  Administered 2017-07-09 (×5): 100 ug via INTRA_ARTERIAL

## 2017-07-09 MED ORDER — AMPICILLIN/SULBACTAM 3G/100ML NS IVPB (MB+)
3 g | Freq: Once | INTRAVENOUS | 0 refills | Status: CP
Start: 2017-07-09 — End: ?
  Administered 2017-07-09 (×2): 3 g via INTRAVENOUS

## 2017-07-09 MED ORDER — HEPARIN (PORCINE) 1,000 UNIT/ML IJ SOLN
5000 [IU] | Freq: Once | INTRAVENOUS | 0 refills | Status: CP
Start: 2017-07-09 — End: ?
  Administered 2017-07-09: 17:00:00 5000 [IU] via INTRAVENOUS

## 2017-07-09 MED ORDER — MIDAZOLAM 1 MG/ML IJ SOLN
0 refills | Status: CP
Start: 2017-07-09 — End: ?
  Administered 2017-07-09 (×3): 1 mg via INTRAVENOUS

## 2017-07-09 MED ORDER — PHENYLEPHRINE IN 0.9% NACL(PF) 1 MG/10 ML (100 MCG/ML) IV SYRG
INTRAVENOUS | 0 refills | Status: DC
Start: 2017-07-09 — End: 2017-07-09
  Administered 2017-07-09 (×2): 100 ug via INTRAVENOUS
  Administered 2017-07-09 (×2): 150 ug via INTRAVENOUS
  Administered 2017-07-09 (×2): 100 ug via INTRAVENOUS

## 2017-07-09 MED ORDER — CLOPIDOGREL 75 MG PO TAB
75 mg | ORAL_TABLET | Freq: Every day | ORAL | 1 refills | 90.00000 days | Status: AC
Start: 2017-07-09 — End: 2017-07-09

## 2017-07-09 MED ORDER — KETOROLAC 30 MG/ML (1 ML) IJ SOLN
0 refills | Status: CP
Start: 2017-07-09 — End: ?
  Administered 2017-07-09: 18:00:00 15 mg via INTRAVENOUS

## 2017-07-09 MED ORDER — FAMOTIDINE (PF) 20 MG/2 ML IV SOLN
20 mg | Freq: Once | INTRAVENOUS | 0 refills | Status: CP
Start: 2017-07-09 — End: ?
  Administered 2017-07-09: 15:00:00 20 mg via INTRAVENOUS

## 2017-07-09 MED ORDER — ONDANSETRON HCL (PF) 4 MG/2 ML IJ SOLN
4 mg | Freq: Once | INTRAVENOUS | 0 refills | Status: CN | PRN
Start: 2017-07-09 — End: ?

## 2017-07-09 MED ORDER — ONDANSETRON/DEXAMETHASONE IVPB
Freq: Once | INTRAVENOUS | 0 refills | Status: CP
Start: 2017-07-09 — End: ?
  Administered 2017-07-09 (×3): 60.000 mL via INTRAVENOUS

## 2017-07-11 ENCOUNTER — Encounter: Admit: 2017-07-11 | Discharge: 2017-07-11 | Payer: MEDICARE

## 2017-07-12 ENCOUNTER — Encounter: Admit: 2017-07-12 | Discharge: 2017-07-12 | Payer: MEDICARE

## 2017-07-13 ENCOUNTER — Encounter: Admit: 2017-07-13 | Discharge: 2017-07-13 | Payer: MEDICARE

## 2017-07-15 ENCOUNTER — Encounter: Admit: 2017-07-15 | Discharge: 2017-07-15 | Payer: MEDICARE

## 2017-07-16 ENCOUNTER — Observation Stay: Admit: 2017-07-16 | Discharge: 2017-07-16 | Payer: MEDICARE

## 2017-07-16 ENCOUNTER — Encounter: Admit: 2017-07-16 | Discharge: 2017-07-16 | Payer: MEDICARE

## 2017-07-16 DIAGNOSIS — J939 Pneumothorax, unspecified: ICD-10-CM

## 2017-07-16 DIAGNOSIS — R918 Other nonspecific abnormal finding of lung field: ICD-10-CM

## 2017-07-16 LAB — BASIC METABOLIC PANEL
Lab: 0.8 mg/dL (ref 0.4–1.00)
Lab: 101 MMOL/L — ABNORMAL LOW (ref 98–110)
Lab: 11 mg/dL (ref 7–25)
Lab: 132 MMOL/L — ABNORMAL LOW (ref 137–147)
Lab: 26 MMOL/L (ref 21–30)
Lab: 5 pg — ABNORMAL HIGH (ref 3–12)
Lab: 8.7 mg/dL (ref 8.5–10.6)
Lab: 94 mg/dL (ref 70–100)
Lab: >60 mL/min (ref >60)
Lab: >60 mL/min (ref >60)

## 2017-07-16 LAB — PROTIME INR (PT): Lab: 1.2 MMOL/L — ABNORMAL LOW (ref 0.8–1.2)

## 2017-07-16 LAB — CBC: Lab: 7.6 10*3/uL (ref 4.5–11.0)

## 2017-07-16 MED ORDER — PANTOPRAZOLE 40 MG PO TBEC
80 mg | Freq: Every day | ORAL | 0 refills | Status: DC
Start: 2017-07-16 — End: 2017-07-17

## 2017-07-16 MED ORDER — ONDANSETRON HCL (PF) 4 MG/2 ML IJ SOLN
4-8 mg | INTRAVENOUS | 0 refills | Status: DC | PRN
Start: 2017-07-16 — End: 2017-07-25
  Administered 2017-07-19 – 2017-07-25 (×6): 4 mg via INTRAVENOUS

## 2017-07-16 MED ORDER — ACETAMINOPHEN 325 MG PO TAB
650 mg | ORAL | 0 refills | Status: DC | PRN
Start: 2017-07-16 — End: 2017-07-17

## 2017-07-16 MED ORDER — MIDAZOLAM 1 MG/ML IJ SOLN
1 mg | Freq: Once | INTRAVENOUS | 0 refills | Status: CP
Start: 2017-07-16 — End: ?
  Administered 2017-07-16: 23:00:00 1 mg via INTRAVENOUS

## 2017-07-16 MED ORDER — HEPARIN, PORCINE (PF) 5,000 UNIT/0.5 ML IJ SYRG
5000 [IU] | SUBCUTANEOUS | 0 refills | Status: DC
Start: 2017-07-16 — End: 2017-07-26
  Administered 2017-07-17 – 2017-07-26 (×23): 5000 [IU] via SUBCUTANEOUS

## 2017-07-16 MED ORDER — AZATHIOPRINE 50 MG PO TAB
50 mg | Freq: Every day | ORAL | 0 refills | Status: DC
Start: 2017-07-16 — End: 2017-07-17

## 2017-07-16 MED ORDER — POTASSIUM CHLORIDE 20 MEQ PO TBTQ
40-60 meq | ORAL | 0 refills | Status: DC | PRN
Start: 2017-07-16 — End: 2017-07-26
  Administered 2017-07-21: 14:00:00 40 meq via ORAL

## 2017-07-16 MED ORDER — MAGNESIUM HYDROXIDE 2,400 MG/10 ML PO SUSP
10 mL | Freq: Every day | ORAL | 0 refills | Status: DC | PRN
Start: 2017-07-16 — End: 2017-07-17

## 2017-07-16 MED ORDER — FENTANYL CITRATE (PF) 50 MCG/ML IJ SOLN
50 ug | Freq: Once | INTRAVENOUS | 0 refills | Status: CP
Start: 2017-07-16 — End: ?
  Administered 2017-07-16: 23:00:00 50 ug via INTRAVENOUS

## 2017-07-16 MED ORDER — RIFAXIMIN 550 MG PO TAB
550 mg | Freq: Two times a day (BID) | ORAL | 0 refills | Status: DC
Start: 2017-07-16 — End: 2017-07-26
  Administered 2017-07-17 – 2017-07-26 (×20): 550 mg via ORAL

## 2017-07-16 MED ORDER — MELATONIN 3 MG PO TAB
3 mg | Freq: Every evening | ORAL | 0 refills | Status: DC | PRN
Start: 2017-07-16 — End: 2017-07-26
  Administered 2017-07-20 – 2017-07-22 (×3): 3 mg via ORAL

## 2017-07-16 MED ORDER — SPIRONOLACTONE 50 MG PO TAB
50 mg | Freq: Every day | ORAL | 0 refills | Status: DC
Start: 2017-07-16 — End: 2017-07-26
  Administered 2017-07-17 – 2017-07-26 (×9): 50 mg via ORAL

## 2017-07-16 MED ORDER — AZATHIOPRINE 50 MG PO TAB
50 mg | Freq: Every evening | ORAL | 0 refills | Status: DC
Start: 2017-07-16 — End: 2017-07-26
  Administered 2017-07-17 – 2017-07-26 (×9): 50 mg via ORAL

## 2017-07-16 MED ORDER — OXYCODONE 5 MG PO TAB
5-10 mg | ORAL | 0 refills | Status: DC | PRN
Start: 2017-07-16 — End: 2017-07-25
  Administered 2017-07-17 – 2017-07-21 (×18): 5 mg via ORAL

## 2017-07-16 MED ORDER — POTASSIUM CHLORIDE 20 MEQ/15 ML PO LIQD
40-60 meq | NASOGASTRIC | 0 refills | Status: DC | PRN
Start: 2017-07-16 — End: 2017-07-26

## 2017-07-16 MED ORDER — FUROSEMIDE 20 MG PO TAB
20 mg | Freq: Two times a day (BID) | ORAL | 0 refills | Status: DC
Start: 2017-07-16 — End: 2017-07-21
  Administered 2017-07-17 – 2017-07-21 (×9): 20 mg via ORAL

## 2017-07-16 MED ORDER — TRAMADOL 50 MG PO TAB
50-100 mg | ORAL | 0 refills | Status: DC | PRN
Start: 2017-07-16 — End: 2017-07-25
  Administered 2017-07-17 – 2017-07-25 (×12): 50 mg via ORAL

## 2017-07-16 MED ORDER — ASPIRIN 325 MG PO TBEC
325 mg | Freq: Every day | ORAL | 0 refills | Status: DC
Start: 2017-07-16 — End: 2017-07-24
  Administered 2017-07-17 – 2017-07-24 (×8): 325 mg via ORAL

## 2017-07-17 DIAGNOSIS — J939 Pneumothorax, unspecified: Secondary | ICD-10-CM

## 2017-07-17 LAB — BASIC METABOLIC PANEL
Lab: 130 MMOL/L — ABNORMAL LOW (ref >60)
Lab: 99 MMOL/L — ABNORMAL HIGH (ref >60)

## 2017-07-17 LAB — PROTIME INR (PT): Lab: 1.2 M/UL — ABNORMAL LOW (ref >60)

## 2017-07-17 LAB — CBC: Lab: 6.9 K/UL — ABNORMAL LOW (ref 4.5–11.0)

## 2017-07-17 MED ORDER — POLYETHYLENE GLYCOL 3350 17 GRAM PO PWPK
1 | Freq: Two times a day (BID) | ORAL | 0 refills | Status: DC
Start: 2017-07-17 — End: 2017-07-26
  Administered 2017-07-17 – 2017-07-19 (×4): 17 g via ORAL

## 2017-07-17 MED ORDER — MAGNESIUM HYDROXIDE 2,400 MG/10 ML PO SUSP
10 mL | Freq: Two times a day (BID) | ORAL | 0 refills | Status: DC | PRN
Start: 2017-07-17 — End: 2017-07-26
  Administered 2017-07-19: 01:00:00 10 mL via ORAL

## 2017-07-17 MED ORDER — CALCIUM CARBONATE 200 MG CALCIUM (500 MG) PO CHEW
500 mg | ORAL | 0 refills | Status: DC | PRN
Start: 2017-07-17 — End: 2017-07-26
  Administered 2017-07-17 (×2): 500 mg via ORAL

## 2017-07-17 MED ORDER — PANTOPRAZOLE 40 MG PO TBEC
80 mg | Freq: Two times a day (BID) | ORAL | 0 refills | Status: DC
Start: 2017-07-17 — End: 2017-07-26
  Administered 2017-07-18 – 2017-07-26 (×17): 80 mg via ORAL

## 2017-07-17 MED ORDER — ACETAMINOPHEN 500 MG PO TAB
1000 mg | ORAL | 0 refills | Status: DC | PRN
Start: 2017-07-17 — End: 2017-07-26

## 2017-07-17 MED ORDER — LACTULOSE 10 GRAM/15 ML PO SOLN
30 mL | Freq: Every day | ORAL | 0 refills | Status: DC | PRN
Start: 2017-07-17 — End: 2017-07-19
  Administered 2017-07-17 – 2017-07-18 (×2): 20 g via ORAL

## 2017-07-17 MED ORDER — ALUM-MAG HYDROXIDE-SIMETH 200-200-20 MG/5 ML PO SUSP
30 mL | ORAL | 0 refills | Status: DC | PRN
Start: 2017-07-17 — End: 2017-07-25
  Administered 2017-07-17: 20:00:00 30 mL via ORAL

## 2017-07-18 ENCOUNTER — Encounter: Admit: 2017-07-18 | Discharge: 2017-07-18 | Payer: MEDICARE

## 2017-07-18 ENCOUNTER — Inpatient Hospital Stay: Admit: 2017-07-18 | Discharge: 2017-07-18 | Payer: MEDICARE

## 2017-07-18 DIAGNOSIS — J939 Pneumothorax, unspecified: Secondary | ICD-10-CM

## 2017-07-18 LAB — CBC: Lab: 6 K/UL — ABNORMAL HIGH (ref >60)

## 2017-07-18 LAB — BASIC METABOLIC PANEL: Lab: 129 MMOL/L — ABNORMAL LOW (ref >60)

## 2017-07-18 LAB — PROTIME INR (PT): Lab: 1.1 M/UL — ABNORMAL LOW (ref 0.8–1.2)

## 2017-07-18 MED ORDER — LACTULOSE 10 GRAM/15 ML PO SOLN
30 mL | Freq: Three times a day (TID) | ORAL | 0 refills | Status: DC
Start: 2017-07-18 — End: 2017-07-20
  Administered 2017-07-19 – 2017-07-20 (×4): 20 g via ORAL

## 2017-07-18 MED ORDER — ONDANSETRON HCL (PF) 4 MG/2 ML IJ SOLN
4 mg | Freq: Once | INTRAVENOUS | 0 refills | Status: CP
Start: 2017-07-18 — End: ?
  Administered 2017-07-19: 05:00:00 4 mg via INTRAVENOUS

## 2017-07-19 ENCOUNTER — Encounter: Admit: 2017-07-19 | Discharge: 2017-07-19 | Payer: MEDICARE

## 2017-07-19 DIAGNOSIS — J939 Pneumothorax, unspecified: Secondary | ICD-10-CM

## 2017-07-19 LAB — BASIC METABOLIC PANEL: Lab: 130 MMOL/L — ABNORMAL LOW (ref >60)

## 2017-07-19 LAB — CBC: Lab: 6.7 K/UL — ABNORMAL LOW (ref >60)

## 2017-07-19 LAB — PROTIME INR (PT): Lab: 1.1 MMOL/L (ref 0.8–1.2)

## 2017-07-19 LAB — PLAVIX RESISTANCE (PLATELETWORKS): Lab: 53 % — ABNORMAL HIGH (ref 0–15)

## 2017-07-20 ENCOUNTER — Encounter: Admit: 2017-07-20 | Discharge: 2017-07-20 | Payer: MEDICARE

## 2017-07-20 ENCOUNTER — Inpatient Hospital Stay: Admit: 2017-07-20 | Discharge: 2017-07-20 | Payer: MEDICARE

## 2017-07-20 LAB — BASIC METABOLIC PANEL: Lab: 131 MMOL/L — ABNORMAL LOW (ref 137–147)

## 2017-07-20 LAB — PROTIME INR (PT): Lab: 1.2 M/UL — ABNORMAL LOW (ref 0.8–1.2)

## 2017-07-20 LAB — CBC: Lab: 5.9 10*3/uL (ref 4.5–11.0)

## 2017-07-20 MED ORDER — LACTULOSE 10 GRAM/15 ML PO SOLN
30 mL | Freq: Every day | ORAL | 0 refills | Status: DC | PRN
Start: 2017-07-20 — End: 2017-07-25
  Administered 2017-07-20 – 2017-07-25 (×6): 20 g via ORAL

## 2017-07-21 DIAGNOSIS — J939 Pneumothorax, unspecified: Secondary | ICD-10-CM

## 2017-07-21 LAB — BASIC METABOLIC PANEL: Lab: 131 MMOL/L — ABNORMAL LOW (ref 137–147)

## 2017-07-21 LAB — CBC: Lab: 6.4 K/UL — ABNORMAL HIGH (ref 4.5–11.0)

## 2017-07-21 LAB — PROTIME INR (PT): Lab: 1.1 MMOL/L — ABNORMAL LOW (ref 0.8–1.2)

## 2017-07-21 MED ORDER — FUROSEMIDE 20 MG PO TAB
20 mg | Freq: Two times a day (BID) | ORAL | 0 refills | Status: DC
Start: 2017-07-21 — End: 2017-07-26
  Administered 2017-07-22 – 2017-07-26 (×8): 20 mg via ORAL

## 2017-07-22 DIAGNOSIS — J939 Pneumothorax, unspecified: Secondary | ICD-10-CM

## 2017-07-22 LAB — PROTIME INR (PT): Lab: 1.1 (ref 0.8–1.2)

## 2017-07-22 LAB — BASIC METABOLIC PANEL: Lab: 133 MMOL/L — ABNORMAL LOW (ref 137–147)

## 2017-07-22 LAB — CBC: Lab: 6.1 10*3/uL — ABNORMAL LOW (ref 4.5–11.0)

## 2017-07-22 LAB — PLAVIX RESISTANCE (PLATELETWORKS): Lab: 40 % — ABNORMAL HIGH (ref 0–15)

## 2017-07-23 ENCOUNTER — Encounter: Admit: 2017-07-23 | Discharge: 2017-07-23 | Payer: MEDICARE

## 2017-07-23 DIAGNOSIS — J939 Pneumothorax, unspecified: Secondary | ICD-10-CM

## 2017-07-23 DIAGNOSIS — K759 Inflammatory liver disease, unspecified: ICD-10-CM

## 2017-07-23 DIAGNOSIS — D899 Disorder involving the immune mechanism, unspecified: ICD-10-CM

## 2017-07-23 DIAGNOSIS — C801 Malignant (primary) neoplasm, unspecified: Principal | ICD-10-CM

## 2017-07-23 DIAGNOSIS — K7581 Nonalcoholic steatohepatitis (NASH): ICD-10-CM

## 2017-07-23 DIAGNOSIS — R911 Solitary pulmonary nodule: ICD-10-CM

## 2017-07-23 DIAGNOSIS — K5790 Diverticulosis of intestine, part unspecified, without perforation or abscess without bleeding: ICD-10-CM

## 2017-07-23 DIAGNOSIS — J439 Emphysema, unspecified: ICD-10-CM

## 2017-07-23 DIAGNOSIS — C220 Liver cell carcinoma: ICD-10-CM

## 2017-07-23 DIAGNOSIS — C4402 Squamous cell carcinoma of skin of lip: ICD-10-CM

## 2017-07-23 DIAGNOSIS — K219 Gastro-esophageal reflux disease without esophagitis: ICD-10-CM

## 2017-07-23 DIAGNOSIS — C499 Malignant neoplasm of connective and soft tissue, unspecified: ICD-10-CM

## 2017-07-23 LAB — PROTIME INR (PT): Lab: 1.2 M/UL — ABNORMAL LOW (ref 0.8–1.2)

## 2017-07-23 LAB — BASIC METABOLIC PANEL
Lab: 0.8 mg/dL (ref 0.4–1.00)
Lab: 12 mg/dL (ref 7–25)
Lab: 131 MMOL/L — ABNORMAL LOW (ref 137–147)
Lab: 3.9 MMOL/L — ABNORMAL LOW (ref 3.5–5.1)
Lab: 32 MMOL/L — ABNORMAL HIGH (ref 21–30)
Lab: 4 pg — ABNORMAL HIGH (ref 3–12)
Lab: 8.8 mg/dL (ref 8.5–10.6)
Lab: 95 MMOL/L — ABNORMAL LOW (ref 98–110)
Lab: 98 mg/dL (ref 70–100)
Lab: >60 mL/min (ref >60)
Lab: >60 mL/min (ref >60)

## 2017-07-23 LAB — URINALYSIS DIPSTICK REFLEX TO CULTURE
Lab: NEGATIVE
Lab: NEGATIVE
Lab: NEGATIVE
Lab: NEGATIVE
Lab: NEGATIVE
Lab: NEGATIVE

## 2017-07-23 LAB — CBC: Lab: 5.3 10*3/uL (ref 4.5–11.0)

## 2017-07-23 LAB — PHOSPHORUS: Lab: 4.2 mg/dL (ref 2.0–4.5)

## 2017-07-23 LAB — MAGNESIUM: Lab: 1.9 mg/dL (ref 1.6–2.6)

## 2017-07-23 LAB — URINALYSIS MICROSCOPIC REFLEX TO CULTURE

## 2017-07-23 LAB — PLAVIX RESISTANCE (PLATELETWORKS): Lab: 30 % — ABNORMAL HIGH (ref 0–15)

## 2017-07-23 MED ORDER — POTASSIUM CHLORIDE IN WATER 10 MEQ/50 ML IV PGBK
10 meq | INTRAVENOUS | 0 refills | Status: CP
Start: 2017-07-23 — End: ?
  Administered 2017-07-23 – 2017-07-24 (×4): 10 meq via INTRAVENOUS

## 2017-07-23 MED ADMIN — SODIUM CHLORIDE 0.9 % IV SOLP [27838]: 250 mL | INTRAVENOUS | @ 22:00:00 | Stop: 2017-07-23 | NDC 00338004902

## 2017-07-24 ENCOUNTER — Encounter: Admit: 2017-07-24 | Discharge: 2017-07-24 | Payer: MEDICARE

## 2017-07-24 DIAGNOSIS — C499 Malignant neoplasm of connective and soft tissue, unspecified: ICD-10-CM

## 2017-07-24 DIAGNOSIS — K759 Inflammatory liver disease, unspecified: ICD-10-CM

## 2017-07-24 DIAGNOSIS — K5790 Diverticulosis of intestine, part unspecified, without perforation or abscess without bleeding: ICD-10-CM

## 2017-07-24 DIAGNOSIS — D899 Disorder involving the immune mechanism, unspecified: ICD-10-CM

## 2017-07-24 DIAGNOSIS — J439 Emphysema, unspecified: ICD-10-CM

## 2017-07-24 DIAGNOSIS — C801 Malignant (primary) neoplasm, unspecified: Principal | ICD-10-CM

## 2017-07-24 DIAGNOSIS — C220 Liver cell carcinoma: ICD-10-CM

## 2017-07-24 DIAGNOSIS — C4402 Squamous cell carcinoma of skin of lip: ICD-10-CM

## 2017-07-24 DIAGNOSIS — J939 Pneumothorax, unspecified: Secondary | ICD-10-CM

## 2017-07-24 DIAGNOSIS — R911 Solitary pulmonary nodule: ICD-10-CM

## 2017-07-24 DIAGNOSIS — K7581 Nonalcoholic steatohepatitis (NASH): ICD-10-CM

## 2017-07-24 DIAGNOSIS — K219 Gastro-esophageal reflux disease without esophagitis: ICD-10-CM

## 2017-07-24 LAB — PROTIME INR (PT): Lab: 1.1 mg/dL — ABNORMAL LOW (ref 0.8–1.2)

## 2017-07-24 LAB — CBC: Lab: 6.3 K/UL — ABNORMAL LOW (ref 4.5–11.0)

## 2017-07-24 LAB — PLAVIX RESISTANCE (PLATELETWORKS): Lab: 32 % — ABNORMAL HIGH (ref 0–15)

## 2017-07-24 LAB — BASIC METABOLIC PANEL: Lab: 133 MMOL/L — ABNORMAL LOW (ref 137–147)

## 2017-07-24 MED ORDER — DEXTRAN 70-HYPROMELLOSE (PF) 0.1-0.3 % OP DPET
0 refills | Status: DC
Start: 2017-07-24 — End: 2017-07-25
  Administered 2017-07-25: 02:00:00 2 [drp] via OPHTHALMIC

## 2017-07-24 MED ORDER — SODIUM CHLORIDE 0.9 % IR SOLN
0 refills | Status: DC
Start: 2017-07-24 — End: 2017-07-25
  Administered 2017-07-25: 02:00:00 1000 mL

## 2017-07-24 MED ORDER — ALUM-MAG HYDROXIDE-SIMETH 200-200-20 MG/5 ML PO SUSP
30 mL | ORAL | 0 refills | Status: DC | PRN
Start: 2017-07-24 — End: 2017-07-26

## 2017-07-24 MED ORDER — NALOXONE 0.4 MG/ML IJ SOLN
.08 mg | INTRAVENOUS | 0 refills | Status: DC | PRN
Start: 2017-07-24 — End: 2017-07-26

## 2017-07-24 MED ORDER — SODIUM CHLORIDE 0.9 % IV SOLP
INTRAVENOUS | 0 refills | Status: DC
Start: 2017-07-24 — End: 2017-07-25
  Administered 2017-07-25: 03:00:00 1000.000 mL via INTRAVENOUS

## 2017-07-24 MED ORDER — POTASSIUM CHLORIDE IN WATER 10 MEQ/50 ML IV PGBK
10 meq | INTRAVENOUS | 0 refills | Status: DC | PRN
Start: 2017-07-24 — End: 2017-07-25

## 2017-07-24 MED ORDER — SUGAMMADEX 100 MG/ML IV SOLN
INTRAVENOUS | 0 refills | Status: DC
Start: 2017-07-24 — End: 2017-07-25
  Administered 2017-07-25: 03:00:00 140 mg via INTRAVENOUS

## 2017-07-24 MED ORDER — SODIUM CHLORIDE 0.9 % IV SOLP
1000 mL | INTRAVENOUS | 0 refills | Status: DC
Start: 2017-07-24 — End: 2017-07-25
  Administered 2017-07-25: 01:00:00 1000.000 mL via INTRAVENOUS

## 2017-07-24 MED ORDER — BISACODYL 10 MG RE SUPP
10 mg | Freq: Every day | RECTAL | 0 refills | Status: DC | PRN
Start: 2017-07-24 — End: 2017-07-26

## 2017-07-24 MED ORDER — FENTANYL CITRATE (PF) 50 MCG/ML IJ SOLN
50 ug | INTRAVENOUS | 0 refills | Status: DC | PRN
Start: 2017-07-24 — End: 2017-07-25

## 2017-07-24 MED ORDER — FENTANYL CITRATE (PF) 50 MCG/ML IJ SOLN
0 refills | Status: DC
Start: 2017-07-24 — End: 2017-07-25
  Administered 2017-07-25: 02:00:00 100 ug via INTRAVENOUS

## 2017-07-24 MED ORDER — ONDANSETRON HCL (PF) 4 MG/2 ML IJ SOLN
INTRAVENOUS | 0 refills | Status: DC
Start: 2017-07-24 — End: 2017-07-25
  Administered 2017-07-25: 03:00:00 4 mg via INTRAVENOUS

## 2017-07-24 MED ORDER — FAMOTIDINE (PF) 20 MG/2 ML IV SOLN
20 mg | Freq: Two times a day (BID) | INTRAVENOUS | 0 refills | Status: DC
Start: 2017-07-24 — End: 2017-07-25

## 2017-07-24 MED ORDER — OXYCODONE 5 MG PO TAB
5-10 mg | Freq: Once | ORAL | 0 refills | Status: DC | PRN
Start: 2017-07-24 — End: 2017-07-25

## 2017-07-24 MED ORDER — POTASSIUM CHLORIDE 20 MEQ/15 ML PO LIQD
20-40 meq | NASOGASTRIC | 0 refills | Status: DC | PRN
Start: 2017-07-24 — End: 2017-07-25

## 2017-07-24 MED ORDER — MINERAL OIL MISC OIL
0 refills | Status: DC
Start: 2017-07-24 — End: 2017-07-25
  Administered 2017-07-25: 02:00:00 10 mL via TOPICAL

## 2017-07-24 MED ORDER — ONDANSETRON HCL (PF) 4 MG/2 ML IJ SOLN
4 mg | Freq: Once | INTRAVENOUS | 0 refills | Status: AC | PRN
Start: 2017-07-24 — End: ?

## 2017-07-24 MED ORDER — FENTANYL CITRATE (PF) 50 MCG/ML IJ SOLN
25 ug | INTRAVENOUS | 0 refills | Status: DC | PRN
Start: 2017-07-24 — End: 2017-07-25
  Administered 2017-07-25 (×4): 25 ug via INTRAVENOUS

## 2017-07-24 MED ORDER — ACETAMINOPHEN 160 MG/5 ML PO SOLN
1000 mg | NASOGASTRIC | 0 refills | Status: DC
Start: 2017-07-24 — End: 2017-07-26
  Administered 2017-07-25: 22:00:00 1000 mg via NASOGASTRIC

## 2017-07-24 MED ORDER — LIDOCAINE (PF) 200 MG/10 ML (2 %) IJ SYRG
0 refills | Status: DC
Start: 2017-07-24 — End: 2017-07-25
  Administered 2017-07-25: 02:00:00 100 mg via INTRAVENOUS

## 2017-07-24 MED ORDER — CEFAZOLIN 1 GRAM IJ SOLR
0 refills | Status: DC
Start: 2017-07-24 — End: 2017-07-25
  Administered 2017-07-25: 02:00:00 2 g via INTRAVENOUS

## 2017-07-24 MED ORDER — HYDROMORPHONE PCA 10 MG/50 ML SYR (STD CONC)(ADULT)(PREMADE)
INTRAVENOUS | 0 refills | Status: DC
Start: 2017-07-24 — End: 2017-07-25
  Administered 2017-07-25: 03:00:00 50.000 mL via INTRAVENOUS

## 2017-07-24 MED ORDER — DEXTROSE 50 % IN WATER (D50W) IV SOLP
0 refills | Status: DC
Start: 2017-07-24 — End: 2017-07-25
  Administered 2017-07-25: 03:00:00 20 mL via TOPICAL

## 2017-07-24 MED ORDER — SENNOSIDES-DOCUSATE SODIUM 8.6-50 MG PO TAB
2 | Freq: Two times a day (BID) | ORAL | 0 refills | Status: DC
Start: 2017-07-24 — End: 2017-07-26
  Administered 2017-07-25: 06:00:00 2 via ORAL

## 2017-07-24 MED ORDER — ROCURONIUM 10 MG/ML IV SOLN
INTRAVENOUS | 0 refills | Status: DC
Start: 2017-07-24 — End: 2017-07-25
  Administered 2017-07-25: 02:00:00 40 mg via INTRAVENOUS

## 2017-07-24 MED ORDER — FAMOTIDINE 20 MG PO TAB
20 mg | Freq: Two times a day (BID) | ORAL | 0 refills | Status: DC
Start: 2017-07-24 — End: 2017-07-25

## 2017-07-24 MED ORDER — SENNA/DOCUSATE(#) 8.8/50MG/10ML PO SOLN
20 mL | Freq: Two times a day (BID) | NASOGASTRIC | 0 refills | Status: DC
Start: 2017-07-24 — End: 2017-07-26

## 2017-07-24 MED ORDER — PROMETHAZINE 25 MG/ML IJ SOLN
6.25 mg | INTRAVENOUS | 0 refills | Status: DC | PRN
Start: 2017-07-24 — End: 2017-07-25

## 2017-07-24 MED ORDER — DIPHENHYDRAMINE HCL 50 MG/ML IJ SOLN
12.5-25 mg | INTRAVENOUS | 0 refills | Status: DC | PRN
Start: 2017-07-24 — End: 2017-07-26

## 2017-07-24 MED ORDER — PROPOFOL INJ 10 MG/ML IV VIAL
0 refills | Status: DC
Start: 2017-07-24 — End: 2017-07-25
  Administered 2017-07-25: 02:00:00 120 mg via INTRAVENOUS

## 2017-07-24 MED ORDER — POTASSIUM CHLORIDE 20 MEQ PO TBTQ
20-40 meq | ORAL | 0 refills | Status: DC | PRN
Start: 2017-07-24 — End: 2017-07-25

## 2017-07-24 MED ORDER — TRANEXAMIC ACID 1,000 MG/10 ML (100 MG/ML) IV SOLN
0 refills | Status: DC
Start: 2017-07-24 — End: 2017-07-25
  Administered 2017-07-25: 03:00:00 10 mL via TOPICAL

## 2017-07-24 MED ORDER — ASPIRIN 81 MG PO CHEW
81 mg | Freq: Every day | ORAL | 0 refills | Status: DC
Start: 2017-07-24 — End: 2017-07-25

## 2017-07-24 MED ORDER — FENTANYL CITRATE (PF) 50 MCG/ML IJ SOLN
25 ug | INTRAVENOUS | 0 refills | Status: DC | PRN
Start: 2017-07-24 — End: 2017-07-25

## 2017-07-24 MED ORDER — ACETAMINOPHEN 500 MG PO TAB
1000 mg | ORAL | 0 refills | Status: DC
Start: 2017-07-24 — End: 2017-07-26
  Administered 2017-07-25 – 2017-07-26 (×4): 1000 mg via ORAL

## 2017-07-24 MED ORDER — HALOPERIDOL LACTATE 5 MG/ML IJ SOLN
1 mg | Freq: Once | INTRAVENOUS | 0 refills | Status: DC | PRN
Start: 2017-07-24 — End: 2017-07-25

## 2017-07-24 MED ORDER — ONDANSETRON HCL 4 MG PO TAB
4-8 mg | ORAL | 0 refills | Status: DC | PRN
Start: 2017-07-24 — End: 2017-07-26
  Administered 2017-07-26: 17:00:00 8 mg via ORAL

## 2017-07-24 MED ORDER — HYDRALAZINE 20 MG/ML IJ SOLN
10 mg | INTRAVENOUS | 0 refills | Status: DC | PRN
Start: 2017-07-24 — End: 2017-07-26

## 2017-07-24 MED ORDER — ONDANSETRON HCL (PF) 4 MG/2 ML IJ SOLN
4-8 mg | INTRAVENOUS | 0 refills | Status: DC | PRN
Start: 2017-07-24 — End: 2017-07-26
  Administered 2017-07-25 – 2017-07-26 (×3): 4 mg via INTRAVENOUS

## 2017-07-25 ENCOUNTER — Inpatient Hospital Stay: Admit: 2017-07-25 | Discharge: 2017-07-25 | Payer: MEDICARE

## 2017-07-25 DIAGNOSIS — J939 Pneumothorax, unspecified: Secondary | ICD-10-CM

## 2017-07-25 LAB — CBC: Lab: 11 K/UL — ABNORMAL HIGH (ref >60)

## 2017-07-25 LAB — PROTIME INR (PT): Lab: 1.1 MMOL/L — ABNORMAL LOW (ref >60)

## 2017-07-25 LAB — BASIC METABOLIC PANEL: Lab: 130 MMOL/L — ABNORMAL LOW (ref 137–147)

## 2017-07-25 MED ORDER — CLOPIDOGREL 75 MG PO TAB
75 mg | Freq: Every day | ORAL | 0 refills | Status: DC
Start: 2017-07-25 — End: 2017-07-26

## 2017-07-25 MED ORDER — OXYCODONE 5 MG PO TAB
5-15 mg | ORAL | 0 refills | Status: DC | PRN
Start: 2017-07-25 — End: 2017-07-26
  Administered 2017-07-25: 15:00:00 5 mg via ORAL

## 2017-07-25 MED ORDER — LACTULOSE 10 GRAM/15 ML PO SOLN
30 mL | Freq: Three times a day (TID) | ORAL | 0 refills | Status: DC | PRN
Start: 2017-07-25 — End: 2017-07-26
  Administered 2017-07-25 (×2): 20 g via ORAL

## 2017-07-25 MED ORDER — CLOPIDOGREL 75 MG PO TAB
75 mg | Freq: Every day | ORAL | 0 refills | Status: DC
Start: 2017-07-25 — End: 2017-07-25

## 2017-07-25 MED ORDER — ASPIRIN 325 MG PO TAB
325 mg | Freq: Every day | ORAL | 0 refills | Status: DC
Start: 2017-07-25 — End: 2017-07-26
  Administered 2017-07-26: 14:00:00 325 mg via ORAL

## 2017-07-25 MED ORDER — SODIUM CHLORIDE 7 % IN NEBU
Freq: Two times a day (BID) | RESPIRATORY_TRACT | 0 refills | Status: DC
Start: 2017-07-25 — End: 2017-07-26

## 2017-07-25 MED ORDER — TRAMADOL 50 MG PO TAB
50-100 mg | ORAL | 0 refills | Status: DC | PRN
Start: 2017-07-25 — End: 2017-07-26
  Administered 2017-07-26 (×2): 50 mg via ORAL

## 2017-07-25 MED ADMIN — SODIUM CHLORIDE 0.9 % IV SOLP [27838]: INTRAVENOUS | @ 02:00:00 | Stop: 2017-07-25 | NDC 00338004904

## 2017-07-26 ENCOUNTER — Inpatient Hospital Stay: Admit: 2017-07-21 | Discharge: 2017-07-21 | Payer: MEDICARE

## 2017-07-26 ENCOUNTER — Observation Stay: Admit: 2017-07-17 | Discharge: 2017-07-17 | Payer: MEDICARE

## 2017-07-26 ENCOUNTER — Inpatient Hospital Stay: Admit: 2017-07-24 | Discharge: 2017-07-24 | Payer: MEDICARE

## 2017-07-26 ENCOUNTER — Inpatient Hospital Stay: Admit: 2017-07-26 | Discharge: 2017-07-26 | Payer: MEDICARE

## 2017-07-26 ENCOUNTER — Inpatient Hospital Stay: Admit: 2017-07-19 | Discharge: 2017-07-19 | Payer: MEDICARE

## 2017-07-26 ENCOUNTER — Inpatient Hospital Stay: Admit: 2017-07-23 | Discharge: 2017-07-23 | Payer: MEDICARE

## 2017-07-26 ENCOUNTER — Ambulatory Visit: Admit: 2017-07-19 | Discharge: 2017-07-19 | Payer: MEDICARE

## 2017-07-26 ENCOUNTER — Inpatient Hospital Stay: Admit: 2017-07-22 | Discharge: 2017-07-22 | Payer: MEDICARE

## 2017-07-26 ENCOUNTER — Inpatient Hospital Stay: Admit: 2017-07-25 | Discharge: 2017-07-25 | Payer: MEDICARE

## 2017-07-26 ENCOUNTER — Encounter: Admit: 2017-07-26 | Discharge: 2017-07-26 | Payer: MEDICARE

## 2017-07-26 ENCOUNTER — Inpatient Hospital Stay
Admit: 2017-07-16 | Discharge: 2017-07-26 | Disposition: A | Discharge status: Home Health | Payer: MEDICARE | Source: Other Acute Inpatient Hospital

## 2017-07-26 DIAGNOSIS — K7290 Hepatic failure, unspecified without coma: ICD-10-CM

## 2017-07-26 DIAGNOSIS — Z89611 Acquired absence of right leg above knee: ICD-10-CM

## 2017-07-26 DIAGNOSIS — R911 Solitary pulmonary nodule: ICD-10-CM

## 2017-07-26 DIAGNOSIS — K5790 Diverticulosis of intestine, part unspecified, without perforation or abscess without bleeding: ICD-10-CM

## 2017-07-26 DIAGNOSIS — Z85819 Personal history of malignant neoplasm of unspecified site of lip, oral cavity, and pharynx: ICD-10-CM

## 2017-07-26 DIAGNOSIS — K7469 Other cirrhosis of liver: ICD-10-CM

## 2017-07-26 DIAGNOSIS — C3412 Malignant neoplasm of upper lobe, left bronchus or lung: ICD-10-CM

## 2017-07-26 DIAGNOSIS — K219 Gastro-esophageal reflux disease without esophagitis: ICD-10-CM

## 2017-07-26 DIAGNOSIS — Z9049 Acquired absence of other specified parts of digestive tract: ICD-10-CM

## 2017-07-26 DIAGNOSIS — K754 Autoimmune hepatitis: ICD-10-CM

## 2017-07-26 DIAGNOSIS — Z85828 Personal history of other malignant neoplasm of skin: ICD-10-CM

## 2017-07-26 DIAGNOSIS — C220 Liver cell carcinoma: ICD-10-CM

## 2017-07-26 DIAGNOSIS — E871 Hypo-osmolality and hyponatremia: ICD-10-CM

## 2017-07-26 DIAGNOSIS — Z7902 Long term (current) use of antithrombotics/antiplatelets: ICD-10-CM

## 2017-07-26 DIAGNOSIS — K7581 Nonalcoholic steatohepatitis (NASH): ICD-10-CM

## 2017-07-26 DIAGNOSIS — Z85831 Personal history of malignant neoplasm of soft tissue: ICD-10-CM

## 2017-07-26 DIAGNOSIS — R748 Abnormal levels of other serum enzymes: ICD-10-CM

## 2017-07-26 DIAGNOSIS — J9811 Atelectasis: ICD-10-CM

## 2017-07-26 DIAGNOSIS — J95811 Postprocedural pneumothorax: Principal | ICD-10-CM

## 2017-07-26 DIAGNOSIS — F17210 Nicotine dependence, cigarettes, uncomplicated: ICD-10-CM

## 2017-07-26 DIAGNOSIS — J939 Pneumothorax, unspecified: Secondary | ICD-10-CM

## 2017-07-26 LAB — CBC: Lab: 7.6 K/UL — ABNORMAL LOW (ref 4.5–11.0)

## 2017-07-26 LAB — PROTIME INR (PT): Lab: 1.2 M/UL — ABNORMAL LOW (ref >60)

## 2017-07-26 LAB — BASIC METABOLIC PANEL: Lab: 128 MMOL/L — ABNORMAL LOW (ref 137–147)

## 2017-07-26 MED ORDER — ACETAMINOPHEN 500 MG PO TAB
1000 mg | ORAL | 0 refills | Status: AC | PRN
Start: 2017-07-26 — End: 2017-12-19

## 2017-07-26 MED ORDER — SENNOSIDES-DOCUSATE SODIUM 8.6-50 MG PO TAB
2 | Freq: Two times a day (BID) | ORAL | 0 refills | Status: AC
Start: 2017-07-26 — End: 2017-08-08

## 2017-07-26 MED ORDER — OXYCODONE 5 MG PO TAB
5-10 mg | ORAL_TABLET | ORAL | 0 refills | 6.00000 days | Status: AC | PRN
Start: 2017-07-26 — End: 2017-08-08
  Filled 2017-07-26 (×2): qty 30, 5d supply, fill #1

## 2017-07-26 MED ORDER — ONDANSETRON HCL 4 MG PO TAB
4 mg | ORAL_TABLET | ORAL | 3 refills | 8.00000 days | Status: AC | PRN
Start: 2017-07-26 — End: 2017-08-08

## 2017-07-26 MED ORDER — TRAMADOL 50 MG PO TAB
50-100 mg | ORAL_TABLET | ORAL | 0 refills | Status: AC | PRN
Start: 2017-07-26 — End: 2017-08-08
  Filled 2017-07-26 (×2): qty 30, 4d supply, fill #1

## 2017-07-27 ENCOUNTER — Encounter: Admit: 2017-07-27 | Discharge: 2017-07-27 | Payer: MEDICARE

## 2017-07-27 DIAGNOSIS — C220 Liver cell carcinoma: ICD-10-CM

## 2017-07-27 DIAGNOSIS — K219 Gastro-esophageal reflux disease without esophagitis: ICD-10-CM

## 2017-07-27 DIAGNOSIS — C801 Malignant (primary) neoplasm, unspecified: Principal | ICD-10-CM

## 2017-07-27 DIAGNOSIS — K7581 Nonalcoholic steatohepatitis (NASH): ICD-10-CM

## 2017-07-27 DIAGNOSIS — C4402 Squamous cell carcinoma of skin of lip: ICD-10-CM

## 2017-07-27 DIAGNOSIS — K759 Inflammatory liver disease, unspecified: ICD-10-CM

## 2017-07-27 DIAGNOSIS — D899 Disorder involving the immune mechanism, unspecified: ICD-10-CM

## 2017-07-27 DIAGNOSIS — J439 Emphysema, unspecified: ICD-10-CM

## 2017-07-27 DIAGNOSIS — K5790 Diverticulosis of intestine, part unspecified, without perforation or abscess without bleeding: ICD-10-CM

## 2017-07-27 DIAGNOSIS — R911 Solitary pulmonary nodule: ICD-10-CM

## 2017-07-27 DIAGNOSIS — C499 Malignant neoplasm of connective and soft tissue, unspecified: ICD-10-CM

## 2017-07-31 ENCOUNTER — Encounter: Admit: 2017-07-31 | Discharge: 2017-07-31 | Payer: MEDICARE

## 2017-07-31 DIAGNOSIS — K219 Gastro-esophageal reflux disease without esophagitis: ICD-10-CM

## 2017-07-31 DIAGNOSIS — J439 Emphysema, unspecified: ICD-10-CM

## 2017-07-31 DIAGNOSIS — C4402 Squamous cell carcinoma of skin of lip: ICD-10-CM

## 2017-07-31 DIAGNOSIS — K759 Inflammatory liver disease, unspecified: ICD-10-CM

## 2017-07-31 DIAGNOSIS — C220 Liver cell carcinoma: ICD-10-CM

## 2017-07-31 DIAGNOSIS — D899 Disorder involving the immune mechanism, unspecified: ICD-10-CM

## 2017-07-31 DIAGNOSIS — K7581 Nonalcoholic steatohepatitis (NASH): ICD-10-CM

## 2017-07-31 DIAGNOSIS — C801 Malignant (primary) neoplasm, unspecified: Principal | ICD-10-CM

## 2017-07-31 DIAGNOSIS — R911 Solitary pulmonary nodule: ICD-10-CM

## 2017-07-31 DIAGNOSIS — C499 Malignant neoplasm of connective and soft tissue, unspecified: ICD-10-CM

## 2017-07-31 DIAGNOSIS — K5790 Diverticulosis of intestine, part unspecified, without perforation or abscess without bleeding: ICD-10-CM

## 2017-08-02 ENCOUNTER — Encounter: Admit: 2017-08-02 | Discharge: 2017-08-02 | Payer: MEDICARE

## 2017-08-03 ENCOUNTER — Encounter: Admit: 2017-08-03 | Discharge: 2017-08-03 | Payer: MEDICARE

## 2017-08-03 ENCOUNTER — Encounter: Admit: 2017-08-03 | Discharge: 2017-08-04 | Payer: MEDICARE

## 2017-08-03 DIAGNOSIS — C3490 Malignant neoplasm of unspecified part of unspecified bronchus or lung: ICD-10-CM

## 2017-08-03 DIAGNOSIS — K754 Autoimmune hepatitis: ICD-10-CM

## 2017-08-03 DIAGNOSIS — D899 Disorder involving the immune mechanism, unspecified: Secondary | ICD-10-CM

## 2017-08-03 DIAGNOSIS — C220 Liver cell carcinoma: Principal | ICD-10-CM

## 2017-08-03 DIAGNOSIS — C499 Malignant neoplasm of connective and soft tissue, unspecified: ICD-10-CM

## 2017-08-03 DIAGNOSIS — C4402 Squamous cell carcinoma of skin of lip: ICD-10-CM

## 2017-08-03 DIAGNOSIS — R911 Solitary pulmonary nodule: ICD-10-CM

## 2017-08-03 DIAGNOSIS — K759 Inflammatory liver disease, unspecified: ICD-10-CM

## 2017-08-03 DIAGNOSIS — C801 Malignant (primary) neoplasm, unspecified: Principal | ICD-10-CM

## 2017-08-03 DIAGNOSIS — R918 Other nonspecific abnormal finding of lung field: ICD-10-CM

## 2017-08-03 DIAGNOSIS — K7581 Nonalcoholic steatohepatitis (NASH): ICD-10-CM

## 2017-08-03 DIAGNOSIS — J439 Emphysema, unspecified: ICD-10-CM

## 2017-08-03 DIAGNOSIS — K219 Gastro-esophageal reflux disease without esophagitis: ICD-10-CM

## 2017-08-03 DIAGNOSIS — K5790 Diverticulosis of intestine, part unspecified, without perforation or abscess without bleeding: ICD-10-CM

## 2017-08-03 LAB — COMPREHENSIVE METABOLIC PANEL
Lab: 0.6 mg/dL (ref 0.3–1.2)
Lab: 10 mg/dL (ref 7–25)
Lab: 135 MMOL/L — ABNORMAL LOW (ref 137–147)
Lab: 136 U/L — ABNORMAL HIGH (ref 25–110)
Lab: 17 U/L (ref 7–56)
Lab: 25 MMOL/L (ref 21–30)
Lab: 3 K/UL (ref 3–12)
Lab: 3.1 g/dL — ABNORMAL LOW (ref 3.5–5.0)
Lab: 31 U/L — ABNORMAL HIGH (ref 7–40)
Lab: 4.1 MMOL/L — ABNORMAL LOW (ref 3.5–5.1)
Lab: 6.1 g/dL (ref 6.0–8.0)
Lab: 9 mg/dL (ref 8.5–10.6)
Lab: 99 mg/dL — ABNORMAL HIGH (ref 70–100)
Lab: >60 mL/min (ref >60)
Lab: >60 mL/min (ref >60)

## 2017-08-03 LAB — PROTIME INR (PT): Lab: 1.1 MMOL/L — ABNORMAL HIGH (ref 0.8–1.2)

## 2017-08-03 LAB — ALPHA FETO PROTEIN (AFP): Lab: 3.4 ng/mL (ref 0.0–15.0)

## 2017-08-03 LAB — POC CREATININE, RAD: Lab: 0.6 mg/dL — ABNORMAL HIGH (ref 0.4–1.00)

## 2017-08-03 LAB — CBC AND DIFF
Lab: 0.1 10*3/uL (ref 0–0.20)
Lab: 3.5 M/UL — ABNORMAL LOW (ref 4.0–5.0)
Lab: 5.7 10*3/uL (ref 4.5–11.0)

## 2017-08-03 MED ORDER — GADOXETATE 0.25 MMOL/ML (181.43 MG/ML) IV SOLN
10 mL | Freq: Once | INTRAVENOUS | 0 refills | Status: CP
Start: 2017-08-03 — End: ?
  Administered 2017-08-03: 14:00:00 10 mL via INTRAVENOUS

## 2017-08-03 MED ORDER — SODIUM CHLORIDE 0.9 % IJ SOLN
50 mL | Freq: Once | INTRAVENOUS | 0 refills | Status: CP
Start: 2017-08-03 — End: ?
  Administered 2017-08-03: 14:00:00 50 mL via INTRAVENOUS

## 2017-08-06 ENCOUNTER — Encounter: Admit: 2017-08-06 | Discharge: 2017-08-06 | Payer: MEDICARE

## 2017-08-06 DIAGNOSIS — C220 Liver cell carcinoma: Principal | ICD-10-CM

## 2017-08-07 ENCOUNTER — Encounter: Admit: 2017-08-07 | Discharge: 2017-08-07 | Payer: MEDICARE

## 2017-08-08 ENCOUNTER — Ambulatory Visit: Admit: 2017-08-08 | Discharge: 2017-08-08 | Payer: MEDICARE

## 2017-08-08 ENCOUNTER — Encounter: Admit: 2017-08-08 | Discharge: 2017-08-08 | Payer: MEDICARE

## 2017-08-08 DIAGNOSIS — Z09 Encounter for follow-up examination after completed treatment for conditions other than malignant neoplasm: ICD-10-CM

## 2017-08-08 DIAGNOSIS — C4402 Squamous cell carcinoma of skin of lip: ICD-10-CM

## 2017-08-08 DIAGNOSIS — J439 Emphysema, unspecified: ICD-10-CM

## 2017-08-08 DIAGNOSIS — R918 Other nonspecific abnormal finding of lung field: Principal | ICD-10-CM

## 2017-08-08 DIAGNOSIS — R911 Solitary pulmonary nodule: ICD-10-CM

## 2017-08-08 DIAGNOSIS — C3412 Malignant neoplasm of upper lobe, left bronchus or lung: ICD-10-CM

## 2017-08-08 DIAGNOSIS — C220 Liver cell carcinoma: ICD-10-CM

## 2017-08-08 DIAGNOSIS — K7581 Nonalcoholic steatohepatitis (NASH): ICD-10-CM

## 2017-08-08 DIAGNOSIS — K5790 Diverticulosis of intestine, part unspecified, without perforation or abscess without bleeding: ICD-10-CM

## 2017-08-08 DIAGNOSIS — K219 Gastro-esophageal reflux disease without esophagitis: ICD-10-CM

## 2017-08-08 DIAGNOSIS — K759 Inflammatory liver disease, unspecified: ICD-10-CM

## 2017-08-08 DIAGNOSIS — C801 Malignant (primary) neoplasm, unspecified: Principal | ICD-10-CM

## 2017-08-08 DIAGNOSIS — D899 Disorder involving the immune mechanism, unspecified: ICD-10-CM

## 2017-08-08 DIAGNOSIS — C499 Malignant neoplasm of connective and soft tissue, unspecified: ICD-10-CM

## 2017-08-13 ENCOUNTER — Encounter: Admit: 2017-08-13 | Discharge: 2017-08-13 | Payer: MEDICARE

## 2017-08-13 DIAGNOSIS — C801 Malignant (primary) neoplasm, unspecified: ICD-10-CM

## 2017-08-13 DIAGNOSIS — C4402 Squamous cell carcinoma of skin of lip: ICD-10-CM

## 2017-08-13 DIAGNOSIS — D899 Disorder involving the immune mechanism, unspecified: Secondary | ICD-10-CM

## 2017-08-13 DIAGNOSIS — J439 Emphysema, unspecified: ICD-10-CM

## 2017-08-13 DIAGNOSIS — K759 Inflammatory liver disease, unspecified: Principal | ICD-10-CM

## 2017-08-13 DIAGNOSIS — K7581 Nonalcoholic steatohepatitis (NASH): ICD-10-CM

## 2017-08-13 DIAGNOSIS — K219 Gastro-esophageal reflux disease without esophagitis: ICD-10-CM

## 2017-08-13 DIAGNOSIS — R911 Solitary pulmonary nodule: ICD-10-CM

## 2017-08-13 DIAGNOSIS — C220 Liver cell carcinoma: ICD-10-CM

## 2017-08-13 DIAGNOSIS — K5790 Diverticulosis of intestine, part unspecified, without perforation or abscess without bleeding: ICD-10-CM

## 2017-08-13 DIAGNOSIS — C499 Malignant neoplasm of connective and soft tissue, unspecified: ICD-10-CM

## 2017-08-13 MED ORDER — IMIQUIMOD 5 % TP CRPK
PACK | 3 refills | Status: AC
Start: 2017-08-13 — End: 2017-12-19

## 2017-08-14 ENCOUNTER — Ambulatory Visit: Admit: 2017-08-13 | Discharge: 2017-08-14 | Payer: MEDICARE

## 2017-08-14 DIAGNOSIS — Z87828 Personal history of other (healed) physical injury and trauma: ICD-10-CM

## 2017-08-14 DIAGNOSIS — K754 Autoimmune hepatitis: ICD-10-CM

## 2017-08-14 DIAGNOSIS — Z85828 Personal history of other malignant neoplasm of skin: ICD-10-CM

## 2017-08-14 DIAGNOSIS — D229 Melanocytic nevi, unspecified: ICD-10-CM

## 2017-08-14 DIAGNOSIS — C228 Malignant neoplasm of liver, primary, unspecified as to type: ICD-10-CM

## 2017-08-14 DIAGNOSIS — L570 Actinic keratosis: Secondary | ICD-10-CM

## 2017-08-14 DIAGNOSIS — L821 Other seborrheic keratosis: ICD-10-CM

## 2017-08-20 ENCOUNTER — Encounter: Admit: 2017-08-20 | Discharge: 2017-08-20 | Payer: MEDICARE

## 2017-08-21 ENCOUNTER — Ambulatory Visit: Admit: 2017-08-21 | Discharge: 2017-08-21 | Payer: MEDICARE

## 2017-08-22 ENCOUNTER — Encounter: Admit: 2017-08-22 | Discharge: 2017-08-22 | Payer: MEDICARE

## 2017-08-22 ENCOUNTER — Ambulatory Visit: Admit: 2017-08-22 | Discharge: 2017-08-22 | Payer: MEDICARE

## 2017-08-22 DIAGNOSIS — C220 Liver cell carcinoma: Principal | ICD-10-CM

## 2017-08-22 MED ORDER — OXYCODONE 5 MG PO TAB
5-10 mg | Freq: Once | ORAL | 0 refills | Status: DC | PRN
Start: 2017-08-22 — End: 2017-08-22

## 2017-08-22 MED ORDER — DEXTRAN 70-HYPROMELLOSE (PF) 0.1-0.3 % OP DPET
0 refills | Status: DC
Start: 2017-08-22 — End: 2017-08-22
  Administered 2017-08-22: 13:00:00 2 [drp] via OPHTHALMIC

## 2017-08-22 MED ORDER — ONDANSETRON HCL (PF) 4 MG/2 ML IJ SOLN
INTRAVENOUS | 0 refills | Status: DC
Start: 2017-08-22 — End: 2017-08-22
  Administered 2017-08-22: 14:00:00 4 mg via INTRAVENOUS

## 2017-08-22 MED ORDER — FENTANYL CITRATE (PF) 50 MCG/ML IJ SOLN
50 ug | INTRAVENOUS | 0 refills | Status: DC | PRN
Start: 2017-08-22 — End: 2017-08-22
  Administered 2017-08-22 (×2): 50 ug via INTRAVENOUS

## 2017-08-22 MED ORDER — FENTANYL CITRATE (PF) 50 MCG/ML IJ SOLN
0 refills | Status: DC
Start: 2017-08-22 — End: 2017-08-22
  Administered 2017-08-22 (×2): 50 ug via INTRAVENOUS

## 2017-08-22 MED ORDER — FENTANYL CITRATE (PF) 50 MCG/ML IJ SOLN
25 ug | INTRAVENOUS | 0 refills | Status: DC | PRN
Start: 2017-08-22 — End: 2017-08-22

## 2017-08-22 MED ORDER — SUCCINYLCHOLINE CHLORIDE 20 MG/ML IJ SOLN
INTRAVENOUS | 0 refills | Status: DC
Start: 2017-08-22 — End: 2017-08-22
  Administered 2017-08-22: 13:00:00 100 mg via INTRAVENOUS

## 2017-08-22 MED ORDER — LIDOCAINE (PF) 200 MG/10 ML (2 %) IJ SYRG
0 refills | Status: DC
Start: 2017-08-22 — End: 2017-08-22
  Administered 2017-08-22: 13:00:00 80 mg via INTRAVENOUS

## 2017-08-22 MED ORDER — DEXAMETHASONE SODIUM PHOSPHATE 4 MG/ML IJ SOLN
INTRAVENOUS | 0 refills | Status: DC
Start: 2017-08-22 — End: 2017-08-22
  Administered 2017-08-22: 14:00:00 4 mg via INTRAVENOUS

## 2017-08-22 MED ORDER — LACTATED RINGERS IV SOLP
0 refills | Status: DC
  Administered 2017-08-22: 13:00:00 via INTRAVENOUS

## 2017-08-22 MED ORDER — PROMETHAZINE 25 MG/ML IJ SOLN
6.25 mg | INTRAVENOUS | 0 refills | Status: DC | PRN
Start: 2017-08-22 — End: 2017-08-22

## 2017-08-22 MED ORDER — PROPOFOL INJ 10 MG/ML IV VIAL
0 refills | Status: DC
Start: 2017-08-22 — End: 2017-08-22
  Administered 2017-08-22: 13:00:00 170 mg via INTRAVENOUS

## 2017-08-22 MED ORDER — AMPICILLIN/SULBACTAM 3G/100ML NS IVPB (MB+)
3 g | Freq: Once | INTRAVENOUS | 0 refills | Status: DC
Start: 2017-08-22 — End: 2017-08-22

## 2017-08-22 MED ORDER — CEFAZOLIN 1 GRAM IJ SOLR
0 refills | Status: DC
Start: 2017-08-22 — End: 2017-08-22
  Administered 2017-08-22: 13:00:00 2 g via INTRAVENOUS

## 2017-08-22 MED ORDER — FENTANYL CITRATE (PF) 50 MCG/ML IJ SOLN
50 ug | INTRAVENOUS | 0 refills | Status: DC | PRN
Start: 2017-08-22 — End: 2017-08-22

## 2017-08-27 ENCOUNTER — Encounter: Admit: 2017-08-27 | Discharge: 2017-08-27 | Payer: MEDICARE

## 2017-08-27 DIAGNOSIS — K7469 Other cirrhosis of liver: Principal | ICD-10-CM

## 2017-08-27 LAB — CBC
Lab: 214 10*3/uL (ref 130–400)
Lab: 3.9 mmol/L — ABNORMAL LOW (ref 4.20–5.40)
Lab: 4.2 10*3/uL — ABNORMAL LOW (ref 4.8–10.8)

## 2017-08-27 LAB — COMPREHENSIVE METABOLIC PANEL
Lab: 0.6 mg/dL (ref 0.0–1.0)
Lab: 136 mmol/L (ref 136–145)
Lab: 177 U/L — ABNORMAL HIGH (ref 40–150)
Lab: 3.1 g/dL — ABNORMAL LOW (ref 3.4–4.8)
Lab: 32 U/L (ref 0–55)
Lab: 57 U/L — ABNORMAL HIGH (ref 5–34)
Lab: 6.9 g/dL (ref 6.2–8.1)

## 2017-08-27 LAB — PROTIME INR (PT): Lab: 12 s (ref 9.9–12.1)

## 2017-09-10 ENCOUNTER — Encounter: Admit: 2017-09-10 | Discharge: 2017-09-10 | Payer: MEDICARE

## 2017-09-10 MED ORDER — SULFAMETHOXAZOLE-TRIMETHOPRIM 800-160 MG PO TAB
1 | ORAL_TABLET | ORAL | 3 refills | Status: AC
Start: 2017-09-10 — End: 2017-12-28

## 2017-09-18 ENCOUNTER — Encounter: Admit: 2017-09-18 | Discharge: 2017-09-18 | Payer: MEDICARE

## 2017-09-18 ENCOUNTER — Encounter: Admit: 2017-09-18 | Discharge: 2017-09-19 | Payer: MEDICARE

## 2017-09-18 DIAGNOSIS — C220 Liver cell carcinoma: Principal | ICD-10-CM

## 2017-09-18 DIAGNOSIS — K5790 Diverticulosis of intestine, part unspecified, without perforation or abscess without bleeding: ICD-10-CM

## 2017-09-18 DIAGNOSIS — K754 Autoimmune hepatitis: ICD-10-CM

## 2017-09-18 DIAGNOSIS — C3490 Malignant neoplasm of unspecified part of unspecified bronchus or lung: ICD-10-CM

## 2017-09-18 DIAGNOSIS — K219 Gastro-esophageal reflux disease without esophagitis: ICD-10-CM

## 2017-09-18 DIAGNOSIS — S88911A Complete traumatic amputation of right lower leg, level unspecified, initial encounter: ICD-10-CM

## 2017-09-18 DIAGNOSIS — R911 Solitary pulmonary nodule: ICD-10-CM

## 2017-09-18 DIAGNOSIS — K7581 Nonalcoholic steatohepatitis (NASH): ICD-10-CM

## 2017-09-18 DIAGNOSIS — J439 Emphysema, unspecified: ICD-10-CM

## 2017-09-18 DIAGNOSIS — C4402 Squamous cell carcinoma of skin of lip: ICD-10-CM

## 2017-09-18 DIAGNOSIS — D899 Disorder involving the immune mechanism, unspecified: ICD-10-CM

## 2017-09-18 DIAGNOSIS — C801 Malignant (primary) neoplasm, unspecified: ICD-10-CM

## 2017-09-18 DIAGNOSIS — K759 Inflammatory liver disease, unspecified: Principal | ICD-10-CM

## 2017-09-18 DIAGNOSIS — C499 Malignant neoplasm of connective and soft tissue, unspecified: ICD-10-CM

## 2017-09-18 LAB — COMPREHENSIVE METABOLIC PANEL
Lab: 0.6 mg/dL (ref 0.3–1.2)
Lab: 138 MMOL/L (ref 137–147)
Lab: 141 U/L — ABNORMAL HIGH (ref 25–110)
Lab: 26 MMOL/L (ref 21–30)
Lab: 28 U/L (ref 7–56)
Lab: 3.4 g/dL — ABNORMAL LOW (ref 3.5–5.0)
Lab: 4.2 MMOL/L (ref 3.5–5.1)
Lab: 56 U/L — ABNORMAL HIGH (ref 7–40)
Lab: 6 K/UL (ref 3–12)
Lab: 88 mg/dL — ABNORMAL HIGH (ref 70–100)
Lab: >60 mL/min (ref >60)
Lab: >60 mL/min (ref >60)

## 2017-09-18 LAB — ALPHA FETO PROTEIN (AFP): Lab: 2.8 ng/mL (ref 0.0–15.0)

## 2017-09-18 LAB — CBC AND DIFF
Lab: 0 10*3/uL (ref 0–0.20)
Lab: 3.7 M/UL — ABNORMAL LOW (ref 4.0–5.0)
Lab: 4.1 10*3/uL — ABNORMAL LOW (ref 4.5–11.0)

## 2017-09-18 LAB — POC CREATININE, RAD: Lab: 0.8 mg/dL (ref 0.4–1.00)

## 2017-09-18 LAB — PROTIME INR (PT): Lab: 1.1 MMOL/L — ABNORMAL HIGH (ref 0.8–1.2)

## 2017-09-18 MED ORDER — GADOXETATE 0.25 MMOL/ML (181.43 MG/ML) IV SOLN
10 mL | Freq: Once | INTRAVENOUS | 0 refills | Status: CP
Start: 2017-09-18 — End: ?
  Administered 2017-09-18: 12:00:00 10 mL via INTRAVENOUS

## 2017-09-18 MED ORDER — SODIUM CHLORIDE 0.9 % IJ SOLN
50 mL | Freq: Once | INTRAVENOUS | 0 refills | Status: CP
Start: 2017-09-18 — End: ?
  Administered 2017-09-18: 12:00:00 50 mL via INTRAVENOUS

## 2017-10-02 ENCOUNTER — Encounter: Admit: 2017-10-02 | Discharge: 2017-10-02 | Payer: MEDICARE

## 2017-10-12 ENCOUNTER — Encounter: Admit: 2017-10-12 | Discharge: 2017-10-12 | Payer: MEDICARE

## 2017-11-06 ENCOUNTER — Encounter: Admit: 2017-11-06 | Discharge: 2017-11-06 | Payer: MEDICARE

## 2017-11-12 ENCOUNTER — Encounter: Admit: 2017-11-12 | Discharge: 2017-11-12 | Payer: MEDICARE

## 2017-11-14 ENCOUNTER — Encounter: Admit: 2017-11-14 | Discharge: 2017-11-14 | Payer: MEDICARE

## 2017-11-14 MED ORDER — FUROSEMIDE 20 MG PO TAB
20 mg | ORAL_TABLET | Freq: Two times a day (BID) | ORAL | 5 refills | 90.00000 days | Status: AC
Start: 2017-11-14 — End: 2018-05-06

## 2017-11-14 MED ORDER — AZATHIOPRINE 50 MG PO TAB
50 mg | ORAL_TABLET | Freq: Every evening | ORAL | 3 refills | Status: AC
Start: 2017-11-14 — End: 2018-11-08

## 2017-12-19 ENCOUNTER — Encounter: Admit: 2017-12-19 | Discharge: 2017-12-19 | Payer: MEDICARE

## 2017-12-19 ENCOUNTER — Encounter: Admit: 2017-12-19 | Discharge: 2017-12-20 | Payer: MEDICARE

## 2017-12-19 DIAGNOSIS — C4402 Squamous cell carcinoma of skin of lip: ICD-10-CM

## 2017-12-19 DIAGNOSIS — J439 Emphysema, unspecified: ICD-10-CM

## 2017-12-19 DIAGNOSIS — K754 Autoimmune hepatitis: ICD-10-CM

## 2017-12-19 DIAGNOSIS — C3492 Malignant neoplasm of unspecified part of left bronchus or lung: ICD-10-CM

## 2017-12-19 DIAGNOSIS — K7581 Nonalcoholic steatohepatitis (NASH): ICD-10-CM

## 2017-12-19 DIAGNOSIS — I2510 Atherosclerotic heart disease of native coronary artery without angina pectoris: ICD-10-CM

## 2017-12-19 DIAGNOSIS — K5730 Diverticulosis of large intestine without perforation or abscess without bleeding: ICD-10-CM

## 2017-12-19 DIAGNOSIS — M47814 Spondylosis without myelopathy or radiculopathy, thoracic region: ICD-10-CM

## 2017-12-19 DIAGNOSIS — C801 Malignant (primary) neoplasm, unspecified: ICD-10-CM

## 2017-12-19 DIAGNOSIS — Z902 Acquired absence of lung [part of]: ICD-10-CM

## 2017-12-19 DIAGNOSIS — Z9049 Acquired absence of other specified parts of digestive tract: ICD-10-CM

## 2017-12-19 DIAGNOSIS — C220 Liver cell carcinoma: Principal | ICD-10-CM

## 2017-12-19 DIAGNOSIS — K449 Diaphragmatic hernia without obstruction or gangrene: ICD-10-CM

## 2017-12-19 DIAGNOSIS — K7460 Unspecified cirrhosis of liver: ICD-10-CM

## 2017-12-19 DIAGNOSIS — K5790 Diverticulosis of intestine, part unspecified, without perforation or abscess without bleeding: ICD-10-CM

## 2017-12-19 DIAGNOSIS — Z89611 Acquired absence of right leg above knee: ICD-10-CM

## 2017-12-19 DIAGNOSIS — K219 Gastro-esophageal reflux disease without esophagitis: ICD-10-CM

## 2017-12-19 DIAGNOSIS — K759 Inflammatory liver disease, unspecified: Principal | ICD-10-CM

## 2017-12-19 DIAGNOSIS — C499 Malignant neoplasm of connective and soft tissue, unspecified: ICD-10-CM

## 2017-12-19 DIAGNOSIS — R911 Solitary pulmonary nodule: ICD-10-CM

## 2017-12-19 DIAGNOSIS — D899 Disorder involving the immune mechanism, unspecified: ICD-10-CM

## 2017-12-19 LAB — COMPREHENSIVE METABOLIC PANEL
Lab: 0.7 mg/dL (ref 0.3–1.2)
Lab: 0.9 mg/dL (ref 0.4–1.00)
Lab: 101 U/L (ref 25–110)
Lab: 135 MMOL/L — ABNORMAL LOW (ref 137–147)
Lab: 15 U/L (ref 7–56)
Lab: 16 mg/dL (ref 7–25)
Lab: 25 MMOL/L (ref 21–30)
Lab: 29 U/L (ref 7–40)
Lab: 3.8 g/dL (ref 3.5–5.0)
Lab: 4.2 MMOL/L (ref 3.5–5.1)
Lab: 6 K/UL — ABNORMAL LOW (ref 3–12)
Lab: 60 mL/min — ABNORMAL LOW (ref >60)
Lab: 7.2 g/dL (ref 6.0–8.0)
Lab: 88 mg/dL (ref 70–100)
Lab: 9.3 mg/dL — ABNORMAL LOW (ref 8.5–10.6)
Lab: >60 mL/min (ref >60)

## 2017-12-19 LAB — CBC AND DIFF
Lab: 0.1 10*3/uL (ref 0–0.20)
Lab: 3.6 10*3/uL — ABNORMAL LOW (ref 4.5–11.0)
Lab: 3.7 M/UL — ABNORMAL LOW (ref 4.0–5.0)

## 2017-12-19 LAB — POC CREATININE, RAD: Lab: 0.9 mg/dL (ref 0.4–1.00)

## 2017-12-19 LAB — ALPHA FETO PROTEIN (AFP): Lab: 2.8 ng/mL (ref 0.0–15.0)

## 2017-12-19 LAB — PROTIME INR (PT): Lab: 1.1 MMOL/L (ref 0.8–1.2)

## 2017-12-19 MED ORDER — GADOXETATE 0.25 MMOL/ML (181.43 MG/ML) IV SOLN
10 mL | Freq: Once | INTRAVENOUS | 0 refills | Status: CP
Start: 2017-12-19 — End: ?
  Administered 2017-12-19: 14:00:00 10 mL via INTRAVENOUS

## 2017-12-19 MED ORDER — SODIUM CHLORIDE 0.9 % IJ SOLN
50 mL | Freq: Once | INTRAVENOUS | 0 refills | Status: CP
Start: 2017-12-19 — End: ?
  Administered 2017-12-19: 14:00:00 50 mL via INTRAVENOUS

## 2017-12-28 ENCOUNTER — Encounter: Admit: 2017-12-28 | Discharge: 2017-12-28 | Payer: MEDICARE

## 2017-12-28 MED ORDER — SULFAMETHOXAZOLE-TRIMETHOPRIM 800-160 MG PO TAB
1 | ORAL_TABLET | ORAL | 3 refills | Status: AC
Start: 2017-12-28 — End: 2018-04-15

## 2018-01-22 ENCOUNTER — Encounter: Admit: 2018-01-22 | Discharge: 2018-01-22 | Payer: MEDICARE

## 2018-02-04 ENCOUNTER — Encounter: Admit: 2018-02-04 | Discharge: 2018-02-04 | Payer: MEDICARE

## 2018-02-10 ENCOUNTER — Encounter: Admit: 2018-02-10 | Discharge: 2018-02-10 | Payer: MEDICARE

## 2018-02-12 ENCOUNTER — Encounter: Admit: 2018-02-12 | Discharge: 2018-02-12 | Payer: MEDICARE

## 2018-02-12 MED ORDER — SPIRONOLACTONE 50 MG PO TAB
50 mg | ORAL_TABLET | Freq: Every day | ORAL | 5 refills | 90.00000 days | Status: AC
Start: 2018-02-12 — End: 2018-08-08

## 2018-03-11 NOTE — Progress Notes
Name: Sarah Pineda          MRN: [id number]      DOB: May 30, 1950      AGE: 68 y.o.   DATE OF SERVICE: 03/13/2018    Subjective:             Reason for Visit:  Follow Up      Sarah Pineda is a 68 y.o. female.     Cancer Staging  Hepatocellular carcinoma (HCC)  Staging form: Liver, AJCC 8th Edition  - Clinical: cT1, cN0, cM0 - Signed by Dayton Bailiff, MD on 06/06/2017    Non-small cell lung cancer (HCC)  Staging form: Lung, AJCC 8th Edition  - Pathologic: Stage IA2 (pT1b, pN0, cM0) - Signed by Dayton Bailiff, MD on 08/03/2017  - Pathologic: No stage assigned - Unsigned      History of Present Illness    Doing well denies any complaints.  Denies any ascites denies any encephalopathy.  No lumps or bumps under the skin no bone pains or aches.  Denies any nausea or vomiting.  Here for restaging scans.. She continues to have significant fatigue but it is not affecting her ability to care for herself but limits her activity during the day.  She has difficult time exercising due to her amputation.       Review of Systems   Constitutional: Negative for fatigue, fever and unexpected weight change.   HENT: Negative for facial swelling, mouth sores, sore throat, tinnitus, trouble swallowing and voice change.    Respiratory: Negative for cough, choking, chest tightness, shortness of breath and wheezing.    Cardiovascular: Negative for chest pain, palpitations and leg swelling.   Gastrointestinal: Negative for abdominal distention, abdominal pain, anal bleeding, blood in stool, constipation, diarrhea, nausea, rectal pain and vomiting.   Genitourinary: Negative for difficulty urinating, dysuria, frequency and hematuria.   Musculoskeletal: Negative for arthralgias, back pain, gait problem, joint swelling, myalgias and neck pain.   Skin: Negative for pallor, rash and wound.   Neurological: Negative for dizziness, seizures, syncope, speech difficulty, weakness, light-headedness, numbness and headaches. Hematological: Negative for adenopathy. Does not bruise/bleed easily.   Psychiatric/Behavioral: Negative for dysphoric mood and sleep disturbance. The patient is not nervous/anxious.      Medical History:   Diagnosis Date   ??? Cancer (HCC)     bone/ right leg   ??? Diverticulosis    ??? Emphysema lung (HCC)    ??? GERD (gastroesophageal reflux disease)    ??? Hepatitis    ??? Hepatocellular carcinoma (HCC)    ??? Immune disorder (HCC)    ??? Nonalcoholic steatohepatitis    ??? Pulmonary nodule, left    ??? 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 Squamous cell cancer of lip          PAST SURGICAL HISTORY:  Reviewed today and no changes were noted.    SOCIAL HISTORY:  Reviewed  today and no changes were noted.    FAMILY HISTORY:  Reviewed  today and no changes were noted.      Allergies   Allergen Reactions   ??? Ciprofloxacin BLISTERS         Objective:         ??? aspirin EC 81 mg tablet Take 81 mg by mouth daily. Take with food.   ??? azaTHIOprine (IMURAN) 50 mg tablet Take one tablet by mouth at bedtime daily. Indications: liver inflammation resulting from an abnormal immune response   ???  calcium carbonate (TUMS) 500 mg (200 mg elemental calcium) chewable tablet Chew 500 mg by mouth daily as needed.   ??? furosemide (LASIX) 20 mg tablet Take one tablet by mouth twice daily.   ??? lactulose 10 gram/15 mL oral solution Take 20 g by mouth. Two to three times daily (titrates to 2-3 bowel movements daily)   ??? rifAXIMin (XIFAXAN) 550 mg tablet Take one tablet by mouth every 12 hours.   ??? spironolactone (ALDACTONE) 50 mg tablet Take one tablet by mouth daily with breakfast. Take with food.   ??? trimethoprim/sulfamethoxazole (BACTRIM DS) 160/800 mg tablet Take one tablet by mouth three times weekly. Mon, Wed, Fri   Indications: spontaneous bacterial peritonitis     Vitals:    03/13/18 1452 03/13/18 1501   BP: 123/78    Patient Position: Sitting    Pulse: 81    Resp: 18    Temp: 36.7 ???C (98.1 ???F)    SpO2: 100%    Weight: 78.2 kg (172 lb 6.4 oz) Left upper body: No supraclavicular adenopathy.   Skin:     General: Skin is warm.      Findings: No petechiae or rash. Rash is not purpuric.   Neurological:      Mental Status: She is alert and oriented to person, place, and time.      Cranial Nerves: No cranial nerve deficit.      Sensory: No sensory deficit.      Coordination: Coordination normal.      Deep Tendon Reflexes: Reflexes are normal and symmetric.   Psychiatric:         Mood and Affect: Mood is not anxious or depressed.         Behavior: Behavior normal.         Thought Content: Thought content normal.         Judgment: Judgment normal.                   Assessment and Plan:  Problem List        Oncology    Hepatocellular carcinoma [facility])    Overview       ???  She was admitted to the hospital in 02/2017 with decompensated liver failure. Her MELD at that time was 27. She is currently still on prednisone taper with plans to start azathioprine soon  ???  ???  ???  03/14/17 - AFP = 22.4 and 05/09/17 = 8.3  ???  03/15/17 - MRI ABD impression Redemonstration of 1.7 cm LI RADS 3 observation in segment 5/6 of the liver with arterial enhancement. No appreciable washout or additional significant signal abnormality.  Heterogeneous perfusion pattern throughout the liver without additional discrete mass. Follow-up CT or MRI abdomen (liver protocol) in 3 months recommended. Cirrhosis with small portosystemic varices. No splenomegaly or ascites. ???  ???  03/15/17 Liver biopsy  SURG PATH #: [id number]  A.  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 ???   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   See comment.   ???  03/29/17 - Liver biopsy  SURG PATH #: [id number]  A. Liver, mass, biopsy: ??? Negative for mass/lesion.  ??? 4//8/19 Liver biopsy SURG PATH #: [id number] A. Liver mass, biopsy: Well differentiated hepatocellular carcinoma. ??? ???   ???  05/30/17 mri pelvis IMPRESSION ???Three  soft tissue nodules within the right pelvis that are unchanged since February 2019. ???These most likely represent metastases from the reported sarcoma. ???Consider biopsy of the largest nodule, as clinically indicated.      ???05/31/17 US Guided bx of the target right gluteal soft tissue nodule  ???Successful ultrasound guided biopsy of the target right gluteal soft tissue mass. ???Four core biopsy specimens submitted to pathology in formalin  ???  05/31/17 PATHOLOGY SURG PATH #: [id number] Diagnosis:  A. Nerve bundles, pelvic lymph node biopsy, core needle biopsy: ??? Abundant nerve bundles. See comment.        She is to also have MWA along with TACE for LI-Rads 5 lesion -   08/22/17 Successful CT -guided Microwave Ablation of hepatic segment 5/6 lesion                  Other    Liver cirrhosis secondary to NASH [facility])                         Hepatocellular carcinoma biopsy proven.  Status post her second chemoembolization tolerated that very well.  We discussed the results of her scans today showing no evidence of residual enhancement.  Her LI-RADS 3 lesion is still stable we will continue to follow.  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  We will continue surveillance with scans every 6 months.    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  I would recommend following this closely over time with serial scans  for

## 2018-03-12 ENCOUNTER — Encounter: Admit: 2018-03-12 | Discharge: 2018-03-12 | Payer: MEDICARE

## 2018-03-13 ENCOUNTER — Encounter: Admit: 2018-03-13 | Discharge: 2018-03-13 | Payer: MEDICARE

## 2018-03-13 ENCOUNTER — Ambulatory Visit: Admit: 2018-03-13 | Discharge: 2018-03-13 | Payer: MEDICARE

## 2018-03-13 ENCOUNTER — Encounter: Admit: 2018-03-13 | Discharge: 2018-03-14 | Payer: MEDICARE

## 2018-03-13 DIAGNOSIS — J439 Emphysema, unspecified: ICD-10-CM

## 2018-03-13 DIAGNOSIS — C499 Malignant neoplasm of connective and soft tissue, unspecified: ICD-10-CM

## 2018-03-13 DIAGNOSIS — D899 Disorder involving the immune mechanism, unspecified: ICD-10-CM

## 2018-03-13 DIAGNOSIS — C220 Liver cell carcinoma: Principal | ICD-10-CM

## 2018-03-13 DIAGNOSIS — Z7952 Long term (current) use of systemic steroids: ICD-10-CM

## 2018-03-13 DIAGNOSIS — C801 Malignant (primary) neoplasm, unspecified: ICD-10-CM

## 2018-03-13 DIAGNOSIS — K7581 Nonalcoholic steatohepatitis (NASH): ICD-10-CM

## 2018-03-13 DIAGNOSIS — K7460 Unspecified cirrhosis of liver: ICD-10-CM

## 2018-03-13 DIAGNOSIS — K5790 Diverticulosis of intestine, part unspecified, without perforation or abscess without bleeding: ICD-10-CM

## 2018-03-13 DIAGNOSIS — E559 Vitamin D deficiency, unspecified: ICD-10-CM

## 2018-03-13 DIAGNOSIS — K754 Autoimmune hepatitis: ICD-10-CM

## 2018-03-13 DIAGNOSIS — K759 Inflammatory liver disease, unspecified: Principal | ICD-10-CM

## 2018-03-13 DIAGNOSIS — C4402 Squamous cell carcinoma of skin of lip: ICD-10-CM

## 2018-03-13 DIAGNOSIS — K219 Gastro-esophageal reflux disease without esophagitis: ICD-10-CM

## 2018-03-13 DIAGNOSIS — R911 Solitary pulmonary nodule: ICD-10-CM

## 2018-03-13 LAB — COMPREHENSIVE METABOLIC PANEL
Lab: 100 U/L (ref 25–110)
Lab: 11 U/L (ref 7–56)
Lab: 133 MMOL/L — ABNORMAL LOW (ref >60)
Lab: 22 U/L (ref 7–40)
Lab: 26 MMOL/L (ref 21–30)
Lab: 4.2 MMOL/L (ref >60)
Lab: 55 mL/min — ABNORMAL LOW (ref >60)
Lab: 7 10*3/uL — ABNORMAL LOW (ref 3–12)
Lab: >60 mL/min (ref >60)

## 2018-03-13 LAB — PROTIME INR (PT): Lab: 1.1 MMOL/L (ref 0.8–1.2)

## 2018-03-13 LAB — CBC AND DIFF
Lab: 0 10*3/uL (ref 0–0.20)
Lab: 3.7 M/UL — ABNORMAL LOW (ref 4.0–5.0)
Lab: 5.5 K/UL (ref 4.5–11.0)

## 2018-03-13 LAB — POC CREATININE, RAD: Lab: 1 mg/dL (ref 0.4–1.00)

## 2018-03-13 LAB — ALPHA FETO PROTEIN (AFP): Lab: 3.2 ng/mL (ref 0.0–15.0)

## 2018-03-13 MED ORDER — SODIUM CHLORIDE 0.9 % IJ SOLN
50 mL | Freq: Once | INTRAVENOUS | 0 refills | Status: CP
Start: 2018-03-13 — End: ?
  Administered 2018-03-13: 18:00:00 50 mL via INTRAVENOUS

## 2018-03-13 MED ORDER — GADOXETATE 0.25 MMOL/ML (181.43 MG/ML) IV SOLN
10 mL | Freq: Once | INTRAVENOUS | 0 refills | Status: CP
Start: 2018-03-13 — End: ?
  Administered 2018-03-13: 18:00:00 10 mL via INTRAVENOUS

## 2018-03-14 LAB — 25-OH VITAMIN D (D2 + D3): Lab: 23 ng/mL — ABNORMAL LOW (ref 30–80)

## 2018-03-24 ENCOUNTER — Encounter: Admit: 2018-03-24 | Discharge: 2018-03-24 | Payer: MEDICARE

## 2018-03-24 DIAGNOSIS — C499 Malignant neoplasm of connective and soft tissue, unspecified: ICD-10-CM

## 2018-03-24 DIAGNOSIS — K219 Gastro-esophageal reflux disease without esophagitis: ICD-10-CM

## 2018-03-24 DIAGNOSIS — K7581 Nonalcoholic steatohepatitis (NASH): ICD-10-CM

## 2018-03-24 DIAGNOSIS — C4402 Squamous cell carcinoma of skin of lip: ICD-10-CM

## 2018-03-24 DIAGNOSIS — K759 Inflammatory liver disease, unspecified: Principal | ICD-10-CM

## 2018-03-24 DIAGNOSIS — R911 Solitary pulmonary nodule: ICD-10-CM

## 2018-03-24 DIAGNOSIS — D899 Disorder involving the immune mechanism, unspecified: ICD-10-CM

## 2018-03-24 DIAGNOSIS — C801 Malignant (primary) neoplasm, unspecified: ICD-10-CM

## 2018-03-24 DIAGNOSIS — K5790 Diverticulosis of intestine, part unspecified, without perforation or abscess without bleeding: ICD-10-CM

## 2018-03-24 DIAGNOSIS — C220 Liver cell carcinoma: ICD-10-CM

## 2018-03-24 DIAGNOSIS — J439 Emphysema, unspecified: ICD-10-CM

## 2018-03-27 ENCOUNTER — Encounter: Admit: 2018-03-27 | Discharge: 2018-03-27 | Payer: MEDICARE

## 2018-03-27 DIAGNOSIS — E559 Vitamin D deficiency, unspecified: ICD-10-CM

## 2018-03-27 DIAGNOSIS — D899 Disorder involving the immune mechanism, unspecified: ICD-10-CM

## 2018-03-27 DIAGNOSIS — K7581 Nonalcoholic steatohepatitis (NASH): ICD-10-CM

## 2018-03-27 DIAGNOSIS — Z7952 Long term (current) use of systemic steroids: Principal | ICD-10-CM

## 2018-03-29 ENCOUNTER — Encounter: Admit: 2018-03-29 | Discharge: 2018-03-29 | Payer: MEDICARE

## 2018-04-15 ENCOUNTER — Encounter: Admit: 2018-04-15 | Discharge: 2018-04-15 | Payer: MEDICARE

## 2018-04-15 MED ORDER — SULFAMETHOXAZOLE-TRIMETHOPRIM 800-160 MG PO TAB
1 | ORAL_TABLET | ORAL | 3 refills | Status: AC
Start: 2018-04-15 — End: 2018-08-08

## 2018-04-18 ENCOUNTER — Encounter: Admit: 2018-04-18 | Discharge: 2018-04-18 | Payer: MEDICARE

## 2018-04-19 ENCOUNTER — Encounter: Admit: 2018-04-19 | Discharge: 2018-04-19 | Payer: MEDICARE

## 2018-04-19 DIAGNOSIS — E559 Vitamin D deficiency, unspecified: Principal | ICD-10-CM

## 2018-04-19 MED ORDER — ERGOCALCIFEROL (VITAMIN D2) 1,250 MCG (50,000 UNIT) PO CAP
1 | ORAL_CAPSULE | ORAL | 2 refills | 56.00000 days | Status: AC
Start: 2018-04-19 — End: 2018-09-25

## 2018-05-03 ENCOUNTER — Encounter: Admit: 2018-05-03 | Discharge: 2018-05-03 | Payer: MEDICARE

## 2018-05-06 ENCOUNTER — Encounter: Admit: 2018-05-06 | Discharge: 2018-05-06 | Payer: MEDICARE

## 2018-05-06 MED ORDER — FUROSEMIDE 20 MG PO TAB
20 mg | ORAL_TABLET | Freq: Two times a day (BID) | ORAL | 5 refills | 90.00000 days | Status: DC
Start: 2018-05-06 — End: 2018-11-08

## 2018-05-10 ENCOUNTER — Encounter: Admit: 2018-05-10 | Discharge: 2018-05-10 | Payer: MEDICARE

## 2018-05-17 ENCOUNTER — Encounter: Admit: 2018-05-17 | Discharge: 2018-05-17 | Payer: MEDICARE

## 2018-05-17 MED ORDER — RIFAXIMIN 550 MG PO TAB
550 mg | ORAL_TABLET | Freq: Two times a day (BID) | ORAL | 11 refills | 30.00000 days | Status: DC
Start: 2018-05-17 — End: 2018-08-19

## 2018-06-17 NOTE — Progress Notes
??? Cancer (HCC)     bone/ right leg   ??? Diverticulosis    ??? Emphysema lung (HCC)    ??? GERD (gastroesophageal reflux disease)    ??? Hepatitis    ??? Hepatocellular carcinoma (HCC)    ??? Immune disorder (HCC)    ??? Nonalcoholic steatohepatitis    ??? Pulmonary nodule, left    ??? 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 Squamous cell cancer of lip          PAST SURGICAL HISTORY:  Reviewed today and no changes were noted.    SOCIAL HISTORY:  Reviewed  today and no changes were noted.    FAMILY HISTORY:  Reviewed  today and no changes were noted.      Allergies   Allergen Reactions   ??? Ciprofloxacin BLISTERS         Objective:         ??? aspirin EC 81 mg tablet Take 81 mg by mouth daily. Take with food.   ??? azaTHIOprine (IMURAN) 50 mg tablet Take one tablet by mouth at bedtime daily. Indications: liver inflammation resulting from an abnormal immune response   ??? calcium carbonate (TUMS) 500 mg (200 mg elemental calcium) chewable tablet Chew 500 mg by mouth daily as needed.   ??? ergocalciferol (VITAMIN D) 1,250 mcg (50,000 unit) capsule Take one capsule by mouth every 7 days.   ??? furosemide (LASIX) 20 mg tablet Take one tablet by mouth twice daily.   ??? lactulose 10 gram/15 mL oral solution Take 30 mL by mouth twice daily. Two to three times daily (titrates to 2-3 bowel movements daily)   ??? rifAXIMin (XIFAXAN) 550 mg tablet Take one tablet by mouth every 12 hours.   ??? spironolactone (ALDACTONE) 50 mg tablet Take one tablet by mouth daily with breakfast. Take with food.   ??? trimethoprim/sulfamethoxazole (BACTRIM DS) 160/800 mg tablet Take one tablet by mouth three times weekly. Mon, Wed, Fri   Indications: spontaneous bacterial peritonitis     Vitals:    06/19/18 1447 06/19/18 1449   BP:  133/73   BP Source:  Arm, Right Upper   Pulse:  88   Resp:  16   Temp:  36.4 ???C (97.5 ???F)   TempSrc:  Temporal   SpO2:  99%   Weight:  81.1 kg (178 lb 12.8 oz)   Height:  162.6 cm (64)   PainSc: Zero      Body mass index is 30.69 kg/m???. Pain Score: Zero       Fatigue Scale: 0-None    Pain Addressed:  N/A    Patient Evaluated for a Clinical Trial: No treatment clinical trial available for this patient.     [place] Cooperative Oncology Group performance status is 1, Restricted in physically strenuous activity but ambulatory and able to carry out work of a light or sedentary nature, e.g., light house work, office work.     Physical Exam  Constitutional:       General: She is not in acute distress.     Appearance: Normal appearance. She is well-developed.   HENT:      Head: Normocephalic and atraumatic.   Eyes:      General: No scleral icterus.        Right eye: No discharge.      Pupils: Pupils are equal, round, and reactive to light.   Neck:      Musculoskeletal: Full passive range of motion without pain,  normal range of motion and neck supple.      Thyroid: No thyromegaly.   Cardiovascular:      Rate and Rhythm: Normal rate and regular rhythm.      Heart sounds: Normal heart sounds. No murmur. No friction rub.   Pulmonary:      Effort: No respiratory distress.      Breath sounds: Normal breath sounds. No wheezing or rales.   Abdominal:      General: Bowel sounds are normal. There is no distension.      Palpations: Abdomen is soft. There is no hepatomegaly, splenomegaly or mass.      Tenderness: There is no abdominal tenderness. There is no guarding or rebound.      Hernia: No hernia is present.   Musculoskeletal:         General: No tenderness.      Comments: Right lower limb amputation   Lymphadenopathy:      Head:      Right side of head: No submental, submandibular, tonsillar, preauricular, posterior auricular or occipital adenopathy.      Left side of head: No submental, submandibular, tonsillar, preauricular, posterior auricular or occipital adenopathy.      Cervical: No cervical adenopathy.      Upper Body:      Right upper body: No supraclavicular adenopathy.      Left upper body: No supraclavicular adenopathy.   Skin: differentiated hepatocellular carcinoma. ??? ???   ???  05/30/17 mri pelvis IMPRESSION ???Three soft tissue nodules within the right pelvis that are unchanged since February 2019. ???These most likely represent metastases from the reported sarcoma. ???Consider biopsy of the largest nodule, as clinically indicated.      ???05/31/17 US Guided bx of the target right gluteal soft tissue nodule  ???Successful ultrasound guided biopsy of the target right gluteal soft tissue mass. ???Four core biopsy specimens submitted to pathology in formalin  ???  05/31/17 PATHOLOGY SURG PATH #: [id number] Diagnosis:  A. Nerve bundles, pelvic lymph node biopsy, core needle biopsy: ??? Abundant nerve bundles. See comment.        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 Lung, left upper lobe wedge mass, wedge resection: ???   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                 Other    Immunosuppression (HCC)                   1. Hepatocellular carcinoma biopsy proven.   - Status post her second chemoembolization tolerated that very well.    - She almost 1 year since her last liver targeted treatment.   - MRI reviewed with no sign of recurrence pending final radiology report.  - a fetoprotein still low at 3.3  - We will see her back in 3 months with MRI of the abdomen with and without contrast    2. Abnormal lung nodule of the left upper lobe suspicious for primary neoplasm of the lung .  - S/p resection stage Ia non-small cell squamous cell carcinoma of the  lung.   - We will continue surveillance with scans every 6 months. Will check with her next visit in 3 months.    3. 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     4. Autoimmune hepatitis   - Following up with Dr. Ladona Ridgel well compensated.    Case was discussed with Dr Tamsen Meek.    Osama Diab  Hematology Oncology fellow, PGY5  [facility]   Pager:[phone]      Attestation by Dayton Bailiff, MD:  I interviewed and examined this patient with Dr. Sinclair Grooms and confirmed his history and physical findings.  I have personally reviewed the lab data and x-ray studies.  Together we formulated the assessment and plans outlined below.

## 2018-06-18 ENCOUNTER — Encounter: Admit: 2018-06-18 | Discharge: 2018-06-18 | Payer: MEDICARE

## 2018-06-18 DIAGNOSIS — K7581 Nonalcoholic steatohepatitis (NASH): Principal | ICD-10-CM

## 2018-06-18 DIAGNOSIS — C220 Liver cell carcinoma: ICD-10-CM

## 2018-06-19 ENCOUNTER — Encounter: Admit: 2018-06-19 | Discharge: 2018-06-20 | Payer: MEDICARE

## 2018-06-19 ENCOUNTER — Encounter: Admit: 2018-06-19 | Discharge: 2018-06-19 | Payer: MEDICARE

## 2018-06-19 DIAGNOSIS — C3490 Malignant neoplasm of unspecified part of unspecified bronchus or lung: ICD-10-CM

## 2018-06-19 DIAGNOSIS — C3412 Malignant neoplasm of upper lobe, left bronchus or lung: ICD-10-CM

## 2018-06-19 DIAGNOSIS — D899 Disorder involving the immune mechanism, unspecified: ICD-10-CM

## 2018-06-19 DIAGNOSIS — K754 Autoimmune hepatitis: ICD-10-CM

## 2018-06-19 DIAGNOSIS — C220 Liver cell carcinoma: Principal | ICD-10-CM

## 2018-06-19 DIAGNOSIS — K7581 Nonalcoholic steatohepatitis (NASH): ICD-10-CM

## 2018-06-19 DIAGNOSIS — C499 Malignant neoplasm of connective and soft tissue, unspecified: ICD-10-CM

## 2018-06-19 DIAGNOSIS — C801 Malignant (primary) neoplasm, unspecified: ICD-10-CM

## 2018-06-19 DIAGNOSIS — K5790 Diverticulosis of intestine, part unspecified, without perforation or abscess without bleeding: ICD-10-CM

## 2018-06-19 DIAGNOSIS — K219 Gastro-esophageal reflux disease without esophagitis: ICD-10-CM

## 2018-06-19 DIAGNOSIS — J439 Emphysema, unspecified: ICD-10-CM

## 2018-06-19 DIAGNOSIS — K759 Inflammatory liver disease, unspecified: Principal | ICD-10-CM

## 2018-06-19 DIAGNOSIS — C4402 Squamous cell carcinoma of skin of lip: ICD-10-CM

## 2018-06-19 DIAGNOSIS — R911 Solitary pulmonary nodule: ICD-10-CM

## 2018-06-19 LAB — COMPREHENSIVE METABOLIC PANEL
Lab: 0.5 mg/dL (ref 0.3–1.2)
Lab: 0.9 mg/dL (ref 0.4–1.00)
Lab: 10 U/L (ref 7–56)
Lab: 100 U/L (ref 25–110)
Lab: 12 mg/dL (ref 7–25)
Lab: 135 MMOL/L — ABNORMAL LOW (ref 137–147)
Lab: 20 U/L (ref 7–40)
Lab: 26 MMOL/L (ref 21–30)
Lab: 4.1 MMOL/L (ref 3.5–5.1)
Lab: 4.1 g/dL — ABNORMAL LOW (ref 3.5–5.0)
Lab: 59 mL/min — ABNORMAL LOW (ref >60)
Lab: 7.8 g/dL (ref 6.0–8.0)
Lab: 8 K/UL (ref 3–12)
Lab: 89 mg/dL (ref 70–100)
Lab: 9.4 mg/dL (ref 8.5–10.6)
Lab: >60 mL/min (ref >60)

## 2018-06-19 LAB — CBC AND DIFF
Lab: 0.1 10*3/uL (ref 0–0.20)
Lab: 3.8 M/UL — ABNORMAL LOW (ref 4.0–5.0)
Lab: 5.7 10*3/uL (ref 4.5–11.0)

## 2018-06-19 LAB — POC CREATININE, RAD: Lab: 0.9 mg/dL (ref 0.4–1.00)

## 2018-06-19 LAB — ALPHA FETO PROTEIN (AFP): Lab: 3.3 ng/mL (ref 0.0–15.0)

## 2018-06-19 LAB — PROTIME INR (PT): Lab: 1.1 MMOL/L (ref 0.8–1.2)

## 2018-06-19 MED ORDER — GADOXETATE 0.25 MMOL/ML (181.43 MG/ML) IV SOLN
10 mL | Freq: Once | INTRAVENOUS | 0 refills | Status: CP
Start: 2018-06-19 — End: ?
  Administered 2018-06-19: 19:00:00 10 mL via INTRAVENOUS

## 2018-06-19 MED ORDER — SODIUM CHLORIDE 0.9 % IJ SOLN
50 mL | Freq: Once | INTRAVENOUS | 0 refills | Status: CP
Start: 2018-06-19 — End: ?
  Administered 2018-06-19: 19:00:00 50 mL via INTRAVENOUS

## 2018-06-19 MED ORDER — LACTULOSE 10 GRAM/15 ML PO SOLN
20 g | Freq: Two times a day (BID) | ORAL | 11 refills | 21.00000 days | Status: DC
Start: 2018-06-19 — End: 2019-09-02

## 2018-08-02 ENCOUNTER — Encounter: Admit: 2018-08-02 | Discharge: 2018-08-02

## 2018-08-07 ENCOUNTER — Encounter: Admit: 2018-08-07 | Discharge: 2018-08-07

## 2018-08-08 ENCOUNTER — Encounter: Admit: 2018-08-08 | Discharge: 2018-08-08

## 2018-08-08 MED ORDER — SULFAMETHOXAZOLE-TRIMETHOPRIM 800-160 MG PO TAB
1 | ORAL_TABLET | ORAL | 5 refills | Status: DC
Start: 2018-08-08 — End: 2019-01-20

## 2018-08-08 MED ORDER — SPIRONOLACTONE 50 MG PO TAB
50 mg | ORAL_TABLET | Freq: Every day | ORAL | 5 refills | 46.00000 days | Status: DC
Start: 2018-08-08 — End: 2019-01-20

## 2018-08-17 ENCOUNTER — Encounter: Admit: 2018-08-17 | Discharge: 2018-08-17

## 2018-08-19 ENCOUNTER — Encounter: Admit: 2018-08-19 | Discharge: 2018-08-19

## 2018-08-19 MED ORDER — RIFAXIMIN 550 MG PO TAB
550 mg | ORAL_TABLET | Freq: Two times a day (BID) | ORAL | 11 refills | 30.00000 days | Status: DC
Start: 2018-08-19 — End: 2019-07-07

## 2018-08-19 NOTE — Progress Notes
Pharmacy notified. [place], LPN II.

## 2018-08-19 NOTE — Progress Notes
PA submitted through [email or web address] for Xifaxan. Awaiting response from Patient's insurance. Will update chart upon notification. [place], LPN II

## 2018-09-23 NOTE — Progress Notes
Name: Sarah Pineda          MRN: [id number]      DOB: 1950-08-05      AGE: 68 y.o.   DATE OF SERVICE: 09/25/2018    Subjective:             Reason for Visit:  Follow Up      Sarah Pineda is a 68 y.o. female.     Cancer Staging  Hepatocellular carcinoma (HCC)  Staging form: Liver, AJCC 8th Edition  - Clinical: cT1, cN0, cM0 - Signed by Dayton Bailiff, MD on 06/06/2017    Non-small cell lung cancer (HCC)  Staging form: Lung, AJCC 8th Edition  - Pathologic: Stage IA2 (pT1b, pN0, cM0) - Signed by Dayton Bailiff, MD on 08/03/2017  - Pathologic: No stage assigned - Unsigned      History of Present Illness    Sarah Pineda doing well denies any new complaints denies any ascites or encephalopathy.  Denies any new lumps or bumps under the skin no bone pains or aches.         Review of Systems   Constitutional: Negative for fatigue, fever and unexpected weight change.   HENT: Negative for facial swelling, mouth sores, sore throat, tinnitus, trouble swallowing and voice change.    Respiratory: Negative for cough, choking, chest tightness, shortness of breath and wheezing.    Cardiovascular: Negative for chest pain, palpitations and leg swelling.   Gastrointestinal: Negative for abdominal distention, abdominal pain, anal bleeding, blood in stool, constipation, diarrhea, nausea, rectal pain and vomiting.   Genitourinary: Negative for difficulty urinating, dysuria, frequency and hematuria.   Musculoskeletal: Negative for arthralgias, back pain, gait problem, joint swelling, myalgias and neck pain.   Skin: Negative for pallor, rash and wound.   Neurological: Negative for dizziness, seizures, syncope, speech difficulty, weakness, light-headedness, numbness and headaches.   Hematological: Negative for adenopathy. Does not bruise/bleed easily.   Psychiatric/Behavioral: Negative for dysphoric mood and sleep disturbance. The patient is not nervous/anxious.      Medical History:   Diagnosis Date   ??? Cancer (HCC)     bone/ right leg ??? Diverticulosis    ??? Emphysema lung (HCC)    ??? GERD (gastroesophageal reflux disease)    ??? Hepatitis    ??? Hepatocellular carcinoma (HCC)    ??? Immune disorder (HCC)    ??? Nonalcoholic steatohepatitis    ??? Pulmonary nodule, left    ??? 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 Squamous cell cancer of lip          PAST SURGICAL HISTORY:  Reviewed today and no changes were noted.    SOCIAL HISTORY:  Reviewed  today and no changes were noted.    FAMILY HISTORY:  Reviewed  today and no changes were noted.      Allergies   Allergen Reactions   ??? Ciprofloxacin BLISTERS   ??? Latex RASH and REDNESS     Pt states it tore up her skin         Objective:         ??? aspirin EC 81 mg tablet Take 81 mg by mouth daily. Take with food.   ??? azaTHIOprine (IMURAN) 50 mg tablet Take one tablet by mouth at bedtime daily. Indications: liver inflammation resulting from an abnormal immune response   ??? calcium carbonate (TUMS) 500 mg (200 mg elemental calcium) chewable tablet Chew 500 mg by mouth daily as needed.   ???  furosemide (LASIX) 20 mg tablet Take one tablet by mouth twice daily.   ??? lactulose 10 gram/15 mL oral solution Take 30 mL by mouth twice daily. Two to three times daily (titrates to 2-3 bowel movements daily)   ??? rifAXIMin (XIFAXAN) 550 mg tablet Take one tablet by mouth every 12 hours.   ??? spironolactone (ALDACTONE) 50 mg tablet Take one tablet by mouth daily with breakfast. Take with food.   ??? trimethoprim/sulfamethoxazole (BACTRIM DS) 160/800 mg tablet Take one tablet by mouth three times weekly. Mon, Wed, Fri   Indications: spontaneous bacterial peritonitis     Vitals:    09/25/18 1354 09/25/18 1355   BP:  139/83   BP Source:  Arm, Left Upper   Pulse:  85   Resp:  14   Temp:  36.7 ???C (98.1 ???F)   SpO2:  99%   Weight:  81.9 kg (180 lb 9.6 oz)   Height:  162.6 cm (64)   PainSc: Zero      Body mass index is 31 kg/m???.     Pain Score: Zero       Fatigue Scale: 0-None    Pain Addressed:  N/A Patient Evaluated for a Clinical Trial: No treatment clinical trial available for this patient.     [place] Cooperative Oncology Group performance status is 1, Restricted in physically strenuous activity but ambulatory and able to carry out work of a light or sedentary nature, e.g., light house work, office work.     Physical Exam  Constitutional:       General: She is not in acute distress.     Appearance: Normal appearance. She is well-developed.   HENT:      Head: Normocephalic and atraumatic.   Eyes:      General: No scleral icterus.        Right eye: No discharge.      Pupils: Pupils are equal, round, and reactive to light.   Neck:      Musculoskeletal: Full passive range of motion without pain, normal range of motion and neck supple.      Thyroid: No thyromegaly.   Cardiovascular:      Rate and Rhythm: Normal rate and regular rhythm.      Heart sounds: Normal heart sounds. No murmur. No friction rub.   Pulmonary:      Effort: No respiratory distress.      Breath sounds: Normal breath sounds. No wheezing or rales.   Abdominal:      General: Bowel sounds are normal. There is no distension.      Palpations: Abdomen is soft. There is no hepatomegaly, splenomegaly or mass.      Tenderness: There is no abdominal tenderness. There is no guarding or rebound.      Hernia: No hernia is present.   Musculoskeletal:         General: No tenderness.      Comments: Right lower limb amputation   Lymphadenopathy:      Head:      Right side of head: No submental, submandibular, tonsillar, preauricular, posterior auricular or occipital adenopathy.      Left side of head: No submental, submandibular, tonsillar, preauricular, posterior auricular or occipital adenopathy.      Cervical: No cervical adenopathy.      Upper Body:      Right upper body: No supraclavicular adenopathy.      Left upper body: No supraclavicular adenopathy.   Skin:     General: Skin  is warm.      Findings: No petechiae or rash. Rash is not purpuric. Neurological:      Mental Status: She is alert and oriented to person, place, and time.      Cranial Nerves: No cranial nerve deficit.      Sensory: No sensory deficit.      Coordination: Coordination normal.      Deep Tendon Reflexes: Reflexes are normal and symmetric.   Psychiatric:         Mood and Affect: Mood is not anxious or depressed.         Behavior: Behavior normal.         Thought Content: Thought content normal.         Judgment: Judgment normal.                   Assessment and Plan:  Problem List        Oncology    Hepatocellular carcinoma [facility])    Overview       ???  She was admitted to the hospital in 02/2017 with decompensated liver failure. Her MELD at that time was 27. She is currently still on prednisone taper with plans to start azathioprine soon  ???  ???  ???  03/14/17 - AFP = 22.4 and 05/09/17 = 8.3  ???  03/15/17 - MRI ABD impression Redemonstration of 1.7 cm LI RADS 3 observation in segment 5/6 of the liver with arterial enhancement. No appreciable washout or additional significant signal abnormality.  Heterogeneous perfusion pattern throughout the liver without additional discrete mass. Follow-up CT or MRI abdomen (liver protocol) in 3 months recommended. Cirrhosis with small portosystemic varices. No splenomegaly or ascites. ???  ???  03/15/17 Liver biopsy  SURG PATH #: [id number]  A.  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 ???   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   See comment.   ???  03/29/17 - Liver biopsy  SURG PATH #: [id number]  A. Liver, mass, biopsy: ??? Negative for mass/lesion.  ???  4//8/19 Liver biopsy SURG PATH #: [id number] A. Liver mass, biopsy: Well differentiated hepatocellular carcinoma. ??? ???   ???  05/30/17 mri pelvis IMPRESSION ???Three soft tissue nodules within the right pelvis that are unchanged since February 2019. ???These most likely represent metastases from the reported sarcoma. ???Consider biopsy of the largest nodule, as clinically indicated.      ???05/31/17 US Guided bx of the target right gluteal soft tissue nodule  ???Successful ultrasound guided biopsy of the target right gluteal soft tissue mass. ???Four core biopsy specimens submitted to pathology in formalin  ???  05/31/17 PATHOLOGY SURG PATH #: [id number] Diagnosis:  A. Nerve bundles, pelvic lymph node biopsy, core needle biopsy: ??? Abundant nerve bundles. See comment.        She is to also have MWA along with TACE for LI-Rads 5 lesion -   08/22/17 Successful CT -guided Microwave Ablation of hepatic segment 5/6 lesion               Non-small cell lung cancer (HCC)                   1. Hepatocellular carcinoma biopsy proven.   - Status post her second chemoembolization tolerated that very well.    - She almost  1 year since her last liver targeted treatment.   - MRI MRI shows no evidence of recurrent disease area treated in segment 5 6 shows no residual enhancement.  There are several smaller LI-RADS lesions throughout the liver that are unchanged.  - a fetoprotein still low at 3.3  - We will see her back in 3 months with MRI of the abdomen with and without contrast    2. Abnormal lung nodule of the left upper lobe suspicious for primary neoplasm of the lung .  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   -Lung nodules are stable consistent with granulomas we will continue scans every 6 months.    3. 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     4. Autoimmune hepatitis   - Following up with Dr. Ladona Ridgel well compensated. Follow-up in 3 months with CBC CMP MRI of the abdomen with and without contrast alpha-fetoprotein coags.

## 2018-09-25 ENCOUNTER — Encounter: Admit: 2018-09-25 | Discharge: 2018-09-26

## 2018-09-25 ENCOUNTER — Encounter: Admit: 2018-09-25 | Discharge: 2018-09-25

## 2018-09-25 DIAGNOSIS — C220 Liver cell carcinoma: Secondary | ICD-10-CM

## 2018-09-25 DIAGNOSIS — C801 Malignant (primary) neoplasm, unspecified: Secondary | ICD-10-CM

## 2018-09-25 DIAGNOSIS — J439 Emphysema, unspecified: Secondary | ICD-10-CM

## 2018-09-25 DIAGNOSIS — C3490 Malignant neoplasm of unspecified part of unspecified bronchus or lung: Secondary | ICD-10-CM

## 2018-09-25 DIAGNOSIS — E559 Vitamin D deficiency, unspecified: Secondary | ICD-10-CM

## 2018-09-25 DIAGNOSIS — R911 Solitary pulmonary nodule: Secondary | ICD-10-CM

## 2018-09-25 DIAGNOSIS — C3412 Malignant neoplasm of upper lobe, left bronchus or lung: Secondary | ICD-10-CM

## 2018-09-25 DIAGNOSIS — C499 Malignant neoplasm of connective and soft tissue, unspecified: Secondary | ICD-10-CM

## 2018-09-25 DIAGNOSIS — Z85830 Personal history of malignant neoplasm of bone: Secondary | ICD-10-CM

## 2018-09-25 DIAGNOSIS — K754 Autoimmune hepatitis: Secondary | ICD-10-CM

## 2018-09-25 DIAGNOSIS — D899 Disorder involving the immune mechanism, unspecified: Secondary | ICD-10-CM

## 2018-09-25 DIAGNOSIS — K759 Inflammatory liver disease, unspecified: Secondary | ICD-10-CM

## 2018-09-25 DIAGNOSIS — K7581 Nonalcoholic steatohepatitis (NASH): Secondary | ICD-10-CM

## 2018-09-25 DIAGNOSIS — C4402 Squamous cell carcinoma of skin of lip: Secondary | ICD-10-CM

## 2018-09-25 DIAGNOSIS — K219 Gastro-esophageal reflux disease without esophagitis: Secondary | ICD-10-CM

## 2018-09-25 DIAGNOSIS — K5790 Diverticulosis of intestine, part unspecified, without perforation or abscess without bleeding: Secondary | ICD-10-CM

## 2018-09-25 LAB — POC CREATININE, RAD: Lab: 1.1 mg/dL — ABNORMAL HIGH (ref 0.4–1.00)

## 2018-09-25 LAB — COMPREHENSIVE METABOLIC PANEL
Lab: 0.6 mg/dL — ABNORMAL LOW (ref 0.3–1.2)
Lab: 10 mg/dL (ref 8.5–10.6)
Lab: 113 U/L — ABNORMAL HIGH (ref 25–110)
Lab: 135 MMOL/L — ABNORMAL LOW (ref 137–147)
Lab: 14 mg/dL (ref 7–25)
Lab: 19 U/L (ref 7–40)
Lab: 28 MMOL/L (ref 21–30)
Lab: 4.1 MMOL/L (ref 3.5–5.1)
Lab: 4.3 g/dL (ref 3.5–5.0)
Lab: 55 mL/min — ABNORMAL LOW (ref >60)
Lab: 7.8 g/dL (ref 6.0–8.0)
Lab: 8 K/UL (ref 3–12)
Lab: 8 U/L — ABNORMAL LOW (ref 7–56)
Lab: 94 mg/dL (ref 70–100)
Lab: 99 MMOL/L (ref 98–110)
Lab: >60 mL/min (ref >60)

## 2018-09-25 LAB — PROTIME INR (PT): Lab: 1.1 mg/dL (ref 0.8–1.2)

## 2018-09-25 LAB — CBC AND DIFF
Lab: 13 g/dL (ref 12.0–15.0)
Lab: 3.9 M/UL — ABNORMAL LOW (ref 4.0–5.0)
Lab: 5.8 10*3/uL (ref 4.5–11.0)

## 2018-09-25 LAB — ALPHA FETO PROTEIN (AFP): Lab: 4.6 ng/mL (ref 0.0–15.0)

## 2018-09-25 MED ORDER — SODIUM CHLORIDE 0.9 % IJ SOLN
50 mL | Freq: Once | INTRAVENOUS | 0 refills | Status: CP
Start: 2018-09-25 — End: ?
  Administered 2018-09-25: 15:00:00 50 mL via INTRAVENOUS

## 2018-09-25 MED ORDER — SODIUM CHLORIDE 0.9 % IJ SOLN
50 mL | Freq: Once | INTRAVENOUS | 0 refills | Status: CP
Start: 2018-09-25 — End: ?
  Administered 2018-09-25: 16:00:00 50 mL via INTRAVENOUS

## 2018-09-25 MED ORDER — IOHEXOL 350 MG IODINE/ML IV SOLN
70 mL | Freq: Once | INTRAVENOUS | 0 refills | Status: CP
Start: 2018-09-25 — End: ?
  Administered 2018-09-25: 16:00:00 70 mL via INTRAVENOUS

## 2018-09-25 MED ORDER — GADOXETATE 0.25 MMOL/ML (181.43 MG/ML) IV SOLN
10 mL | Freq: Once | INTRAVENOUS | 0 refills | Status: CP
Start: 2018-09-25 — End: ?
  Administered 2018-09-25: 15:00:00 10 mL via INTRAVENOUS

## 2018-09-25 NOTE — Patient Instructions
Dr.Raed Al-Rajabi MD Medical Oncologist specializing in Gastro-Intestinal malignancies   Molly Silver APRN  Phone: [phone] Monday-Friday 8:00am- 4:00pm Nurses: Lauren Barker BSN RN and   BSN RN   After Hours Phone: [phone]- On call number for urgent needs outside of business hours ask for the On call Oncology fellow to be paged and they will call you back  Medication refills: please contact your pharmacy for medication refills, if they do not have any refills on file they will contact the office, make sure they have our correct contact information on file P: [phone], F: [phone]  MyChart Messages: All mychart messages are answered by the nurses, even if you select to send the message to Dr. Al-Rajabi or Molly. We often communicate with Dr. Al-Rajabi or Molly regarding how to answer these messages. Messages are answered 8:00am-4:00pm Monday-Friday.  Phone Calls: We are typically not at our desks where our phones are located as we are in clinic. Please leave us a message as we check our messages several times per day. It is our goal to answer these messages as soon as possible. Messages are answered from 8:00am-4:00 pm Monday-Friday. Please make sure you leave your full name with the spelling, date of birth, and reason for your call when leaving a message.

## 2018-11-08 ENCOUNTER — Encounter: Admit: 2018-11-08 | Discharge: 2018-11-08 | Payer: MEDICARE

## 2018-11-08 MED ORDER — FUROSEMIDE 20 MG PO TAB
20 mg | ORAL_TABLET | Freq: Two times a day (BID) | ORAL | 11 refills | 90.00000 days | Status: AC
Start: 2018-11-08 — End: ?

## 2018-11-08 MED ORDER — AZATHIOPRINE 50 MG PO TAB
50 mg | ORAL_TABLET | Freq: Every evening | ORAL | 3 refills | Status: AC
Start: 2018-11-08 — End: ?

## 2018-12-16 ENCOUNTER — Encounter: Admit: 2018-12-16 | Discharge: 2018-12-16 | Payer: MEDICARE

## 2018-12-30 NOTE — Progress Notes
Name: Sarah Pineda          MRN: [id number]      DOB: 1950-06-11      AGE: 68 y.o.   DATE OF SERVICE: 01/03/2019    Subjective:             Reason for Visit:  Follow Up      Sarah Pineda is a 68 y.o. female.     Cancer Staging  Hepatocellular carcinoma (HCC)  Staging form: Liver, AJCC 8th Edition  - Clinical: cT1, cN0, cM0 - Signed by Dayton Bailiff, MD on 06/06/2017    Non-small cell lung cancer (HCC)  Staging form: Lung, AJCC 8th Edition  - Pathologic: Stage IA2 (pT1b, pN0, cM0) - Signed by Dayton Bailiff, MD on 08/03/2017  - Pathologic: No stage assigned - Unsigned      History of Present Illness    [name] doing well denies any new complaints denies any ascites or encephalopathy.  Denies any new lumps or bumps under the skin no bone pains or aches.  She has lost some weight but she is ambulating more on her legs around the house and trying to be active.         Review of Systems   Constitutional: Negative for fatigue, fever and unexpected weight change.   HENT: Negative for facial swelling, mouth sores, sore throat, tinnitus, trouble swallowing and voice change.    Respiratory: Negative for cough, choking, chest tightness, shortness of breath and wheezing.    Cardiovascular: Negative for chest pain, palpitations and leg swelling.   Gastrointestinal: Negative for abdominal distention, abdominal pain, anal bleeding, blood in stool, constipation, diarrhea, nausea, rectal pain and vomiting.   Genitourinary: Negative for difficulty urinating, dysuria, frequency and hematuria.   Musculoskeletal: Negative for arthralgias, back pain, gait problem, joint swelling, myalgias and neck pain.   Skin: Negative for pallor, rash and wound.   Neurological: Negative for dizziness, seizures, syncope, speech difficulty, weakness, light-headedness, numbness and headaches.   Hematological: Negative for adenopathy. Does not bruise/bleed easily. Psychiatric/Behavioral: Negative for dysphoric mood and sleep disturbance. The patient is not nervous/anxious.      Medical History:   Diagnosis Date   ? Cancer (HCC)     bone/ right leg   ? Diverticulosis    ? Emphysema lung (HCC)    ? GERD (gastroesophageal reflux disease)    ? Hepatitis    ? Hepatocellular carcinoma (HCC)    ? Immune disorder (HCC)    ? Nonalcoholic steatohepatitis    ? Pulmonary nodule, left    ? 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 Squamous cell cancer of lip          PAST SURGICAL HISTORY:  Reviewed today and no changes were noted.    SOCIAL HISTORY:  Reviewed  today and no changes were noted.    FAMILY HISTORY:  Reviewed  today and no changes were noted.      Allergies   Allergen Reactions   ? Ciprofloxacin BLISTERS   ? Latex RASH and REDNESS     Pt states it tore up her skin         Objective:         ? aspirin EC 81 mg tablet Take 81 mg by mouth daily. Take with food.   ? azaTHIOprine (IMURAN) 50 mg tablet Take one tablet by mouth at bedtime daily. Indications: liver inflammation resulting from an abnormal immune response   ?  calcium carbonate (TUMS) 500 mg (200 mg elemental calcium) chewable tablet Chew 500 mg by mouth daily as needed.   ? furosemide (LASIX) 20 mg tablet Take one tablet by mouth twice daily.   ? lactulose 10 gram/15 mL oral solution Take 30 mL by mouth twice daily. Two to three times daily (titrates to 2-3 bowel movements daily)   ? rifAXIMin (XIFAXAN) 550 mg tablet Take one tablet by mouth every 12 hours.   ? spironolactone (ALDACTONE) 50 mg tablet Take one tablet by mouth daily with breakfast. Take with food.   ? trimethoprim/sulfamethoxazole (BACTRIM DS) 160/800 mg tablet Take one tablet by mouth three times weekly. Mon, Wed, Fri   Indications: spontaneous bacterial peritonitis     Vitals:    01/03/19 1129   BP: 115/69   BP Source: Arm, Right Upper   Patient Position: Sitting   Pulse: 82   Resp: 16   Temp: 36.7 ?C (98.1 ?F)   TempSrc: Oral   SpO2: 97% Weight: 78.7 kg (173 lb 9.6 oz)   Height: 162.6 cm (64.02)   PainSc: Zero     Body mass index is 29.78 kg/m?Marland Kitchen     Pain Score: Zero       Fatigue Scale: 0-None    Pain Addressed:  N/A    Patient Evaluated for a Clinical Trial: No treatment clinical trial available for this patient.     [place] Cooperative Oncology Group performance status is 1, Restricted in physically strenuous activity but ambulatory and able to carry out work of a light or sedentary nature, e.g., light house work, office work.     Physical Exam  Constitutional:       General: She is not in acute distress.     Appearance: Normal appearance. She is well-developed.   HENT:      Head: Normocephalic and atraumatic.   Eyes:      General: No scleral icterus.        Right eye: No discharge.      Pupils: Pupils are equal, round, and reactive to light.   Neck:      Musculoskeletal: Full passive range of motion without pain, normal range of motion and neck supple.      Thyroid: No thyromegaly.   Cardiovascular:      Rate and Rhythm: Normal rate and regular rhythm.      Heart sounds: Normal heart sounds. No murmur. No friction rub.   Pulmonary:      Effort: No respiratory distress.      Breath sounds: Normal breath sounds. No wheezing or rales.   Abdominal:      General: Bowel sounds are normal. There is no distension.      Palpations: Abdomen is soft. There is no hepatomegaly, splenomegaly or mass.      Tenderness: There is no abdominal tenderness. There is no guarding or rebound.      Hernia: No hernia is present.   Musculoskeletal:         General: No tenderness.      Comments: Right lower limb amputation   Lymphadenopathy:      Head:      Right side of head: No submental, submandibular, tonsillar, preauricular, posterior auricular or occipital adenopathy.      Left side of head: No submental, submandibular, tonsillar, preauricular, posterior auricular or occipital adenopathy.      Cervical: No cervical adenopathy.      Upper Body: Right upper body: No supraclavicular adenopathy.  Left upper body: No supraclavicular adenopathy.   Skin:     General: Skin is warm.      Findings: No petechiae or rash. Rash is not purpuric.   Neurological:      Mental Status: She is alert and oriented to person, place, and time.      Cranial Nerves: No cranial nerve deficit.      Sensory: No sensory deficit.      Coordination: Coordination normal.      Deep Tendon Reflexes: Reflexes are normal and symmetric.   Psychiatric:         Mood and Affect: Mood is not anxious or depressed.         Behavior: Behavior normal.         Thought Content: Thought content normal.         Judgment: Judgment normal.                   Assessment and Plan:  Problem List        Oncology    Hepatocellular carcinoma [facility])    Overview       ?  She was admitted to the hospital in 02/2017 with decompensated liver failure. Her MELD at that time was 27. She is currently still on prednisone taper with plans to start azathioprine soon  ?  ?  ?  03/14/17 - AFP = 22.4 and 05/09/17 = 8.3  ?  03/15/17 - MRI ABD impression Redemonstration of 1.7 cm LI RADS 3 observation in segment 5/6 of the liver with arterial enhancement. No appreciable washout or additional significant signal abnormality.  Heterogeneous perfusion pattern throughout the liver without additional discrete mass. Follow-up CT or MRI abdomen (liver protocol) in 3 months recommended. Cirrhosis with small portosystemic varices. No splenomegaly or ascites. ?  ?  03/15/17 Liver biopsy  SURG PATH #: [id number]  A.  Chronic hepatitis with moderate to severe interface and lobular activity with lymphocytic and lympho- plasmacytic infiltrates in the portal tracts and extending into the lobule and areas of confluent hepatocyte necrosis/collapse, compatible with ? ? autoimmune hepatitis. ?   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   See comment.   ?  03/29/17 - Liver biopsy SURG PATH #: [id number]  A. Liver, mass, biopsy: ? Negative for mass/lesion.  ?  4//8/19 Liver biopsy SURG PATH #: [id number] A. Liver mass, biopsy: Well differentiated hepatocellular carcinoma. ? ?   ?  05/30/17 mri pelvis IMPRESSION ?Three soft tissue nodules within the right pelvis that are unchanged since February 2019. ?These most likely represent metastases from the reported sarcoma. ?Consider biopsy of the largest nodule, as clinically indicated.      ?05/31/17 US Guided bx of the target right gluteal soft tissue nodule  ?Successful ultrasound guided biopsy of the target right gluteal soft tissue mass. ?Four core biopsy specimens submitted to pathology in formalin  ?  05/31/17 PATHOLOGY SURG PATH #: [id number] Diagnosis:  A. Nerve bundles, pelvic lymph node biopsy, core needle biopsy: ? Abundant nerve bundles. See comment.        She is to also have MWA along with TACE for LI-Rads 5 lesion -   08/22/17 Successful CT -guided Microwave Ablation of hepatic segment 5/6 lesion               Non-small cell lung cancer (HCC)                   1. Hepatocellular carcinoma biopsy proven.   -  Status post her second chemoembolization tolerated that very well.    - She almost 1 year since her last liver targeted treatment.   - MRI MRI shows no evidence of recurrent disease area treated in segment 5 6 shows no residual enhancement.  The official report was still pending at the time of her visit later came back to show hepatobiliary phase defect along the anterior lateral part of her treated lesion we will discuss at our multidisciplinary liver tumor board.  There are several smaller LI-RADS lesions throughout the liver that are unchanged.  - a fetoprotein still low at 3.3  - We will see her back in 3 months with MRI of the abdomen with and without contrast    2. Abnormal lung nodule of the left upper lobe suspicious for primary neoplasm of the lung . 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   -Lung nodules are stable consistent with granulomas we will continue scans every 6 months.  ?We will obtain a CT scan of the chest with contrast next visit.    3. 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     4. Autoimmune hepatitis   - Following up with Dr. Ladona Ridgel well compensated.    Follow-up in 3 months with CBC CMP MRI of the abdomen with and without contrast CT scan of the chest with contrast alpha-fetoprotein coags.

## 2019-01-03 ENCOUNTER — Encounter: Admit: 2019-01-03 | Discharge: 2019-01-03 | Payer: MEDICARE

## 2019-01-03 ENCOUNTER — Ambulatory Visit: Admit: 2019-01-03 | Discharge: 2019-01-03 | Payer: MEDICARE

## 2019-01-03 DIAGNOSIS — C801 Malignant (primary) neoplasm, unspecified: Secondary | ICD-10-CM

## 2019-01-03 DIAGNOSIS — K7581 Nonalcoholic steatohepatitis (NASH): Secondary | ICD-10-CM

## 2019-01-03 DIAGNOSIS — C220 Liver cell carcinoma: Secondary | ICD-10-CM

## 2019-01-03 DIAGNOSIS — D899 Disorder involving the immune mechanism, unspecified: Secondary | ICD-10-CM

## 2019-01-03 DIAGNOSIS — C499 Malignant neoplasm of connective and soft tissue, unspecified: Secondary | ICD-10-CM

## 2019-01-03 DIAGNOSIS — J439 Emphysema, unspecified: Secondary | ICD-10-CM

## 2019-01-03 DIAGNOSIS — K219 Gastro-esophageal reflux disease without esophagitis: Secondary | ICD-10-CM

## 2019-01-03 DIAGNOSIS — R911 Solitary pulmonary nodule: Secondary | ICD-10-CM

## 2019-01-03 DIAGNOSIS — C4402 Squamous cell carcinoma of skin of lip: Secondary | ICD-10-CM

## 2019-01-03 DIAGNOSIS — C3490 Malignant neoplasm of unspecified part of unspecified bronchus or lung: Secondary | ICD-10-CM

## 2019-01-03 DIAGNOSIS — K5790 Diverticulosis of intestine, part unspecified, without perforation or abscess without bleeding: Secondary | ICD-10-CM

## 2019-01-03 DIAGNOSIS — K759 Inflammatory liver disease, unspecified: Secondary | ICD-10-CM

## 2019-01-03 LAB — CBC AND DIFF
Lab: 0.1 10*3/uL (ref 0–0.20)
Lab: 0.1 10*3/uL (ref 0–0.45)
Lab: 0.3 10*3/uL (ref 0–0.80)
Lab: 0.6 10*3/uL — ABNORMAL LOW (ref 1.0–4.8)
Lab: 1 % (ref >60)
Lab: 1 % (ref >60)
Lab: 10 % — ABNORMAL LOW (ref 24–44)
Lab: 13 % (ref 11–15)
Lab: 3.5 M/UL — ABNORMAL LOW (ref 4.0–5.0)
Lab: 33 % — ABNORMAL LOW (ref 36–45)
Lab: 33 pg (ref 26–34)
Lab: 5 % (ref 4–12)
Lab: 5 10*3/uL (ref 1.8–7.0)
Lab: 6.1 10*3/uL (ref 4.5–11.0)
Lab: 8.5 FL (ref 7–11)
Lab: 83 % — ABNORMAL HIGH (ref 41–77)

## 2019-01-03 LAB — COMPREHENSIVE METABOLIC PANEL
Lab: 0.6 mg/dL (ref 0.3–1.2)
Lab: 105 U/L (ref 25–110)
Lab: 13 mg/dL — ABNORMAL LOW (ref 7–25)
Lab: 135 MMOL/L — ABNORMAL LOW (ref 137–147)
Lab: 4 MMOL/L (ref 3.5–5.1)
Lab: 9.4 mg/dL (ref 8.5–10.6)

## 2019-01-03 LAB — PROTIME INR (PT): Lab: 1.1 (ref 0.8–1.2)

## 2019-01-03 MED ORDER — SODIUM CHLORIDE 0.9 % IJ SOLN
50 mL | Freq: Once | INTRAVENOUS | 0 refills | Status: CP
Start: 2019-01-03 — End: ?
  Administered 2019-01-03: 16:00:00 50 mL via INTRAVENOUS

## 2019-01-03 MED ORDER — GADOXETATE 0.25 MMOL/ML (181.43 MG/ML) IV SOLN
10 mL | Freq: Once | INTRAVENOUS | 0 refills | Status: CP
Start: 2019-01-03 — End: ?
  Administered 2019-01-03: 16:00:00 10 mL via INTRAVENOUS

## 2019-01-06 ENCOUNTER — Encounter: Admit: 2019-01-06 | Discharge: 2019-01-06 | Payer: MEDICARE

## 2019-01-06 NOTE — Progress Notes
Patient discussed 01/06/19 in [facility] Conference.     Presented by: Lenise Arena, MD    Pertinent information reviewed:        a.  MRI Abdomen - Internal  Date:  12/11        b . Liver Segments/Measurements/LIRADS classifications:           c.  Tbili: 0.6        d. AFP level: 5.2        e. Previous treatment(s) (if applicable):     -MWA 5/6 lesion 08/22/17          f.  Proposed recommendations:            1. MWA seg 5/6 lesion              Called pt to notify her of recommendations. Spoke w/ her daughter regarding plan. She is agreeable and will notify pt.

## 2019-01-07 ENCOUNTER — Encounter: Admit: 2019-01-07 | Discharge: 2019-01-07 | Payer: MEDICARE

## 2019-01-08 ENCOUNTER — Encounter: Admit: 2019-01-08 | Discharge: 2019-01-08 | Payer: MEDICARE

## 2019-01-08 DIAGNOSIS — C220 Liver cell carcinoma: Secondary | ICD-10-CM

## 2019-01-08 NOTE — Progress Notes
Encounter opened in error

## 2019-01-10 ENCOUNTER — Encounter: Admit: 2019-01-10 | Discharge: 2019-01-10 | Payer: MEDICARE

## 2019-01-10 NOTE — Telephone Encounter
Patient call voicing concern that she had not yet heard from IR regarding ablation . Explained to Patient that referral placed on 12/16 may take a week before it is scheduled due to review must be done by IR MD . Also explained due to holidays it may take longer . She is to call 12/28 if she has not heard anything. Did discuss January appt w/ AL-Rajabi may have to be pushed depending date of ablation procedure. Patient voiced understanding

## 2019-01-13 ENCOUNTER — Encounter: Admit: 2019-01-13 | Discharge: 2019-01-13 | Payer: MEDICARE

## 2019-01-13 NOTE — Telephone Encounter
Received IR referral for retreatment with MWA segment 5/6 liver lesion. Discussed in [facility] conference 01/06/19.  Reviewed by Dr. Theda Sers who agrees with this plan.     Phone call made to patient to schedule MWA procedure.  LVM for patient to return call to this [place].  Call back phone number provided.

## 2019-01-20 ENCOUNTER — Encounter: Admit: 2019-01-20 | Discharge: 2019-01-20 | Payer: MEDICARE

## 2019-01-20 MED ORDER — SULFAMETHOXAZOLE-TRIMETHOPRIM 800-160 MG PO TAB
1 | ORAL_TABLET | ORAL | 5 refills | Status: DC
Start: 2019-01-20 — End: 2019-04-02

## 2019-01-20 MED ORDER — SPIRONOLACTONE 50 MG PO TAB
50 mg | ORAL_TABLET | Freq: Every day | ORAL | 5 refills | 90.00000 days | Status: DC
Start: 2019-01-20 — End: 2019-08-11

## 2019-01-23 ENCOUNTER — Encounter: Admit: 2019-01-23 | Discharge: 2019-01-23 | Payer: MEDICARE

## 2019-01-23 NOTE — Progress Notes
COVID 19 and pre procedure lab orders faxed to [facility] @ [phone]. Patient is scheduled for IR procedure 02/06/18. Patient to be notified.

## 2019-01-29 NOTE — Telephone Encounter
Sarah Pineda reached out to clinic to advise that pt may be confused regarding upcoming pre-procedure COVID testing to be done at their facility on January 13th prior to IR procedure 02/07/2019. She spent 30 minutes explaining procure for the test and concerned patient still seemed confused. They are to send results to this clinic.

## 2019-01-30 ENCOUNTER — Encounter: Admit: 2019-01-30 | Discharge: 2019-01-30 | Payer: MEDICARE

## 2019-01-30 ENCOUNTER — Ambulatory Visit: Admit: 2019-01-30 | Discharge: 2019-01-31 | Payer: MEDICARE

## 2019-01-30 DIAGNOSIS — J439 Emphysema, unspecified: Secondary | ICD-10-CM

## 2019-01-30 DIAGNOSIS — C801 Malignant (primary) neoplasm, unspecified: Secondary | ICD-10-CM

## 2019-01-30 DIAGNOSIS — K219 Gastro-esophageal reflux disease without esophagitis: Secondary | ICD-10-CM

## 2019-01-30 DIAGNOSIS — Z20822 Encounter for screening laboratory testing for COVID-19 virus in asymptomatic patient: Secondary | ICD-10-CM

## 2019-01-30 DIAGNOSIS — K7581 Nonalcoholic steatohepatitis (NASH): Secondary | ICD-10-CM

## 2019-01-30 DIAGNOSIS — R911 Solitary pulmonary nodule: Secondary | ICD-10-CM

## 2019-01-30 DIAGNOSIS — C220 Liver cell carcinoma: Secondary | ICD-10-CM

## 2019-01-30 DIAGNOSIS — D899 Disorder involving the immune mechanism, unspecified: Secondary | ICD-10-CM

## 2019-01-30 DIAGNOSIS — K5790 Diverticulosis of intestine, part unspecified, without perforation or abscess without bleeding: Secondary | ICD-10-CM

## 2019-01-30 DIAGNOSIS — C499 Malignant neoplasm of connective and soft tissue, unspecified: Secondary | ICD-10-CM

## 2019-01-30 DIAGNOSIS — C4402 Squamous cell carcinoma of skin of lip: Secondary | ICD-10-CM

## 2019-01-30 DIAGNOSIS — K759 Inflammatory liver disease, unspecified: Secondary | ICD-10-CM

## 2019-01-30 NOTE — Anesthesia Pre-Procedure Evaluation
Anesthesia Pre-Procedure Evaluation    Name: Sarah Pineda      MRN: [id number]     DOB: [birth year]     Age: 69 y.o.     Sex: female   __________________________________________________________________________     Procedure Date: 07/23/17  Procedure:  Liver MWA     Physical Assessment  Vital Signs (last filed in past 24 hours):         Patient History  Allergies   Allergen Reactions   ? Ciprofloxacin BLISTERS   ? Latex RASH and REDNESS     Pt states it tore up her skin      Current Medications    Medication Directions   aspirin EC 81 mg tablet Take 81 mg by mouth daily after lunch. Take with food.    azaTHIOprine (IMURAN) 50 mg tablet Take one tablet by mouth at bedtime daily. Indications: liver inflammation resulting from an abnormal immune response   furosemide (LASIX) 20 mg tablet Take one tablet by mouth twice daily.   lactulose 10 gram/15 mL oral solution Take 30 mL by mouth twice daily. Two to three times daily (titrates to 2-3 bowel movements daily)   oxyCODONE-acetaminophen (PERCOCET) 7.5-325 mg tablet Take 1 tablet by mouth every 12 hours as needed for Pain   rifAXIMin (XIFAXAN) 550 mg tablet Take one tablet by mouth every 12 hours.   spironolactone (ALDACTONE) 50 mg tablet Take one tablet by mouth daily with breakfast. Take with food.   trimethoprim/sulfamethoxazole (BACTRIM DS) 160/800 mg tablet Take one tablet by mouth three times weekly. Mon, Wed, Fri   Indications: spontaneous bacterial peritonitis     Medical History:   Diagnosis Date   ? Cancer (HCC)     bone/ right leg   ? Diverticulosis    ? Emphysema lung (HCC)    ? GERD (gastroesophageal reflux disease)     Tums PRN   ? Hepatitis    ? Hepatocellular carcinoma (HCC)    ? Immune disorder (HCC)    ? Nonalcoholic steatohepatitis    ? Pulmonary nodule, left    ? 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 Squamous cell cancer of lip      Surgical History:   Procedure Laterality Date   ? AMPUTATION  1970    right leg-bone cancer ? HX TUBAL LIGATION  1977   ? HX CHOLECYSTECTOMY  2004   ? ESOPHAGOGASTRODUODENOSCOPY WITH BIOPSY - FLEXIBLE N/A 05/18/2017    Performed by Virgina Organ, MD at [facility] ENDO   ? COLONOSCOPY DIAGNOSTIC WITH SPECIMEN COLLECTION BY BRUSHING/ WASHING - FLEXIBLE N/A 05/18/2017    Performed by Virgina Organ, MD at [facility] ENDO   ? LUNG SURGERY  07/24/2017    removal of lung wedge   ? left video assisted thoracoscopy with wedge resection Left 07/24/2017    Performed by Bryson Dames, MD at [facility] CVOR   ? LIVER SURGERY  06/2017, 07/2017    x 2 Ablasion    ? LYMPH NODE BIOPSY      back   ? SKIN CANCER EXCISION      sqc- lip and nose     Social History     Tobacco Use   ? Smoking status: Former Smoker     Packs/day: 0.50     Years: 50.00     Pack years: 25.00     Types: Cigarettes     Quit date: 03/10/2017     Years since quitting: 1.8   ?  Smokeless tobacco: Never Used   Substance Use Topics   ? Alcohol use: Yes     Comment: couple times/year   ? Drug use: Never     Review of Systems/Medical History      Patient summary reviewed  Nursing notes reviewed  Pertinent labs reviewed    PONV Screening: Female gender, Non-smoker and Postoperative opioids  No history of anesthetic complications  No family history of anesthetic complications      Airway - negative        No TMJ      Direct laryngoscopy, Rapid sequence, Stylet; Single-Lumen, Cuffed; 7mm; Mac; 3; Oral; 1-Full view of the glottis (with cricoid pressure); 1 insertion attempt      Pulmonary       Not a current smoker ( Quit in Feb 2019 with 35 PYH)        No indications/hx of asthma    COPD ( Not requiring treatment)      No recent URI      07/24/17- left thoracoscopy wedge resection, (+) non small cell lung carcinoma. Post wedge resection pneumothorax      09/25/18 CT Chest  1. ?No evidence of thoracic metastatic disease. 2. ?Several tiny right lung nodules remain stable since at least May 16, 2017, most compatible scars or granulomas. No new or enlarging pulmonary nodules are identified.   3. ?Mild emphysema and scattered areas of scarring with stable wedge resection changes in the posterior left upper lobe.   4. ?At least mild coronary artery calcification.      Cardiovascular - negative        Exercise tolerance: <4 METS (Uses cruches with one leg. Can walk 2 blocks without chest  pain. stairs are difficult. 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 HE is currently well managed with Lactulose)      Ascites ( Not requiring paracentesis. 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 hx HCC s/p ablation. lung ca s/p wedge resection 2019)    Constitution - negative   Physical Exam    Airway Findings      Mallampati: I      TM distance: >3 FB      Neck ROM: full      Mouth opening: good      Airway patency: adequate    Dental Findings:       Poor dentition and increased risk for dental injury; pt advised          Neurological Findings: Negative      Alert and oriented x 3    Constitutional findings: Negative      No acute distress      Well-developed       Diagnostic Tests  Hematology:   Lab Results   Component Value Date    HGB 11.9 01/03/2019    HCT 33.8 01/03/2019    PLTCT 202 01/03/2019    WBC 6.1 01/03/2019    NEUT 83 01/03/2019    ANC 5.00 01/03/2019    LYMPH 25.7 06/04/2017    ALC 0.60 01/03/2019    MONA 5 01/03/2019    AMC 0.30 01/03/2019    EOSA 1 01/03/2019    ABC 0.10 01/03/2019    BASOPHILS 0.8 06/04/2017    MCV 94.4 01/03/2019    MCH 33.2 01/03/2019    MCHC 35.1 01/03/2019    MPV 8.5 01/03/2019    RDW 13.3 01/03/2019         General Chemistry:   Lab Results   Component Value Date    NA 135 01/03/2019    K 4.0 01/03/2019    CL 102 01/03/2019    CO2 24 01/03/2019    GAP 9 01/03/2019    BUN 13 01/03/2019    CR 0.82 01/03/2019    GLU 100 01/03/2019    GLU 93 08/20/2017    CA 9.4 01/03/2019    ALBUMIN 4.1 01/03/2019    MG 1.9 07/23/2017    TOTBILI 0.6 01/03/2019    PO4 4.2 07/23/2017      Coagulation:   Lab Results   Component Value Date    PT 12.0 08/20/2017    INR 1.1 01/03/2019         Anesthesia Plan    ASA score: 3   Plan: general  Induction method: intravenous      Informed Consent  Use of blood products discussed with patient              LAB: 01/03/19 CBC/d, INR, CMP reviewed, WNL, except HGB 11.9 T&C DOS  Labs collected in [employer] on 01/29/19- ordered by [name] ([facility])  ROI: [facility] hospital for 01/29/19 labs  Consult: none

## 2019-01-31 ENCOUNTER — Encounter: Admit: 2019-01-31 | Discharge: 2019-01-31 | Payer: MEDICARE

## 2019-01-31 DIAGNOSIS — Z20822 Encounter for screening laboratory testing for COVID-19 virus in asymptomatic patient: Secondary | ICD-10-CM

## 2019-02-04 ENCOUNTER — Encounter: Admit: 2019-02-04 | Discharge: 2019-02-04 | Payer: MEDICARE

## 2019-02-06 ENCOUNTER — Encounter: Admit: 2019-02-06 | Discharge: 2019-02-06 | Payer: MEDICARE

## 2019-02-06 DIAGNOSIS — C220 Liver cell carcinoma: Secondary | ICD-10-CM

## 2019-02-06 DIAGNOSIS — C3490 Malignant neoplasm of unspecified part of unspecified bronchus or lung: Secondary | ICD-10-CM

## 2019-02-06 NOTE — Telephone Encounter
Phone call made to patient to inform that COVID results had been received from [facility] and that the results were negative.  Have not received lab results from [facility] that were done last week. This CNC to call to get results faxed.

## 2019-02-07 ENCOUNTER — Encounter: Admit: 2019-02-07 | Discharge: 2019-02-07 | Payer: MEDICARE

## 2019-02-07 ENCOUNTER — Ambulatory Visit: Admit: 2019-02-07 | Discharge: 2019-02-07 | Payer: MEDICARE

## 2019-02-07 DIAGNOSIS — C4402 Squamous cell carcinoma of skin of lip: Secondary | ICD-10-CM

## 2019-02-07 DIAGNOSIS — R911 Solitary pulmonary nodule: Secondary | ICD-10-CM

## 2019-02-07 DIAGNOSIS — D899 Disorder involving the immune mechanism, unspecified: Secondary | ICD-10-CM

## 2019-02-07 DIAGNOSIS — J439 Emphysema, unspecified: Secondary | ICD-10-CM

## 2019-02-07 DIAGNOSIS — K5790 Diverticulosis of intestine, part unspecified, without perforation or abscess without bleeding: Secondary | ICD-10-CM

## 2019-02-07 DIAGNOSIS — C499 Malignant neoplasm of connective and soft tissue, unspecified: Secondary | ICD-10-CM

## 2019-02-07 DIAGNOSIS — K759 Inflammatory liver disease, unspecified: Secondary | ICD-10-CM

## 2019-02-07 DIAGNOSIS — C801 Malignant (primary) neoplasm, unspecified: Secondary | ICD-10-CM

## 2019-02-07 DIAGNOSIS — C220 Liver cell carcinoma: Secondary | ICD-10-CM

## 2019-02-07 DIAGNOSIS — K219 Gastro-esophageal reflux disease without esophagitis: Secondary | ICD-10-CM

## 2019-02-07 DIAGNOSIS — K7581 Nonalcoholic steatohepatitis (NASH): Secondary | ICD-10-CM

## 2019-02-07 MED ORDER — LACTATED RINGERS IV SOLP
0 refills | Status: DC
Start: 2019-02-07 — End: 2019-02-07
  Administered 2019-02-07: 17:00:00 via INTRAVENOUS

## 2019-02-07 MED ORDER — ROCURONIUM 10 MG/ML IV SOLN
INTRAVENOUS | 0 refills | Status: DC
Start: 2019-02-07 — End: 2019-02-07
  Administered 2019-02-07: 17:00:00 30 mg via INTRAVENOUS

## 2019-02-07 MED ORDER — AMPICILLIN/SULBACTAM 3G/100ML NS IVPB (MB+)
3 g | Freq: Once | INTRAVENOUS | 0 refills | Status: CP
Start: 2019-02-07 — End: ?
  Administered 2019-02-07 (×2): 3 g via INTRAVENOUS

## 2019-02-07 MED ORDER — HYDROMORPHONE (PF) 2 MG/ML IJ SYRG
.5 mg | INTRAVENOUS | 0 refills | Status: DC | PRN
Start: 2019-02-07 — End: 2019-02-07

## 2019-02-07 MED ORDER — DEXAMETHASONE SODIUM PHOSPHATE 4 MG/ML IJ SOLN
INTRAVENOUS | 0 refills | Status: DC
Start: 2019-02-07 — End: 2019-02-07
  Administered 2019-02-07: 17:00:00 4 mg via INTRAVENOUS

## 2019-02-07 MED ORDER — OXYCODONE 5 MG PO TAB
5-10 mg | ORAL_TABLET | ORAL | 0 refills | 6.00000 days | Status: DC | PRN
Start: 2019-02-07 — End: 2019-04-02
  Filled 2019-02-07: qty 30, 5d supply, fill #1

## 2019-02-07 MED ORDER — ONDANSETRON HCL 4 MG PO TAB
4-8 mg | ORAL_TABLET | ORAL | 1 refills | 8.00000 days | Status: DC | PRN
Start: 2019-02-07 — End: 2019-04-02
  Filled 2019-02-07: qty 20, 3d supply, fill #1

## 2019-02-07 MED ORDER — DEXTRAN 70-HYPROMELLOSE (PF) 0.1-0.3 % OP DPET
0 refills | Status: DC
Start: 2019-02-07 — End: 2019-02-07
  Administered 2019-02-07: 17:00:00 2 [drp] via OPHTHALMIC

## 2019-02-07 MED ORDER — OXYCODONE 5 MG PO TAB
5-10 mg | Freq: Once | ORAL | 0 refills | Status: CP | PRN
Start: 2019-02-07 — End: ?
  Administered 2019-02-07: 18:00:00 10 mg via ORAL

## 2019-02-07 MED ORDER — PROMETHAZINE 25 MG/ML IJ SOLN
6.25 mg | INTRAVENOUS | 0 refills | Status: DC | PRN
Start: 2019-02-07 — End: 2019-02-07

## 2019-02-07 MED ORDER — METOCLOPRAMIDE HCL 5 MG/ML IJ SOLN
10 mg | Freq: Once | INTRAVENOUS | 0 refills | Status: DC | PRN
Start: 2019-02-07 — End: 2019-02-07

## 2019-02-07 MED ORDER — LIDOCAINE (PF) 200 MG/10 ML (2 %) IJ SYRG
0 refills | Status: DC
Start: 2019-02-07 — End: 2019-02-07
  Administered 2019-02-07: 17:00:00 80 mg via INTRAVENOUS

## 2019-02-07 MED ORDER — PROPOFOL INJ 10 MG/ML IV VIAL
0 refills | Status: DC
Start: 2019-02-07 — End: 2019-02-07
  Administered 2019-02-07: 17:00:00 120 mg via INTRAVENOUS

## 2019-02-07 MED ORDER — SUCCINYLCHOLINE CHLORIDE 20 MG/ML IJ SOLN
INTRAVENOUS | 0 refills | Status: DC
Start: 2019-02-07 — End: 2019-02-07
  Administered 2019-02-07: 17:00:00 100 mg via INTRAVENOUS

## 2019-02-07 MED ORDER — ONDANSETRON HCL (PF) 4 MG/2 ML IJ SOLN
INTRAVENOUS | 0 refills | Status: DC
Start: 2019-02-07 — End: 2019-02-07
  Administered 2019-02-07: 17:00:00 4 mg via INTRAVENOUS

## 2019-02-07 MED ORDER — SUGAMMADEX 100 MG/ML IV SOLN
INTRAVENOUS | 0 refills | Status: DC
Start: 2019-02-07 — End: 2019-02-07
  Administered 2019-02-07: 17:00:00 157 mg via INTRAVENOUS

## 2019-02-07 MED ORDER — FENTANYL CITRATE (PF) 50 MCG/ML IJ SOLN
25 ug | INTRAVENOUS | 0 refills | Status: DC | PRN
Start: 2019-02-07 — End: 2019-02-07
  Administered 2019-02-07 (×2): 25 ug via INTRAVENOUS

## 2019-02-07 MED ORDER — FENTANYL CITRATE (PF) 50 MCG/ML IJ SOLN
0 refills | Status: DC
Start: 2019-02-07 — End: 2019-02-07
  Administered 2019-02-07: 17:00:00 50 ug via INTRAVENOUS

## 2019-02-07 MED ORDER — HALOPERIDOL LACTATE 5 MG/ML IJ SOLN
1 mg | Freq: Once | INTRAVENOUS | 0 refills | Status: DC | PRN
Start: 2019-02-07 — End: 2019-02-07

## 2019-02-07 NOTE — Other
Immediate Post Procedure Note    Date:  02/07/2019                                         Attending Physician:   Lucilla Edin, MD  Performing Provider:  Rachel Bo, DO    Consent:  Consent obtained from patient.  Time out performed: Consent obtained, correct patient verified, correct procedure verified, correct site verified, patient marked as necessary.  Pre/Post Procedure Diagnosis:  Recurrent liver tumor  Indications:  As above    Anesthesia: MAC (Monitored Anesthesia Care)  Procedure(s): Liver mass MWA  Findings:  Successful MWA of R Hepatic lobe mass     Estimated Blood Loss:  None/Negligible  Specimen(s) Removed/Disposition:  None  Complications: None  Patient Tolerated Procedure: Well  Post-Procedure Condition:  stable     , DO  Pager

## 2019-02-07 NOTE — Progress Notes
Anesthesia present for IR procedure. Please refer to anesthesia notes, eMAR, assessments and other documentation for more information. This RN present at bedside for assistance.

## 2019-02-07 NOTE — H&P (View-Only)
IR Pre-Procedure History and Physical/Sedation Plan    Procedure Date: 02/07/2019     Planned Procedure(s):  CT guided MWA of liver lesion, last MWA of liver on 08/22/17    Procedural code status: Full Code    Indication:  HCC  __________________________________________________________________    Chief Complaint:  HCC    History of Present Illness: Sarah Pineda is a 69 y.o. female with a history as listed below who presents today for procedure.    Patient Active Problem List    Diagnosis Date Noted   ? 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 Non-small cell lung cancer (HCC) 08/03/2017   ? Pneumothorax of left lung after biopsy 07/16/2017   ? Amputation of right lower extremity (HCC) 07/16/2017   ? Lung nodules 06/19/2017   ? Hepatocellular carcinoma (HCC) 05/27/2017   ? Immunosuppression (HCC) 05/27/2017   ? Autoimmune hepatitis (HCC) 03/18/2017   ? End stage liver disease (HCC) 03/14/2017   ? Elevated liver enzymes 03/13/2017   ? Elevated INR 03/13/2017   ? Hyponatremia 03/13/2017   ? Liver cirrhosis secondary to NASH (HCC) 03/13/2017   ? History of squamous cell carcinoma 11/28/2010   ? Skin lesion 11/28/2010     Medical History:   Diagnosis Date   ? Cancer (HCC)     bone/ right leg   ? Diverticulosis    ? Emphysema lung (HCC)    ? GERD (gastroesophageal reflux disease)     Tums PRN   ? Hepatitis    ? Hepatocellular carcinoma (HCC)    ? Immune disorder (HCC)    ? Nonalcoholic steatohepatitis    ? Pulmonary nodule, left    ? 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 Squamous cell cancer of lip       Surgical History:   Procedure Laterality Date   ? AMPUTATION  1970    right leg-bone cancer   ? HX TUBAL LIGATION  1977   ? HX CHOLECYSTECTOMY  2004   ? ESOPHAGOGASTRODUODENOSCOPY WITH BIOPSY - FLEXIBLE N/A 05/18/2017    Performed by Virgina Organ, MD at [facility] ENDO   ? COLONOSCOPY DIAGNOSTIC WITH SPECIMEN COLLECTION BY BRUSHING/ WASHING - FLEXIBLE N/A 05/18/2017 Performed by Virgina Organ, MD at [facility] ENDO   ? LUNG SURGERY  07/24/2017    removal of lung wedge   ? left video assisted thoracoscopy with wedge resection Left 07/24/2017    Performed by Bryson Dames, MD at [facility] CVOR   ? LIVER SURGERY  06/2017, 07/2017    x 2 Ablasion    ? LYMPH NODE BIOPSY      back   ? SKIN CANCER EXCISION      sqc- lip and nose      Social History     Tobacco Use   ? Smoking status: Former Smoker     Packs/day: 0.50     Years: 50.00     Pack years: 25.00     Types: Cigarettes     Quit date: 03/10/2017     Years since quitting: 1.9   ? Smokeless tobacco: Never Used   Substance Use Topics   ? Alcohol use: Yes     Comment: couple times/year      Family History   Problem Relation Age of Onset   ? Heart Failure Mother    ? Cancer Mother         Stomach   ? Heart  Failure Father         bypass   ? Heart Disease Father    ? Stroke Brother         age 6   ? Cancer-Colon Brother    ? Aneurysm Brother    ? Diabetes Brother    ? Abnormal EKG Brother         Afib   ? Cancer Sister         Pancreatic   ? Heart Disease Maternal Grandfather    ? Melanoma Neg Hx       Medications Prior to Admission   Medication Sig Dispense Refill Last Dose   ? aspirin EC 81 mg tablet Take 81 mg by mouth daily after lunch. Take with food.    02/06/2019   ? azaTHIOprine (IMURAN) 50 mg tablet Take one tablet by mouth at bedtime daily. Indications: liver inflammation resulting from an abnormal immune response 90 tablet 3 02/06/2019   ? furosemide (LASIX) 20 mg tablet Take one tablet by mouth twice daily. 60 tablet 11 02/06/2019   ? lactulose 10 gram/15 mL oral solution Take 30 mL by mouth twice daily. Two to three times daily (titrates to 2-3 bowel movements daily) 1892 mL 11 02/06/2019   ? oxyCODONE-acetaminophen (PERCOCET) 7.5-325 mg tablet Take 1 tablet by mouth every 12 hours as needed for Pain   >1 Month   ? rifAXIMin (XIFAXAN) 550 mg tablet Take one tablet by mouth every 12 hours. 60 tablet 11 02/06/2019 ? spironolactone (ALDACTONE) 50 mg tablet Take one tablet by mouth daily with breakfast. Take with food. 30 tablet 5 02/06/2019   ? trimethoprim/sulfamethoxazole (BACTRIM DS) 160/800 mg tablet Take one tablet by mouth three times weekly. Mon, Wed, Fri   Indications: spontaneous bacterial peritonitis 12 tablet 5 Past Week     Allergies   Allergen Reactions   ? Ciprofloxacin BLISTERS   ? Latex RASH and REDNESS     Pt states it tore up her skin       Review of Systems  Constitutional: negative for fevers and chills  Respiratory: negative  Cardiovascular: negative  Gastrointestinal: negative for nausea and vomiting    Previous Personal Anesthetic/Sedation History:  Denies adverse events related to sedation/anesthesia.     Previous Family Anesthetic/Sedation History: Denies adverse events related to sedation/anesthesia.    Physical Exam:  Vital Signs: Last Filed In 24 Hours Vital Signs: 24 Hour Range   BP: 118/79 (01/15 0900)  Temp: 36.9 ?C (98.4 ?F) (01/15 0900)  Pulse: 96 (01/15 0900)  Respirations: 24 PER MINUTE (01/15 0900)  SpO2: 97 % (01/15 0900)  SpO2 Pulse: 96 (01/15 0900)  Height: 167.6 cm (66) (01/15 0900) BP: (118)/(79)   Temp:  [36.9 ?C (98.4 ?F)]   Pulse:  [96]   Respirations:  [24 PER MINUTE]   SpO2:  [97 %]           General appearance: Alert and no distress noted.  Neurologic: Grossly normal.  Lungs: Non labored at rest.  Heart: Regular rate and rhythm  Abdomen: Non-distended    Airway: airway assessment performed  Mallampati II (soft palate, uvula, fauces visible)  Head and Neck: no abnormalities noted  Mouth: no abnormalities noted  NPO status: Acceptable  Pregnancy Status: N/A  Anesthesia Classification:  ASA III (A patient with a severe systemic disease that limits activity, but is not incapacitating)  Pre-operative anxiolysis Plan: MAC (Monitored Anesthesia Care) and General Anesthesia  Sedation/Medication Plan: MAC (Monitored Anesthesia Care) and  General Anesthesia Discussion/Reviews:  Physician has discussed risks and alternatives of this type of sedation and above planned procedures with patient    Lab/Radiology/Other Diagnostic Tests:  Labs:  Pertinent labs reviewed- recent OSH labs with plts 215, other labs WNL for procedure today           Dollene Primrose, APRN-NP  Pager [phone]

## 2019-02-07 NOTE — Patient Instructions
INTERVENTIONAL RADIOLOGY AT THE [facility], [place], [place]  DISCHARGE INSTRUCTIONS  MICROWAVE ABLATION   Microwave Ablation is a?procedure that heats and destroys cancer cells.? Imaging techniques such as ultrasound, CT, or MRI are used to help guide a needle electrode into?a tumor.? Microwave currents are then passed through the electrode creating heat that destroys the abnormal cells.? At the same time, heat from the ablation energy closes small blood vessels and lessens the risk of bleeding.?The dead tumor cells are gradually replaced by scar tissue that will shrink over time.? Microwave ablation may cause shoulder pain or flu like symptoms usually lasting no more than?3-5 days.??  AFTER YOUR PROCEDURE:  ? You will recover in Interventional Radiology for a minimum of 2 hours after your procedure.  ? You will be on bedrest with bathroom privileges after the procedure.  POST-PROCEDURE PAIN:   ? Pain control following your procedure is a priority for both you and your Physicians.  ? Some soreness or tenderness at the site is to be expected for several days. We recommend taking over the counter analgesics to help relieve this pain.  ? Alternative methods for pain relief include but not limited to heat or cold compress, relaxation techniques, rest, and changing of positions.?  ? If pain continues after 5-7 days or you have severe pain not relieved by medication, please contact us as directed below.?  POST-PROCEDURE ACTIVITY:   ? A responsible adult must drive you home.  ? If you receive sedation, narcotic pain medication or anesthesia for the procedure, you should not drive or operate heavy machinery or do anything that requires concentration for at least 24 hours after procedure completion.  ? It is recommended that a responsible adult be with you until morning. ? Avoid lifting more than 5 lbs. for 1 week and avoid exercises that use your abdominal muscles.? Also avoid pushing, pulling or straining.??  POST-PROCEDURE SITE CARE:   ? You will have a small bandage over the procedure site.? Keep this dry.?  ? You may remove it in 24 hours.  ? You may shower in 24 hours, after removing the bandage.  ? Do not submerge the procedure site for 1 week (no bathtub, swimming, hot tub, etc.)  ? Do not use ointments, creams or powders on the puncture site.  ? Be sure your hands are clean when touching near the site.?  DIET/MEDICATIONS:   ? You may resume your previous diet after the procedure.  ? If you receive sedation or narcotic pain medications, avoid any foods or beverages containing alcohol for at least 24 hours after the procedure.  ? Please see the Medication Reconciliation sheet for instructions regarding resuming your home medications.??  CALL THE DOCTOR IF:   ? Bright red blood soaks the bandage.  ? You have pain not relieved by medication.? Some soreness at the site is to be expected.  ? You have signs of infection such as: fever greater than 101F, chills, redness, warmth, swelling, drainage or pus from the puncture site.  ? You have persistent nausea or vomiting.  ? You have new or worse belly swelling or bloating.  ? You observe a dark color to your urine  ? You develop yellow coloring to skin and eyes (jaundice).  ?  ?For any of the above symptoms or for problems or concerns related to the procedure, call?[phone] Monday-Friday from 7-5p.? After-hours and weekends, please call?[phone] and ask for the Interventional Radiology Resident on-call.  YOU OR YOUR CAREGIVER  SHOULD CALL 911 FOR ANY SEVERE SYMPTOMS SUCH AS EXCESSIVE BLEEDING, SEVERE DIZZINESS, TROUBLE BREATHING OR LOSS OF CONSCIOUSNESS.  ?  ?

## 2019-02-11 ENCOUNTER — Encounter: Admit: 2019-02-11 | Discharge: 2019-02-11 | Payer: MEDICARE

## 2019-02-11 NOTE — Telephone Encounter
-----   Message from Dinah Beers, RN sent at 02/11/2019  9:13 AM CST -----  Regarding: move scans and apt  Hello    Pt is scheduled for scans and Dr. Arma Heading on 1/27. Can we get those apts moved to 2/12 or the week after? Can you notify her of time? Thank you! She had a liver procedure done on 1/15 and we need scans 4 weeks after. Thanks so much!

## 2019-02-11 NOTE — Telephone Encounter
Follow up phone call made to patient s/p MWA segment 6 hepatic lesion on 02/06/18.  Patient states she had a temperature up to 100.3 x 2 day. She also experienced right shoulder aching for a couple of the days. Patient states she is eating and drinking and has changed from oxycodone to tylenol. Patient is due for 1 month follow up imaging of her liver in 1 mont.    Patient is scheduled 02/19/19 for CT C/A/P, which will be sooner than the recommended 1 month for the liver.  Will contact Dr. Silvestre Gunner office to see if that appointment will be moved out.

## 2019-02-11 NOTE — Telephone Encounter
Attempted to call about appts moving from 1/27 to 2/17. Will attempt again. MyChart message sent. AVS mailed.

## 2019-02-17 ENCOUNTER — Encounter: Admit: 2019-02-17 | Discharge: 2019-02-17 | Payer: MEDICARE

## 2019-02-17 NOTE — Progress Notes
PA initiated via Cover My Meds for Xifaxan 550mg s PO BID.

## 2019-03-12 ENCOUNTER — Encounter: Admit: 2019-03-12 | Discharge: 2019-03-12 | Payer: MEDICARE

## 2019-03-12 ENCOUNTER — Ambulatory Visit: Admit: 2019-03-12 | Discharge: 2019-03-12 | Payer: MEDICARE

## 2019-03-12 DIAGNOSIS — C499 Malignant neoplasm of connective and soft tissue, unspecified: Secondary | ICD-10-CM

## 2019-03-12 DIAGNOSIS — C3412 Malignant neoplasm of upper lobe, left bronchus or lung: Secondary | ICD-10-CM

## 2019-03-12 DIAGNOSIS — C220 Liver cell carcinoma: Secondary | ICD-10-CM

## 2019-03-12 DIAGNOSIS — K759 Inflammatory liver disease, unspecified: Secondary | ICD-10-CM

## 2019-03-12 DIAGNOSIS — C801 Malignant (primary) neoplasm, unspecified: Secondary | ICD-10-CM

## 2019-03-12 DIAGNOSIS — R918 Other nonspecific abnormal finding of lung field: Secondary | ICD-10-CM

## 2019-03-12 DIAGNOSIS — K7581 Nonalcoholic steatohepatitis (NASH): Secondary | ICD-10-CM

## 2019-03-12 DIAGNOSIS — K5790 Diverticulosis of intestine, part unspecified, without perforation or abscess without bleeding: Secondary | ICD-10-CM

## 2019-03-12 DIAGNOSIS — C4402 Squamous cell carcinoma of skin of lip: Secondary | ICD-10-CM

## 2019-03-12 DIAGNOSIS — D899 Disorder involving the immune mechanism, unspecified: Secondary | ICD-10-CM

## 2019-03-12 DIAGNOSIS — K219 Gastro-esophageal reflux disease without esophagitis: Secondary | ICD-10-CM

## 2019-03-12 DIAGNOSIS — C3490 Malignant neoplasm of unspecified part of unspecified bronchus or lung: Secondary | ICD-10-CM

## 2019-03-12 DIAGNOSIS — R911 Solitary pulmonary nodule: Secondary | ICD-10-CM

## 2019-03-12 DIAGNOSIS — J439 Emphysema, unspecified: Secondary | ICD-10-CM

## 2019-03-12 LAB — CBC AND DIFF
Lab: 0 10*3/uL (ref 0–0.20)
Lab: 0.1 10*3/uL (ref 0–0.45)
Lab: 0.2 10*3/uL (ref 0–0.80)
Lab: 0.7 10*3/uL — ABNORMAL LOW (ref 1.0–4.8)
Lab: 1 % (ref >60)
Lab: 12 g/dL — ABNORMAL HIGH (ref 12.0–15.0)
Lab: 13 % (ref 11–15)
Lab: 13 % — ABNORMAL LOW (ref 24–44)
Lab: 2 % (ref 0–5)
Lab: 208 10*3/uL (ref 150–400)
Lab: 3.6 M/UL — ABNORMAL LOW (ref 4.0–5.0)
Lab: 32 pg (ref 26–34)
Lab: 34 % — ABNORMAL LOW (ref 36–45)
Lab: 34 g/dL (ref 32.0–36.0)
Lab: 4.2 10*3/uL (ref >60)
Lab: 5 % (ref 4–12)
Lab: 5.2 10*3/uL (ref 4.5–11.0)
Lab: 79 % — ABNORMAL HIGH (ref 41–77)
Lab: 8.5 FL (ref 7–11)
Lab: 94 FL (ref 80–100)

## 2019-03-12 LAB — PROTIME INR (PT): Lab: 1.1 (ref 0.8–1.2)

## 2019-03-12 LAB — POC CREATININE, RAD: Lab: 1 mg/dL (ref 0.4–1.00)

## 2019-03-12 LAB — ALPHA FETO PROTEIN (AFP): Lab: 2.4 ng/mL (ref 0.0–15.0)

## 2019-03-12 LAB — COMPREHENSIVE METABOLIC PANEL: Lab: 132 MMOL/L — ABNORMAL LOW (ref 137–147)

## 2019-03-12 MED ORDER — SODIUM CHLORIDE 0.9 % IJ SOLN
50 mL | Freq: Once | INTRAVENOUS | 0 refills | Status: CP
Start: 2019-03-12 — End: ?
  Administered 2019-03-12: 17:00:00 50 mL via INTRAVENOUS

## 2019-03-12 MED ORDER — IOHEXOL 350 MG IODINE/ML IV SOLN
100 mL | Freq: Once | INTRAVENOUS | 0 refills | Status: CP
Start: 2019-03-12 — End: ?
  Administered 2019-03-12: 17:00:00 100 mL via INTRAVENOUS

## 2019-04-02 ENCOUNTER — Ambulatory Visit: Admit: 2019-04-02 | Discharge: 2019-04-03 | Payer: MEDICARE

## 2019-04-02 ENCOUNTER — Encounter: Admit: 2019-04-02 | Discharge: 2019-04-02 | Payer: MEDICARE

## 2019-04-02 DIAGNOSIS — J439 Emphysema, unspecified: Secondary | ICD-10-CM

## 2019-04-02 DIAGNOSIS — C4402 Squamous cell carcinoma of skin of lip: Secondary | ICD-10-CM

## 2019-04-02 DIAGNOSIS — K219 Gastro-esophageal reflux disease without esophagitis: Principal | ICD-10-CM

## 2019-04-02 DIAGNOSIS — K5790 Diverticulosis of intestine, part unspecified, without perforation or abscess without bleeding: Secondary | ICD-10-CM

## 2019-04-02 DIAGNOSIS — K7581 Nonalcoholic steatohepatitis (NASH): Secondary | ICD-10-CM

## 2019-04-02 DIAGNOSIS — C801 Malignant (primary) neoplasm, unspecified: Secondary | ICD-10-CM

## 2019-04-02 DIAGNOSIS — K759 Inflammatory liver disease, unspecified: Secondary | ICD-10-CM

## 2019-04-02 DIAGNOSIS — M810 Age-related osteoporosis without current pathological fracture: Secondary | ICD-10-CM

## 2019-04-02 DIAGNOSIS — C499 Malignant neoplasm of connective and soft tissue, unspecified: Secondary | ICD-10-CM

## 2019-04-02 DIAGNOSIS — R911 Solitary pulmonary nodule: Secondary | ICD-10-CM

## 2019-04-02 DIAGNOSIS — C220 Liver cell carcinoma: Secondary | ICD-10-CM

## 2019-04-02 DIAGNOSIS — D899 Disorder involving the immune mechanism, unspecified: Secondary | ICD-10-CM

## 2019-04-02 MED ORDER — OMEPRAZOLE 20 MG PO CPDR
20 mg | ORAL_CAPSULE | Freq: Every day | ORAL | 3 refills | Status: AC
Start: 2019-04-02 — End: ?

## 2019-04-03 ENCOUNTER — Ambulatory Visit: Admit: 2019-04-03 | Discharge: 2019-04-03 | Payer: MEDICARE

## 2019-04-03 DIAGNOSIS — Z0184 Encounter for antibody response examination: Secondary | ICD-10-CM

## 2019-04-03 DIAGNOSIS — K7460 Unspecified cirrhosis of liver: Secondary | ICD-10-CM

## 2019-04-06 ENCOUNTER — Encounter: Admit: 2019-04-06 | Discharge: 2019-04-06 | Payer: MEDICARE

## 2019-05-23 MED ORDER — PROPOFOL 10 MG/ML IV EMUL 20 ML (INFUSION)(AM)(OR)
INTRAVENOUS | 0 refills | Status: DC
Start: 2019-05-23 — End: 2019-05-23
  Administered 2019-05-23: 17:00:00 140 ug/kg/min via INTRAVENOUS

## 2019-05-23 MED ORDER — LACTATED RINGERS IV SOLP
0 refills | Status: DC
Start: 2019-05-23 — End: 2019-05-23
  Administered 2019-05-23: 17:00:00 via INTRAVENOUS

## 2019-05-23 MED ORDER — LIDOCAINE (PF) 200 MG/10 ML (2 %) IJ SYRG
0 refills | Status: DC
Start: 2019-05-23 — End: 2019-05-23
  Administered 2019-05-23: 17:00:00 50 mg via INTRAVENOUS

## 2019-05-23 MED ORDER — PROPOFOL INJ 10 MG/ML IV VIAL
0 refills | Status: DC
Start: 2019-05-23 — End: 2019-05-23
  Administered 2019-05-23: 17:00:00 30 mg via INTRAVENOUS
  Administered 2019-05-23: 17:00:00 50 mg via INTRAVENOUS

## 2019-05-24 ENCOUNTER — Encounter: Admit: 2019-05-24 | Discharge: 2019-05-24 | Payer: MEDICARE

## 2019-05-24 DIAGNOSIS — C220 Liver cell carcinoma: Secondary | ICD-10-CM

## 2019-05-24 DIAGNOSIS — C4402 Squamous cell carcinoma of skin of lip: Secondary | ICD-10-CM

## 2019-05-24 DIAGNOSIS — C499 Malignant neoplasm of connective and soft tissue, unspecified: Secondary | ICD-10-CM

## 2019-05-24 DIAGNOSIS — K219 Gastro-esophageal reflux disease without esophagitis: Secondary | ICD-10-CM

## 2019-05-24 DIAGNOSIS — D899 Disorder involving the immune mechanism, unspecified: Secondary | ICD-10-CM

## 2019-05-24 DIAGNOSIS — K759 Inflammatory liver disease, unspecified: Secondary | ICD-10-CM

## 2019-05-24 DIAGNOSIS — K5790 Diverticulosis of intestine, part unspecified, without perforation or abscess without bleeding: Secondary | ICD-10-CM

## 2019-05-24 DIAGNOSIS — K7581 Nonalcoholic steatohepatitis (NASH): Secondary | ICD-10-CM

## 2019-05-24 DIAGNOSIS — J439 Emphysema, unspecified: Secondary | ICD-10-CM

## 2019-05-24 DIAGNOSIS — R911 Solitary pulmonary nodule: Secondary | ICD-10-CM

## 2019-05-24 DIAGNOSIS — C801 Malignant (primary) neoplasm, unspecified: Secondary | ICD-10-CM

## 2019-06-16 NOTE — Progress Notes
Name: Sarah Pineda          MRN: [id number]      DOB: Jun 03, 1950      AGE: 69 y.o.   DATE OF SERVICE: 06/18/2019    Subjective:             Reason for Visit:  Follow Up      Sarah Pineda is a 69 y.o. female.     Cancer Staging  Hepatocellular carcinoma (HCC)  Staging form: Liver, AJCC 8th Edition  - Clinical: cT1, cN0, cM0 - Signed by Dayton Bailiff, MD on 06/06/2017    Non-small cell lung cancer (HCC)  Staging form: Lung, AJCC 8th Edition  - Pathologic: Stage IA2 (pT1b, pN0, cM0) - Signed by Dayton Bailiff, MD on 08/03/2017  - Pathologic: No stage assigned - Unsigned        History of Present Illness (06/18/2019)    [name] is here for 3 months follow up.  She is doing well, denies any new complaints denies any ascites or encephalopathy.    She had a liver directed therapy in January and had EGD 4 weeks ago.  No new complaints, no nausea, vomiting, abdominal pain. Has 2-3 BM daily.  No recent confusion or asterixis.   ROS is negative.          Review of Systems   Constitutional: Negative for fatigue, fever and unexpected weight change.   HENT: Negative for facial swelling, mouth sores, sore throat, tinnitus, trouble swallowing and voice change.    Respiratory: Negative for cough, choking, chest tightness, shortness of breath and wheezing.    Cardiovascular: Negative for chest pain, palpitations and leg swelling.   Gastrointestinal: Negative for abdominal distention, abdominal pain, anal bleeding, blood in stool, constipation, diarrhea, nausea, rectal pain and vomiting.   Genitourinary: Negative for difficulty urinating, dysuria, frequency and hematuria.   Musculoskeletal: Negative for arthralgias, back pain, gait problem, joint swelling, myalgias and neck pain.   Skin: Negative for pallor, rash and wound.   Neurological: Negative for dizziness, seizures, syncope, speech difficulty, weakness, light-headedness, numbness and headaches.   Hematological: Negative for adenopathy. Does not bruise/bleed easily. Psychiatric/Behavioral: Negative for dysphoric mood and sleep disturbance. The patient is not nervous/anxious.          Objective:         ? aspirin EC 81 mg tablet Take 81 mg by mouth daily after lunch. Take with food.    ? azaTHIOprine (IMURAN) 50 mg tablet Take one tablet by mouth at bedtime daily. Indications: liver inflammation resulting from an abnormal immune response   ? furosemide (LASIX) 20 mg tablet Take one tablet by mouth twice daily.   ? lactulose 10 gram/15 mL oral solution Take 30 mL by mouth twice daily. Two to three times daily (titrates to 2-3 bowel movements daily)   ? omeprazole DR (PRILOSEC) 20 mg capsule Take one capsule by mouth daily before breakfast.   ? oxyCODONE-acetaminophen (PERCOCET) 7.5-325 mg tablet Take 1 tablet by mouth every 12 hours as needed for Pain   ? rifAXIMin (XIFAXAN) 550 mg tablet Take one tablet by mouth every 12 hours.   ? spironolactone (ALDACTONE) 50 mg tablet Take one tablet by mouth daily with breakfast. Take with food.     Vitals:    06/18/19 1251 06/18/19 1252   BP: 126/79    BP Source: Arm, Left Upper    Pulse: 87    Resp: 14    Temp: 36.6 ?C (97.9 ?F)  TempSrc: Oral    SpO2: 96%    Weight: 80.2 kg (176 lb 12.8 oz)    Height: 167.6 cm (66)    PainSc:  Zero     Body mass index is 28.54 kg/m?Marland Kitchen     Pain Score: Zero       Fatigue Scale: 0-None    Pain Addressed:  N/A    Patient Evaluated for a Clinical Trial: No treatment clinical trial available for this patient.     [place] Cooperative Oncology Group performance status is 1, Restricted in physically strenuous activity but ambulatory and able to carry out work of a light or sedentary nature, e.g., light house work, office work.     Physical Exam  Constitutional:       General: She is not in acute distress.     Appearance: Normal appearance. She is well-developed.   HENT:      Head: Normocephalic and atraumatic.   Eyes:      General: No scleral icterus.        Right eye: No discharge.      Pupils: Pupils are equal, round, and reactive to light.   Neck:      Thyroid: No thyromegaly.   Cardiovascular:      Rate and Rhythm: Normal rate and regular rhythm.      Heart sounds: Normal heart sounds. No murmur. No friction rub.   Pulmonary:      Effort: No respiratory distress.      Breath sounds: Normal breath sounds. No wheezing or rales.   Abdominal:      General: Bowel sounds are normal. There is no distension.      Palpations: Abdomen is soft. There is no hepatomegaly, splenomegaly or mass.      Tenderness: There is no abdominal tenderness. There is no guarding or rebound.      Hernia: No hernia is present.   Musculoskeletal:         General: No tenderness.      Cervical back: Full passive range of motion without pain, normal range of motion and neck supple.      Comments: Right lower limb amputation   Lymphadenopathy:      Head:      Right side of head: No submental, submandibular, tonsillar, preauricular, posterior auricular or occipital adenopathy.      Left side of head: No submental, submandibular, tonsillar, preauricular, posterior auricular or occipital adenopathy.      Cervical: No cervical adenopathy.      Upper Body:      Right upper body: No supraclavicular adenopathy.      Left upper body: No supraclavicular adenopathy.   Skin:     General: Skin is warm.      Findings: No petechiae or rash. Rash is not purpuric.   Neurological:      Mental Status: She is alert and oriented to person, place, and time.      Cranial Nerves: No cranial nerve deficit.      Sensory: No sensory deficit.      Coordination: Coordination normal.      Deep Tendon Reflexes: Reflexes are normal and symmetric.   Psychiatric:         Mood and Affect: Mood is not anxious or depressed.         Behavior: Behavior normal.         Thought Content: Thought content normal.         Judgment: Judgment normal.  Assessment and Plan:  Problem List        Oncology    Hepatocellular carcinoma [facility])    Overview       ?  She was admitted to the hospital in 02/2017 with decompensated liver failure. Her MELD at that time was 27. She is currently still on prednisone taper with plans to start azathioprine soon  ?  ?  ?  03/14/17 - AFP = 22.4 and 05/09/17 = 8.3  ?  03/15/17 - MRI ABD impression Redemonstration of 1.7 cm LI RADS 3 observation in segment 5/6 of the liver with arterial enhancement. No appreciable washout or additional significant signal abnormality.  Heterogeneous perfusion pattern throughout the liver without additional discrete mass. Follow-up CT or MRI abdomen (liver protocol) in 3 months recommended. Cirrhosis with small portosystemic varices. No splenomegaly or ascites. ?  ?  03/15/17 Liver biopsy  SURG PATH #: [id number]  A.  Chronic hepatitis with moderate to severe interface and lobular activity with lymphocytic and lympho- plasmacytic infiltrates in the portal tracts and extending into the lobule and areas of confluent hepatocyte necrosis/collapse, compatible with ? ? autoimmune hepatitis. ?   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   See comment.   ?  03/29/17 - Liver biopsy  SURG PATH #: [id number]  A. Liver, mass, biopsy: ? Negative for mass/lesion.  ?  4//8/19 Liver biopsy SURG PATH #: [id number] A. Liver mass, biopsy: Well differentiated hepatocellular carcinoma. ? ?   ?  05/30/17 mri pelvis IMPRESSION ?Three soft tissue nodules within the right pelvis that are unchanged since February 2019. ?These most likely represent metastases from the reported sarcoma. ?Consider biopsy of the largest nodule, as clinically indicated.      ?05/31/17 US Guided bx of the target right gluteal soft tissue nodule  ?Successful ultrasound guided biopsy of the target right gluteal soft tissue mass. ?Four core biopsy specimens submitted to pathology in formalin  ?  05/31/17 PATHOLOGY SURG PATH #: [id number] Diagnosis:  A. Nerve bundles, pelvic lymph node biopsy, core needle biopsy: ? Abundant nerve bundles. See comment.        She is to also have MWA along with TACE for LI-Rads 5 lesion -   08/22/17 Successful CT -guided Microwave Ablation of hepatic segment 5/6 lesion  02/07/19 Successful CT -guided Microwave Ablation of segment 6 liver tumor.               Non-small cell lung cancer (HCC)           Other    Elevated liver enzymes        Autoimmune hepatitis (HCC)        Immunosuppression (HCC)              CT abdomen and pelvis (06/18/2019):    IMPRESSION   1. ?Previously treated hepatic segment 5/6 lesion without residual arterial enhancement or washout. LI-RADS treated nonviable. No new   enhancing hepatic lesion identified.   2. ?Cirrhosis. Normal size spleen. No ascites.   3. ?Punctate nonobstructing renal calculi.            1. Hepatocellular carcinoma biopsy proven.   - Status post her second chemoembolization tolerated that very well.    - We reviewed her labs and tumor marker which are unremarkable. AFP is 2.1.  - Reviewed her CT AP which is negative, except for stable cirrhosis, no ascites.   - Had EGD on 05/23/2019 which showed esophageal varices, small and non-bleeding, so no intervention  needed.    Plan:  - We reviewed her labs and scans, stable. Normal AFP.  - Repeat labs and CT CAP in 4 months  - RTC in 4 months.      2. Abnormal lung nodule of the left upper lobe suspicious for primary neoplasm of the lung .  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  ?We will obtain a CT scan of the chest with contrast next visit.    3. 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     4. Autoimmune hepatitis   - Following up with Dr. Ladona Ridgel well compensated.      Patient seen and discussed with [name].    Hameem Kawsar Fellow, Hem/Onc    Attestation by Dayton Bailiff, MD:  I interviewed and examined this patient with Dr. Manya Silvas and confirmed his history and physical findings.  I have personally reviewed the lab data and x-ray studies.  Together we formulated the assessment and plans outlined below.

## 2019-06-18 ENCOUNTER — Encounter: Admit: 2019-06-18 | Discharge: 2019-06-18 | Payer: MEDICARE

## 2019-06-18 DIAGNOSIS — C4402 Squamous cell carcinoma of skin of lip: Secondary | ICD-10-CM

## 2019-06-18 DIAGNOSIS — C3490 Malignant neoplasm of unspecified part of unspecified bronchus or lung: Secondary | ICD-10-CM

## 2019-06-18 DIAGNOSIS — K219 Gastro-esophageal reflux disease without esophagitis: Secondary | ICD-10-CM

## 2019-06-18 DIAGNOSIS — K5790 Diverticulosis of intestine, part unspecified, without perforation or abscess without bleeding: Secondary | ICD-10-CM

## 2019-06-18 DIAGNOSIS — D899 Disorder involving the immune mechanism, unspecified: Secondary | ICD-10-CM

## 2019-06-18 DIAGNOSIS — C801 Malignant (primary) neoplasm, unspecified: Secondary | ICD-10-CM

## 2019-06-18 DIAGNOSIS — K759 Inflammatory liver disease, unspecified: Secondary | ICD-10-CM

## 2019-06-18 DIAGNOSIS — R911 Solitary pulmonary nodule: Secondary | ICD-10-CM

## 2019-06-18 DIAGNOSIS — D849 Immunodeficiency, unspecified: Secondary | ICD-10-CM

## 2019-06-18 DIAGNOSIS — C499 Malignant neoplasm of connective and soft tissue, unspecified: Secondary | ICD-10-CM

## 2019-06-18 DIAGNOSIS — R748 Abnormal levels of other serum enzymes: Secondary | ICD-10-CM

## 2019-06-18 DIAGNOSIS — C220 Liver cell carcinoma: Secondary | ICD-10-CM

## 2019-06-18 DIAGNOSIS — K754 Autoimmune hepatitis: Secondary | ICD-10-CM

## 2019-06-18 DIAGNOSIS — K7581 Nonalcoholic steatohepatitis (NASH): Secondary | ICD-10-CM

## 2019-06-18 DIAGNOSIS — J439 Emphysema, unspecified: Secondary | ICD-10-CM

## 2019-06-18 LAB — PROTIME INR (PT): Lab: 1.1 MMOL/L (ref 0.8–1.2)

## 2019-06-18 LAB — COMPREHENSIVE METABOLIC PANEL
Lab: 0.5 mg/dL — ABNORMAL HIGH (ref 0.3–1.2)
Lab: 0.8 mg/dL (ref 0.4–1.00)
Lab: 105 mg/dL — ABNORMAL HIGH (ref 70–100)
Lab: 109 U/L (ref 25–110)
Lab: 12 mg/dL (ref 7–25)
Lab: 135 MMOL/L — ABNORMAL LOW (ref 137–147)
Lab: 17 U/L (ref 7–40)
Lab: 31 MMOL/L — ABNORMAL HIGH (ref 21–30)
Lab: 4.3 g/dL — ABNORMAL LOW (ref 3.5–5.0)
Lab: 4.5 MMOL/L (ref 3.5–5.1)
Lab: 5 10*3/uL — ABNORMAL LOW (ref 3–12)
Lab: 7.6 g/dL (ref 6.0–8.0)
Lab: 8 U/L (ref 7–56)
Lab: 9.7 mg/dL (ref 8.5–10.6)
Lab: >60 mL/min (ref >60)
Lab: >60 mL/min (ref >60)

## 2019-06-18 LAB — ALPHA FETO PROTEIN (AFP): Lab: 2.1 ng/mL (ref 0.0–15.0)

## 2019-06-18 LAB — CBC AND DIFF
Lab: 0 10*3/uL (ref 0–0.20)
Lab: 3.9 M/UL — ABNORMAL LOW (ref 4.0–5.0)
Lab: 5.7 10*3/uL (ref 4.5–11.0)

## 2019-06-18 LAB — POC CREATININE, RAD: Lab: 0.9 mg/dL — AB (ref 0.4–1.00)

## 2019-06-18 MED ORDER — IOHEXOL 350 MG IODINE/ML IV SOLN
100 mL | Freq: Once | INTRAVENOUS | 0 refills | Status: CP
Start: 2019-06-18 — End: ?
  Administered 2019-06-18: 14:00:00 100 mL via INTRAVENOUS

## 2019-06-18 MED ORDER — SODIUM CHLORIDE 0.9 % IJ SOLN
50 mL | Freq: Once | INTRAVENOUS | 0 refills | Status: CP
Start: 2019-06-18 — End: ?
  Administered 2019-06-18: 14:00:00 50 mL via INTRAVENOUS

## 2019-06-18 NOTE — Patient Instructions
Dr.Raed Al-Rajabi, MD Medical Oncologist specializing in Gastro-Intestinal malignancies   Molly Silver, APRN Nurse Practitioner   Lauren Barker, RN Yesennia Hirota, RN    Nurse Phone: [phone]-Monday-Friday 8:00am- 4:00pm   After Hours Phone: [phone]- On call number for urgent needs outside of business hours ask for the On call Oncology fellow to be paged and they will call you back  Scheduling: [phone]- call with any scheduling or appointment questions   Medication refills: please contact your pharmacy for medication refills, if they do not have any refills on file they will contact the office, make sure they have our correct contact information on file P: [phone], F: [phone]  MyChart Messages: All mychart messages are answered by the nurses, even if you select to send the message to Dr. Al-Rajabi or Molly. We often communicate with Dr. Al-Rajabi or Molly regarding how to answer these messages. Messages are answered 8:00am-4:00pm Monday-Friday.  Phone Calls: We are typically not at our desks where our phones are located as we are in clinic. Please leave us a message as we check our messages several times per day. It is our goal to answer these messages as soon as possible. Messages are answered from 8:00am-4:00 pm Monday-Friday. Please make sure you leave your full name with the spelling, date of birth, and reason for your call when leaving a message.

## 2019-07-04 ENCOUNTER — Encounter: Admit: 2019-07-04 | Discharge: 2019-07-04 | Payer: MEDICARE

## 2019-07-07 ENCOUNTER — Encounter: Admit: 2019-07-07 | Discharge: 2019-07-07 | Payer: MEDICARE

## 2019-07-07 MED ORDER — RIFAXIMIN 550 MG PO TAB
550 mg | ORAL_TABLET | Freq: Two times a day (BID) | ORAL | 11 refills | 30.00000 days | Status: AC
Start: 2019-07-07 — End: ?

## 2019-08-11 ENCOUNTER — Encounter: Admit: 2019-08-11 | Discharge: 2019-08-11 | Payer: MEDICARE

## 2019-08-11 MED ORDER — SPIRONOLACTONE 50 MG PO TAB
50 mg | ORAL_TABLET | Freq: Every day | ORAL | 5 refills | 46.00000 days | Status: AC
Start: 2019-08-11 — End: ?

## 2019-09-02 ENCOUNTER — Encounter: Admit: 2019-09-02 | Discharge: 2019-09-02 | Payer: MEDICARE

## 2019-09-02 MED ORDER — CONSTULOSE 10 GRAM/15 ML PO SOLN
Freq: Three times a day (TID) | ORAL | 11 refills | 21.00000 days | Status: AC
Start: 2019-09-02 — End: ?

## 2019-09-10 ENCOUNTER — Encounter: Admit: 2019-09-10 | Discharge: 2019-09-10 | Payer: MEDICARE

## 2019-09-10 NOTE — Telephone Encounter
Spoke to patient's daughter and scheduled OV w/ JT.

## 2019-09-15 ENCOUNTER — Encounter: Admit: 2019-09-15 | Discharge: 2019-09-15 | Payer: MEDICARE

## 2019-09-18 ENCOUNTER — Encounter: Admit: 2019-09-18 | Discharge: 2019-09-18 | Payer: MEDICARE

## 2019-09-18 NOTE — Telephone Encounter
Pt's daughter returned your call

## 2019-10-22 ENCOUNTER — Encounter: Admit: 2019-10-22 | Discharge: 2019-10-22 | Payer: MEDICARE

## 2019-10-22 DIAGNOSIS — K219 Gastro-esophageal reflux disease without esophagitis: Secondary | ICD-10-CM

## 2019-10-22 DIAGNOSIS — J439 Emphysema, unspecified: Secondary | ICD-10-CM

## 2019-10-22 DIAGNOSIS — K7581 Nonalcoholic steatohepatitis (NASH): Secondary | ICD-10-CM

## 2019-10-22 DIAGNOSIS — C499 Malignant neoplasm of connective and soft tissue, unspecified: Secondary | ICD-10-CM

## 2019-10-22 DIAGNOSIS — R911 Solitary pulmonary nodule: Secondary | ICD-10-CM

## 2019-10-22 DIAGNOSIS — C220 Liver cell carcinoma: Secondary | ICD-10-CM

## 2019-10-22 DIAGNOSIS — C3490 Malignant neoplasm of unspecified part of unspecified bronchus or lung: Secondary | ICD-10-CM

## 2019-10-22 DIAGNOSIS — D899 Disorder involving the immune mechanism, unspecified: Secondary | ICD-10-CM

## 2019-10-22 DIAGNOSIS — K759 Inflammatory liver disease, unspecified: Secondary | ICD-10-CM

## 2019-10-22 DIAGNOSIS — C3412 Malignant neoplasm of upper lobe, left bronchus or lung: Secondary | ICD-10-CM

## 2019-10-22 DIAGNOSIS — C4402 Squamous cell carcinoma of skin of lip: Secondary | ICD-10-CM

## 2019-10-22 DIAGNOSIS — C801 Malignant (primary) neoplasm, unspecified: Secondary | ICD-10-CM

## 2019-10-22 DIAGNOSIS — K5790 Diverticulosis of intestine, part unspecified, without perforation or abscess without bleeding: Secondary | ICD-10-CM

## 2019-10-22 LAB — COMPREHENSIVE METABOLIC PANEL
Lab: 0.4 mg/dL — ABNORMAL HIGH (ref 0.3–1.2)
Lab: 0.8 mg/dL (ref 0.4–1.00)
Lab: 102 mg/dL — ABNORMAL HIGH (ref 70–100)
Lab: 114 U/L — ABNORMAL HIGH (ref 25–110)
Lab: 13 mg/dL (ref 7–25)
Lab: 134 MMOL/L — ABNORMAL LOW (ref 137–147)
Lab: 17 U/L (ref 7–40)
Lab: 29 MMOL/L (ref 21–30)
Lab: 4 g/dL — ABNORMAL LOW (ref 3.5–5.0)
Lab: 4.1 MMOL/L (ref 3.5–5.1)
Lab: 6 10*3/uL — ABNORMAL LOW (ref 3–12)
Lab: 7.3 g/dL (ref 6.0–8.0)
Lab: 8 U/L (ref 7–56)
Lab: 9.5 mg/dL (ref 8.5–10.6)
Lab: >60 mL/min (ref >60)
Lab: >60 mL/min (ref >60)

## 2019-10-22 LAB — CBC AND DIFF
Lab: 0.1 K/UL (ref 0–0.20)
Lab: 3.9 M/UL — ABNORMAL LOW (ref 4.0–5.0)
Lab: 6.1 10*3/uL (ref 4.5–11.0)

## 2019-10-22 LAB — PROTIME INR (PT): Lab: 1.1 MMOL/L (ref 0.8–1.2)

## 2019-10-22 LAB — POC CREATININE, RAD: Lab: 0.9 mg/dL (ref 0.4–1.00)

## 2019-10-22 LAB — ALPHA FETO PROTEIN (AFP): Lab: 1.7 ng/mL (ref 0.0–15.0)

## 2019-10-22 MED ORDER — IOHEXOL 350 MG IODINE/ML IV SOLN
100 mL | Freq: Once | INTRAVENOUS | 0 refills | Status: CP
Start: 2019-10-22 — End: ?
  Administered 2019-10-22: 14:00:00 100 mL via INTRAVENOUS

## 2019-10-22 MED ORDER — SODIUM CHLORIDE 0.9 % IJ SOLN
50 mL | Freq: Once | INTRAVENOUS | 0 refills | Status: CP
Start: 2019-10-22 — End: ?
  Administered 2019-10-22: 14:00:00 50 mL via INTRAVENOUS

## 2019-10-22 NOTE — Progress Notes
Name: Sarah Pineda          MRN: [id number]      DOB: 12/26/1950      AGE: 69 y.o.   DATE OF SERVICE: 10/22/2019    Subjective:             Reason for Visit:  Cancer      Sarah Pineda is a 69 y.o. female.     Cancer Staging  Hepatocellular carcinoma (HCC)  Staging form: Liver, AJCC 8th Edition  - Clinical: cT1, cN0, cM0 - Signed by Dayton Bailiff, MD on 06/06/2017    Non-small cell lung cancer (HCC)  Staging form: Lung, AJCC 8th Edition  - Pathologic: Stage IA2 (pT1b, pN0, cM0) - Signed by Dayton Bailiff, MD on 08/03/2017  - Pathologic: No stage assigned - Unsigned        History of Present Illness     Sarah Pineda doing really well denies any new complaints denies any new lumps or bumps under the skin.  Denied any nausea or vomiting.  No ascites or encephalopathy.  Denies any cough, hemoptysis or shortness of breath.  Has good energy able to care for ADLs and IADLs.           Review of Systems   Constitutional: Negative for fatigue, fever and unexpected weight change.   HENT: Negative for facial swelling, mouth sores, sore throat, tinnitus, trouble swallowing and voice change.    Respiratory: Negative for cough, choking, chest tightness, shortness of breath and wheezing.    Cardiovascular: Negative for chest pain, palpitations and leg swelling.   Gastrointestinal: Negative for abdominal distention, abdominal pain, anal bleeding, blood in stool, constipation, diarrhea, nausea, rectal pain and vomiting.   Genitourinary: Negative for difficulty urinating, dysuria, frequency and hematuria.   Musculoskeletal: Negative for arthralgias, back pain, gait problem, joint swelling, myalgias and neck pain.   Skin: Negative for pallor, rash and wound.   Neurological: Negative for dizziness, seizures, syncope, speech difficulty, weakness, light-headedness, numbness and headaches.   Hematological: Negative for adenopathy. Does not bruise/bleed easily.   Psychiatric/Behavioral: Negative for dysphoric mood and sleep disturbance. The patient is not nervous/anxious.          Objective:         ? aspirin EC 81 mg tablet Take 81 mg by mouth daily after lunch. Take with food.    ? azaTHIOprine (IMURAN) 50 mg tablet Take one tablet by mouth at bedtime daily. Indications: liver inflammation resulting from an abnormal immune response   ? CONSTULOSE 10 gram/15 mL oral solution TAKE BY MOUTH TWO TO THREE TIMES DAILY (TITRATES TO 2-3 BOWEL MOVEMENTS DAILY)   ? furosemide (LASIX) 20 mg tablet Take one tablet by mouth twice daily.   ? omeprazole DR (PRILOSEC) 20 mg capsule Take one capsule by mouth daily before breakfast.   ? oxyCODONE-acetaminophen (PERCOCET) 7.5-325 mg tablet Take 1 tablet by mouth every 12 hours as needed for Pain   ? rifAXIMin (XIFAXAN) 550 mg tablet Take one tablet by mouth every 12 hours.   ? spironolactone (ALDACTONE) 50 mg tablet Take one tablet by mouth daily with breakfast. Take with food.     Vitals:    10/22/19 1107 10/22/19 1109   BP:  131/84   BP Source:  Arm, Right Upper   Pulse:  83   Resp:  16   Temp:  36.9 ?C (98.5 ?F)   TempSrc:  Oral   SpO2:  97%   Weight:  78.5 kg (  173 lb)   Height:  167.6 cm (66)   PainSc: Zero      Body mass index is 27.92 kg/m?Marland Kitchen     Pain Score: Zero       Fatigue Scale: 0-None    Pain Addressed:  N/A    Patient Evaluated for a Clinical Trial: No treatment clinical trial available for this patient.     [place] Cooperative Oncology Group performance status is 1, Restricted in physically strenuous activity but ambulatory and able to carry out work of a light or sedentary nature, e.g., light house work, office work.     Physical Exam  Constitutional:       General: She is not in acute distress.     Appearance: Normal appearance. She is well-developed.   HENT:      Head: Normocephalic and atraumatic.   Eyes:      General: No scleral icterus.        Right eye: No discharge.      Pupils: Pupils are equal, round, and reactive to light.   Neck:      Thyroid: No thyromegaly.   Cardiovascular:      Rate and Rhythm: Normal rate and regular rhythm.      Heart sounds: Normal heart sounds. No murmur heard.   No friction rub.   Pulmonary:      Effort: No respiratory distress.      Breath sounds: Normal breath sounds. No wheezing or rales.   Abdominal:      General: Bowel sounds are normal. There is no distension.      Palpations: Abdomen is soft. There is no hepatomegaly, splenomegaly or mass.      Tenderness: There is no abdominal tenderness. There is no guarding or rebound.      Hernia: No hernia is present.   Musculoskeletal:         General: No tenderness.      Cervical back: Full passive range of motion without pain, normal range of motion and neck supple.      Comments: Right lower limb amputation   Lymphadenopathy:      Head:      Right side of head: No submental, submandibular, tonsillar, preauricular, posterior auricular or occipital adenopathy.      Left side of head: No submental, submandibular, tonsillar, preauricular, posterior auricular or occipital adenopathy.      Cervical: No cervical adenopathy.      Upper Body:      Right upper body: No supraclavicular adenopathy.      Left upper body: No supraclavicular adenopathy.   Skin:     General: Skin is warm.      Findings: No petechiae or rash. Rash is not purpuric.   Neurological:      Mental Status: She is alert and oriented to person, place, and time.      Cranial Nerves: No cranial nerve deficit.      Sensory: No sensory deficit.      Coordination: Coordination normal.      Deep Tendon Reflexes: Reflexes are normal and symmetric.   Psychiatric:         Mood and Affect: Mood is not anxious or depressed.         Behavior: Behavior normal.         Thought Content: Thought content normal.         Judgment: Judgment normal.  Assessment and Plan:  Problem List        Oncology    Hepatocellular carcinoma [facility])    Overview       ?  She was admitted to the hospital in 02/2017 with decompensated liver failure. Her MELD at that time was 27. She is currently still on prednisone taper with plans to start azathioprine soon  ?  ?  ?  03/14/17 - AFP = 22.4 and 05/09/17 = 8.3  ?  03/15/17 - MRI ABD impression Redemonstration of 1.7 cm LI RADS 3 observation in segment 5/6 of the liver with arterial enhancement. No appreciable washout or additional significant signal abnormality.  Heterogeneous perfusion pattern throughout the liver without additional discrete mass. Follow-up CT or MRI abdomen (liver protocol) in 3 months recommended. Cirrhosis with small portosystemic varices. No splenomegaly or ascites. ?  ?  03/15/17 Liver biopsy  SURG PATH #: [id number]  A.  Chronic hepatitis with moderate to severe interface and lobular activity with lymphocytic and lympho- plasmacytic infiltrates in the portal tracts and extending into the lobule and areas of confluent hepatocyte necrosis/collapse, compatible with ? ? autoimmune hepatitis. ?   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   See comment.   ?  03/29/17 - Liver biopsy  SURG PATH #: [id number]  A. Liver, mass, biopsy: ? Negative for mass/lesion.  ?  4//8/19 Liver biopsy SURG PATH #: [id number] A. Liver mass, biopsy: Well differentiated hepatocellular carcinoma. ? ?   ?  05/30/17 mri pelvis IMPRESSION ?Three soft tissue nodules within the right pelvis that are unchanged since February 2019. ?These most likely represent metastases from the reported sarcoma. ?Consider biopsy of the largest nodule, as clinically indicated.      ?05/31/17 US Guided bx of the target right gluteal soft tissue nodule  ?Successful ultrasound guided biopsy of the target right gluteal soft tissue mass. ?Four core biopsy specimens submitted to pathology in formalin  ?  05/31/17 PATHOLOGY SURG PATH #: [id number] Diagnosis:  A. Nerve bundles, pelvic lymph node biopsy, core needle biopsy: ? Abundant nerve bundles. See comment.        She is to also have MWA along with TACE for LI-Rads 5 lesion -   08/22/17 Successful CT -guided Microwave Ablation of hepatic segment 5/6 lesion  02/07/19 Successful CT -guided Microwave Ablation of segment 6 liver tumor.               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 Lung, left upper lobe wedge mass, wedge resection: ?   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 ?Previously treated hepatic segment 5/6 lesion without residual arterial enhancement or washout. LI-RADS treated nonviable. No new   enhancing hepatic lesion identified.   2. ?Cirrhosis. Normal size spleen. No ascites.   3. ?Punctate nonobstructing renal calculi.            1. Hepatocellular carcinoma biopsy proven.   - Status post her second chemoembolization tolerated that very well.    - We reviewed her labs and tumor marker which are unremarkable. AFP is 2.1.  - Reviewed her CT AP which  is negative, except for stable cirrhosis, no ascites.   - Had EGD on 05/23/2019 which showed esophageal varices, small and non-bleeding, so no intervention needed.    Dennie Bible is here for restaging scans.  Her scans show no evidence of recurrent disease in the liver liver function is adequately maintained and tumor markers are normal.  Plan:  - We reviewed her labs and scans, stable. Normal AFP.  - RTC in 3 months.      2. Abnormal lung nodule of the left upper lobe suspicious for primary neoplasm of the lung .  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  ?We discussed her 11 mm new pulmonary nodule this is concerning for either recurrence of her disease or new primary.  I would like to get a PET CT scan to further evaluate her thoracic lymph node status I will refer her back to Dr. Derenda Fennel.  Once we have a PET scan we will plan on the best way to proceed she may need a biopsy prior to any intervention.    3. 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     4. Autoimmune hepatitis   - Following up with Dr. Ladona Ridgel well compensated.    Follow-up in 3 months possibly sooner if she goes for surgery.  Marland Kitchen

## 2019-10-22 NOTE — Patient Instructions
Dr.Raed Al-Rajabi, MD Medical Oncologist specializing in Gastro-Intestinal malignancies   Molly Silver, APRN Nurse Practitioner   Lauren Barker, RN Dawit Tankard, RN    Nurse Phone: [phone]-Monday-Friday 8:00am- 4:00pm   After Hours Phone: [phone]- On call number for urgent needs outside of business hours ask for the On call Oncology fellow to be paged and they will call you back  Scheduling: [phone]- call with any scheduling or appointment questions   Medication refills: please contact your pharmacy for medication refills, if they do not have any refills on file they will contact the office, make sure they have our correct contact information on file P: [phone], F: [phone]  MyChart Messages: All mychart messages are answered by the nurses, even if you select to send the message to Dr. Al-Rajabi or Molly. We often communicate with Dr. Al-Rajabi or Molly regarding how to answer these messages. Messages are answered 8:00am-4:00pm Monday-Friday.  Phone Calls: We are typically not at our desks where our phones are located as we are in clinic. Please leave us a message as we check our messages several times per day. It is our goal to answer these messages as soon as possible. Messages are answered from 8:00am-4:00 pm Monday-Friday. Please make sure you leave your full name with the spelling, date of birth, and reason for your call when leaving a message.

## 2019-10-23 ENCOUNTER — Encounter: Admit: 2019-10-23 | Discharge: 2019-10-23 | Payer: MEDICARE

## 2019-10-24 ENCOUNTER — Encounter: Admit: 2019-10-24 | Discharge: 2019-10-24 | Payer: MEDICARE

## 2019-10-24 DIAGNOSIS — R911 Solitary pulmonary nodule: Secondary | ICD-10-CM

## 2019-10-26 ENCOUNTER — Encounter: Admit: 2019-10-26 | Discharge: 2019-10-26 | Payer: MEDICARE

## 2019-10-26 MED ORDER — AZATHIOPRINE 50 MG PO TAB
ORAL_TABLET | Freq: Every evening | 0 refills
Start: 2019-10-26 — End: ?

## 2019-10-29 ENCOUNTER — Encounter: Admit: 2019-10-29 | Discharge: 2019-10-29 | Payer: MEDICARE

## 2019-10-29 ENCOUNTER — Ambulatory Visit: Admit: 2019-10-29 | Discharge: 2019-10-29 | Payer: MEDICARE

## 2019-10-29 DIAGNOSIS — C220 Liver cell carcinoma: Secondary | ICD-10-CM

## 2019-10-29 DIAGNOSIS — C3412 Malignant neoplasm of upper lobe, left bronchus or lung: Secondary | ICD-10-CM

## 2019-10-29 LAB — POC GLUCOSE: Lab: 98 mg/dL (ref 70–100)

## 2019-10-29 MED ORDER — RP DX F-18 FDG MCI
10 | Freq: Once | INTRAVENOUS | 0 refills | Status: CP
Start: 2019-10-29 — End: ?
  Administered 2019-10-29: 18:00:00 12 via INTRAVENOUS

## 2019-11-04 ENCOUNTER — Encounter: Admit: 2019-11-04 | Discharge: 2019-11-04 | Payer: MEDICARE

## 2019-11-04 DIAGNOSIS — R911 Solitary pulmonary nodule: Secondary | ICD-10-CM

## 2019-11-04 DIAGNOSIS — C3490 Malignant neoplasm of unspecified part of unspecified bronchus or lung: Secondary | ICD-10-CM

## 2019-11-04 NOTE — Telephone Encounter
Called Sarah Pineda, her daughter Sarah Pineda answered. Informed Sarah Pineda that Sarah Pineda's PET scan showed a new lung nodule, so Dr Tamsen Meek has referred her to cardiothoracic surgery and to thoracic oncology. Sarah Pineda mentioned that nobody has reached out to them yet about the PET scan results, and they would like Dr Tamsen Meek to call Sarah Pineda at her home number to discuss this with her. Informed Sarah Pineda that I'll ask Dr Tamsen Meek to see if he can reach out. Sarah Pineda v/u

## 2019-11-10 ENCOUNTER — Encounter: Admit: 2019-11-10 | Discharge: 2019-11-10 | Payer: MEDICARE

## 2019-11-10 ENCOUNTER — Ambulatory Visit: Admit: 2019-11-10 | Discharge: 2019-11-11 | Payer: MEDICARE

## 2019-11-10 DIAGNOSIS — C499 Malignant neoplasm of connective and soft tissue, unspecified: Secondary | ICD-10-CM

## 2019-11-10 DIAGNOSIS — C220 Liver cell carcinoma: Secondary | ICD-10-CM

## 2019-11-10 DIAGNOSIS — K219 Gastro-esophageal reflux disease without esophagitis: Secondary | ICD-10-CM

## 2019-11-10 DIAGNOSIS — K7469 Other cirrhosis of liver: Secondary | ICD-10-CM

## 2019-11-10 DIAGNOSIS — K7290 Hepatic failure, unspecified without coma: Secondary | ICD-10-CM

## 2019-11-10 DIAGNOSIS — J439 Emphysema, unspecified: Secondary | ICD-10-CM

## 2019-11-10 DIAGNOSIS — K5790 Diverticulosis of intestine, part unspecified, without perforation or abscess without bleeding: Secondary | ICD-10-CM

## 2019-11-10 DIAGNOSIS — C801 Malignant (primary) neoplasm, unspecified: Secondary | ICD-10-CM

## 2019-11-10 DIAGNOSIS — K7581 Nonalcoholic steatohepatitis (NASH): Secondary | ICD-10-CM

## 2019-11-10 DIAGNOSIS — R911 Solitary pulmonary nodule: Secondary | ICD-10-CM

## 2019-11-10 DIAGNOSIS — K754 Autoimmune hepatitis: Secondary | ICD-10-CM

## 2019-11-10 DIAGNOSIS — R188 Other ascites: Secondary | ICD-10-CM

## 2019-11-10 DIAGNOSIS — C4402 Squamous cell carcinoma of skin of lip: Secondary | ICD-10-CM

## 2019-11-10 DIAGNOSIS — D899 Disorder involving the immune mechanism, unspecified: Secondary | ICD-10-CM

## 2019-11-10 DIAGNOSIS — K759 Inflammatory liver disease, unspecified: Secondary | ICD-10-CM

## 2019-11-10 MED ORDER — FUROSEMIDE 20 MG PO TAB
20 mg | ORAL_TABLET | Freq: Every morning | ORAL | 3 refills | 90.00000 days | Status: AC
Start: 2019-11-10 — End: ?

## 2019-11-10 NOTE — Progress Notes
Date of Service: 11/10/2019     Sarah Pineda is a 69 y.o. female    Subjective:       History of Present Illness  Ms. Sarah Pineda is a very pleasant 69 year old female with a history of biopsy-proven autoimmune hepatitis with concerns for early cirrhosis on previous liver biopsy at the time of initial presentation in February 2019 where she presented with abnormal liver tests and jaundice.  She has had evidence of mild portal hypertension.  She has been found to have a liver lesion measuring 1.7 cm with biopsy showing hepatocellular carcinoma in April 2019.  She was treated with initial microwave ablation and TACE of June of 2019 and follow up on August 22, 2017 and microwave ablation segment 5/6 [facility] recurrence January 2021.  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 Patient presents to clinic today daughter, last seen March 2021 by Dr. Earle Gell.     Interim update:  Overall the patient has been well and she denies hospitalizations or ED visits.  Most recent MELD score 7 from labs September 2021.  She has she has continued on azathioprine 50 mg daily and denies side effects or missed doses.  She has continued on Xifaxan twice daily and Constulose once daily with an average of 2 bowel movements per day.  She reports she had trialed off of diuretics but noted lower extremity edema and restarted them with spironolactone 50 mg daily and furosemide 20 mg daily with resolution of edema.  Most recent lab testing September 2021 showed liver enzymes near normal with AST 17, ALT 8, alkaline phosphatase 114. Kidney function has remained normal with creatinine 0.9.  Unfortunately patient had imaging with CT chest/abdomen September 2021 which showed segment 5/6 LI RADS treated nonviable liver lesion, new 11 mm pulmonary nodule in the left upper lobe.  This resulted in PET scan in October 2021 with concern for recurrent tumor or pulmonary metastasis.  She has upcoming office visit scheduled with thoracic oncology and cardiothoracic surgery for evaluation.  She additionally has office visit with endocrinology to review osteoporosis.  She has seen dermatology within the past year and treated with liquid nitrogen and topical treatment for precancerous lesions on her nose. Patient denies abdominal pain, nausea, hematemesis, diarrhea, constipation, melena, hematochezia, fatigue, fevers, jaundice, pruritis, chest pain, shortness of breath, edema, or confusion. Patient denies tobacco, nicotine, alcohol, or substance use including MJ. No other changes or new symptoms.      Patient denies abdominal pain, nausea, hematemesis, diarrhea, constipation, melena, hematochezia, fatigue, fevers, jaundice, pruritis, chest pain, shortness of breath, edema, or confusion. Patient denies tobacco, nicotine, alcohol, or substance use including MJ. No other changes or new symptoms.         Review of Systems  Review of systems as above and per History of Present Illness; otherwise negative for 10 of 14 systems reviewed.    Objective:         ? aspirin EC 81 mg tablet Take 81 mg by mouth daily after lunch. Take with food.    ?  azaTHIOprine (IMURAN) 50 mg tablet TAKE 1 TABLET BY MOUTH AT BEDTIME   ? CONSTULOSE 10 gram/15 mL oral solution TAKE BY MOUTH TWO TO THREE TIMES DAILY (TITRATES TO 2-3 BOWEL MOVEMENTS DAILY)   ? furosemide (LASIX) 20 mg tablet Take one tablet by mouth twice daily.   ? omeprazole DR (PRILOSEC) 20 mg capsule Take one capsule by mouth daily before breakfast.   ? oxyCODONE-acetaminophen (PERCOCET) 7.5-325 mg tablet Take 1 tablet by mouth every 12 hours as needed for Pain   ? rifAXIMin (XIFAXAN) 550 mg tablet Take one tablet by mouth every 12 hours.   ? spironolactone (ALDACTONE) 50 mg tablet Take one tablet by mouth daily with breakfast. Take with food.     There were no vitals filed for this visit.  There is no height or weight on file to calculate BMI.     Physical Exam  In general, she is awake, alert, and oriented x3, in no apparent distress. Her sclerae were anicteric. Chest was clear to auscultation bilaterally.  Cardiovascular:  Regular rate and rhythm.  Abdomen: Soft, nontender, and nondistended with positive bowel sounds.  No focal areas of tenderness, rebound, or guarding.  No overt organomegaly.  No appreciable ascites present.  Extremities revealed no cyanosis or clubbing of her left lower extremity.  She is status post amputation of right lower extremity.  Trace left lower extremity edema.  She presents today with crutches and wheelchair.  Her neurologic exam was nonfocal.  She was pleasant and interactive.  No evidence of asterixis today.  Skin exam showed no jaundice.  There were a few small scattered spider telangiectasias noted.    MELD-Na score: 7 at 10/22/2019  8:20 AM  MELD score: 7 at 10/22/2019  8:20 AM  Calculated from:  Serum Creatinine: 0.9 MG/DL (Using min of 1 MG/DL) at 1/61/0960  4:54 AM  Serum Sodium: 134 MMOL/L at 10/22/2019  8:17 AM  Total Bilirubin: 0.4 MG/DL (Using min of 1 MG/DL) at 0/98/1191  4:78 AM  INR(ratio): 1.1 at 10/22/2019  8:17 AM  Age: 62 years    Hospital Outpatient Visit on 10/29/2019   Component Date Value Ref Range Status   ? Glucose, POC 10/29/2019 98  70 - 100 MG/DL Final   Results for Sarah Pineda, Sarah Pineda (MRN [id number]) as of 11/10/2019 15:42   Ref. Range 10/22/2019 08:17   Hemoglobin Latest Ref Range: 12.0 - 15.0 GM/DL 08.6   Hematocrit Latest Ref Range: 36 - 45 % 36.3   Platelet Count Latest Ref Range: 150 - 400 K/UL 215   White Blood Cells Latest Ref Range: 4.5 - 11.0 K/UL 6.1   Neutrophils Latest Ref Range: 41 - 77 % 84 (H)   Absolute Neutrophil Count Latest Ref Range: 1.8 - 7.0 K/UL 5.10   Lymphocytes Latest Ref Range: 24 - 44 % 7 (L)   Absolute Lymph Count Latest Ref Range: 1.0 - 4.8 K/UL 0.40 (L)   Monocytes Latest Ref Range: 4 - 12 % 6   Absolute Monocyte Count Latest Ref Range: 0 - 0.80 K/UL 0.40   Eosinophils Latest Ref Range: 0 - 5 % 2   Absolute Eosinophil Count Latest Ref Range: 0 - 0.45 K/UL 0.10   Absolute Basophil Count Latest Ref Range: 0 - 0.20 K/UL 0.10   Basophils Latest Ref Range: 0 - 2 % 1   RBC Latest Ref Range: 4.0 - 5.0 M/UL 3.94 (L)   MCV Latest Ref Range: 80 - 100 FL 92.0  MCH Latest Ref Range: 26 - 34 PG 31.7   MCHC Latest Ref Range: 32.0 - 36.0 G/DL 72.5   MPV Latest Ref Range: 7 - 11 FL 8.6   RDW Latest Ref Range: 11 - 15 % 14.3   INR Latest Ref Range: 0.8 - 1.2  1.1   Sodium Latest Ref Range: 137 - 147 MMOL/L 134 (L)   Potassium Latest Ref Range: 3.5 - 5.1 MMOL/L 4.1   Chloride Latest Ref Range: 98 - 110 MMOL/L 99   CO2 Latest Ref Range: 21 - 30 MMOL/L 29   Anion Gap Latest Ref Range: 3 - 12  6   Blood Urea Nitrogen Latest Ref Range: 7 - 25 MG/DL 13   Creatinine Latest Ref Range: 0.4 - 1.00 MG/DL 3.66   eGFR Non African American Latest Ref Range: >60 mL/min >60   eGFR African American Latest Ref Range: >60 mL/min >60   Glucose Latest Ref Range: 70 - 100 MG/DL 440 (H)   Albumin Latest Ref Range: 3.5 - 5.0 G/DL 4.0   Calcium Latest Ref Range: 8.5 - 10.6 MG/DL 9.5   Total Bilirubin Latest Ref Range: 0.3 - 1.2 MG/DL 0.4   Total Protein Latest Ref Range: 6.0 - 8.0 G/DL 7.3   AST (SGOT) Latest Ref Range: 7 - 40 U/L 17   ALT (SGPT) Latest Ref Range: 7 - 56 U/L 8   Alk Phosphatase Latest Ref Range: 25 - 110 U/L 114 (H)   Alpha Feto Protein Latest Ref Range: 0.0 - 15.0 NG/ML 1.7   PET SCAN NECK, CHEST, ABDOMEN, AND PELVIS:     Clinical History:69 years old Female ?HCC and NSCL patient whose scans are   inconclusive on a chest nodule. Pls do PET scan to ruleout lung cancer. TECHNIQUE:     The patient's blood glucose was 98mg /dl just prior to time of injection. ?   of 18-Flourine FDG was administered intravenously to the patient. ?   After an appropriate delay of 86 minutes to allow for uptake, CT imaging   for attenuation correction and anatomic localization and PET imaging were   obtained from the skull vertex through the mid thighs. PET images were   reviewed in standard orthogonal projections. ?Non-Contrast CT imaging was   performed for attenuation correction and localization purposes only. These   images do not constitute a diagnostic quality CT examination.       Comparison: Prior PET/CT from 06/08/2017. Correlation is also made with CT   chest, abdomen/pelvis of 10/22/2019.     FINDINGS:     Current: ? blood pool: mean SUV: 1.8; max SUV: 2.3 ?Hepatic mean SUV: 3;   max SUV:4   Previous: blood pool: mean SUV: 2.3; max SUV: 2.6 ?Hepatic mean SUV: 3.2;   max SUV:4.2     Physiologic uptake of F-18 FDG is seen within the visualized portions of   the brain, heart, kidneys, bladder and bowel.     Head/neck: No suspicious hypermetabolic lesion in this region.     Chest: Postsurgical changes of prior left upper lobe wedge resection of   previously noted hypermetabolic lesion from prior PET/CT of 06/08/2017.   There is increase FDG uptake in the area of soft tissue thickening   adjacent to suture materials at the site of left upper lobe wedge   resection which demonstrate maximum SUV of 28 (index 301). The new   pulmonary nodule in the left upper lobe noted on CT of 10/22/2019   demonstrate  increase FDG uptake with maximum SUV of 9.5 (index 324). No   hypermetabolic nodes are identified.     Abdomen/pelvis: Increase activity seen within the bilateral pelvic cavity   in the region of the internal iliac vessels, which may be vascular in   nature. No hypermetabolic hepatic lesions are noted. No definite   hypermetabolic nodes are seen.     Skeletal system: No hypermetabolic osseous lesions are identified.     Additional CT findings: Please see recent CT chest, abdomen and pelvis   report of 10/22/2019.     Uncorrected PET images: No additional abnormalities.     IMPRESSION     1. Post surgical changes of prior left upper lobe wedge resection with   increased uptake about the resection margin, concerning for residual or   recurrent tumor.     2. New pulmonary nodule in the left upper lobe noted on CT of 10/22/2019   demonstrates significant increase FDG uptake, concerning for pulmonary   metastasis.       ?Finalized by Particia Jasper, M.D. on 10/29/2019 4:17 PM. Dictated by Particia Jasper, M.D. on 10/29/2019 4:06 PM.     CT CHEST AND ABDOMEN     Clinical Indication: ?Female, 69 years old. Hepatocellular carcinoma.   Non-small cell lung cancer, unspecified laterality. HCC restaging.     Technique: Multiple contiguous axial images were obtained through the   chest and abdomen following the administration of IV contrast material.   Noncontrast as well as hepatic arterial, portal venous and delayed imaging   was obtained through the abdomen. Post processing coronal and sagittal   reconstruction images were made from the axial images.     IV contrast: Yes   Bowel contrast: ?None     Comparison: CT chest and abdomen on 03/12/2019 and CT abdomen on 06/18/2019.     CHEST FINDINGS:     Lower Neck: Unchanged sub-5 mm low-attenuation right thyroid nodule,   incompletely characterized by CT but probably benign. This does not meet   size threshold criteria for further ultrasound workup. No lower cervical   lymphadenopathy.     Axilla, Mediastinum and Hila: No thoracic lymphadenopathy.     Heart and Great Vessels: Heart size normal. Coronary artery and aortic   valve calcifications. Thoracic aorta normal in caliber. Mild   atherosclerotic plaque.     Airway, Lungs and Pleura: Central airways patent. Previous left upper lobe   wedge resection. Unchanged mild linear scarring associated with the suture   material at the wedge resection site. Mild bilateral areas of scarring   and/or atelectasis throughout the lungs.     Development of 11 mm irregular left upper lobe pulmonary nodule (series 3   image 20). No significant change in several previously noted benign sub-5   mm nodular opacities in the lungs since 05/16/2017, for example right upper   lobe (series 3 images 7 and 9). Previously suggested new right upper lobe   sub-5 mm nodular opacity is not definitively visualized on this exam. No   pleural effusion.     Chest Wall and Osseous Structures: Lower cervical and thoracic   spondylosis. No destructive osseous lesion.     ABDOMEN FINDINGS:     Liver and Biliary system: Cirrhosis. Cholecystectomy. Unchanged mild   biliary ductal ectasia with smooth tapering at the ampulla.     Liver observations:     Segment 5/6 (series 3 image 12): Previous TACE and microwave ablations.   Unchanged appearance  of the treatment cavity. No appreciable internal   arterial enhancement to suggest recurrent tumor. LI-RADS treated,   nonviable.     No new arterially enhancing hepatic mass or focal area of washout.     Spleen: Unremarkable.     Adrenal Glands and Kidneys: Adrenal glands unremarkable. 2 mm   nonobstructing renal calculi. Unchanged small nonenhancing   hemorrhagic/proteinaceous left lower pole renal cyst (series 3 image 119)   disease. No hydronephrosis.     Pancreas and Retroperitoneum: Fatty infiltration of the pancreas. No   pancreatic ductal dilatation. No retroperitoneal lymphadenopathy.     Aorta and Major Vessels: Unchanged mild fusiform ectasia of the infrarenal   abdominal aorta. Moderate diffuse atherosclerotic plaque. Grossly patent   stent in the common hepatic artery. Small portosystemic varices.     Bowel, Mesentery and Peritoneal space: Small hiatal hernia. Visualized   upper abdominal bowel normal caliber. No mesenteric lymphadenopathy or   ascites.     Abdominal wall and Osseous Structures: Lumbar spondylosis. No destructive   osseous lesion.     IMPRESSION       CHEST:   1. ?No thoracic lymphadenopathy.     2. Previous left upper lobe wedge resection. Development of 11 mm   irregular pulmonary nodule in the left upper lobe suggesting recurrent   pulmonary malignancy or metastasis. Biopsy recommended as clinically   indicated.     3. Scattered areas of scarring in the lungs. No significant change in   scattered benign sub-5 mm nodular opacities. No pleural effusion. ?     ABDOMEN:   1. ?Previously treated segment 5/6 hepatic observation. No appreciable   arterial enhancement to suggest recurrent tumor. LI-RADS treated,   nonviable.     2. 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 No   splenomegaly or ascites.     3. Several subcentimeter nonobstructing renal calculi. ?       ?Finalized by Ancil Boozer, M.D. on 10/22/2019 11:15 AM. Dictated by Ancil Boozer, M.D. on 10/22/2019 10:51 AM.        Assessment and Plan:  Impression:  Ms. 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     Assessment and plan:  1. 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  She has had evidence of decompensation with concerns for early encephalopathy, trace ascites on imaging as well as noted to have small varices on EGD in the past.  Her current MELD score is 7.  She has overall been doing well from a liver perspective other than local recurrence of her hepatocellular carcinoma and concern for recurrent tumor/pulmonary metastasis noted on CT and PET scan in October 2021.  Her liver tests remain normal.  She does have identified barriers to liver transplantation with multiple malignancies including diagnosis of a lung cancer early in 2019 and probable recurrence September 2021.   continue to monitor MELD labs every 3 months at this time.  2. Autoimmune hepatitis.  The patient has biopsy-proven autoimmune hepatitis with positive ANA, and negative ASMA with elevated IgG level that was elevated at 4547 at time  of initial presentation.  She has achieved biochemical remission with normalization of her liver tests.  With her underlying history of cirrhosis, would continue azathioprine at low dose at this time given her overall clinical stability.  She will remain on azathioprine 50 mg daily and reviewed potential risks with long-term immunosuppression including increased risk for skin cancer and lymphoma.  She will need lab monitoring at minimum every 3 months.  She does have a history of squamous cell carcinoma and will need close monitoring by dermatology.  3. Hepatocellular carcinoma.  She had an additional liver lesion measuring 1.7 cm in segment 5/6 with initial biopsy in March 29, 2017 that was negative for malignancy, but follow up on April 30, 2016, it showed a well-differentiated hepatocellular carcinoma.  She had microwave ablation in June 2019 with a follow up microwave ablation on August 22, 2017.  She then had evidence of local recurrence with subsequent microwave ablation on February 07, 2019.  She continues to follow with Dr. Dayton Bailiff oncology with normal AFP at 1.7 in September 2021 and abdominal CT September 2021 showing segment 5/6 LI RADS treated nonviable liver lesion.  Will defer AFP and imaging to [name] oncology.  4. History of non-small cell lung cancer.  She has had resection of stage I non-small cell lung cancer with removal in June of 2019 by Dr. Bryson Dames.  CT chest September 2021 showed a new 11 mm pulmonary nodule in the left upper lobe with PET scan October 2021 showing recurrent tumor/pulmonary metastasis.  She has office visit scheduled with thoracic oncology and cardiothoracic surgeon for evaluation.  5. Volume management.  She does have a history of trace ascites on previous imaging as well as lower extremity edema in the past.  Overall, appears to be stable. She had a trial off of diuretics however reports increased edema.  She will remain on furosemide 20 mg daily and spironolactone 50 mg daily.  Additionally recommend she follow low-sodium diet less than 2000 mg/day.  6. Hepatic encephalopathy.  There has been concern for hepatic encephalopathy in the past,  No current symptoms at this time.  She should continue Constulose and titrate to 2-4 loose stools daily and Xifaxan.  She should not drive or operate heavy machinery if symptoms are present.  She reports rare use of oxycodone/acetaminophen to treat phantom limb pain, advised this can trigger or worsen hepatic encephalopathy and recommend she continue to use sparingly.  7. Esophageal varices.  Last EGD was April 2019 with LA grade B esophagitis as well as trace esophageal varices with erosive gastropathy.  EGD April 2021 showed small esophageal varices and would recommend repeat EGD in 1 year for surveillance.  8. Colon cancer screening.  She had a colonoscopy on May 18, 2017, with good bowel prep.  There were no evidence of polyps.  There were small and large amount of diverticula in the sigmoid and descending colon with nonbleeding internal and external hemorrhoids.  She was recommended a 10 year follow up.  9. H. pylori.  The patient was positive for H. pylori.  On previous gastric biopsies.  She completed triple therapy.  We would recommend future H. pylori stool antigen to document eradication.  10. Bone health.  She had bone densitometry in March 21, 2018 with evidence of osteoporosis.  We have recommended a referral to Endocrinology Clinic and she has office visit scheduled this month for evaluation.  History of vitamin D deficiency and recommend to periodically monitor this level.  11. Gastroesophageal reflux disease symptoms.  She has reported some increasing gastroesophageal reflux disease symptoms.  She has been prescribed PPI with omeprazole with improvement in symptoms.  12. Spontaneous bacterial peritonitis prophylaxis.  She had been previously on Bactrim for spontaneous bacterial peritonitis prophylaxis with mild ascites.  A follow up imaging has shown no evidence of recurrent ascites, therefore Bactrim was discontinued.  13. Vaccination status.  She is immune to hepatitis A.  She has recently completed hepatitis B vaccination series on March 17, 2019.  We would recommend checking future hepatitis B surface antibody with lab testing to evaluate for seroconversion.  She has already received annual influenza vaccination.  She has received COVID-19 vaccine series with Moderna and I recommend she obtain booster vaccine at this time.  14. Follow-up.  Patient to return for 3 to 29-month telehealth office visit with Dr. Earle Gell sooner as needed.  She should continue with lab monitoring every 3 months with her history of autoimmune hepatitis on azathioprine.  We have asked the patient to call or contact us in the interim if any further changes or updates to her clinical condition.     Plan:  -MELD Q 3 mo  -Continue azathioprine 50 mg daily indefinitely  -Defer AFP and imaging to [name] Oncology, has been referred to cardiothoracic surgery and thoracic oncology with concerns for recurrence of tumor at left upper lobe of her lung  -New left upper lobe lung nodule concerning for recurrent tumor, patient to follow with thoracic oncology and cardiothoracic surgeon for evaluation  -Recommend COVID-19 vaccine booster    45 minutes spent on this patient's encounter with counseling and coordination of care taking >50% of the visit.    Thank you very much for the opportunity to participate in the care of this patient.  Please do not hesitate to call or contact me at [phone] if I may be of further assistance in this patient's care.    _____________________________  Coralee North APRN   [facility]  Hepatology and Liver Transplant  Office Number: [phone]

## 2019-11-12 ENCOUNTER — Encounter: Admit: 2019-11-12 | Discharge: 2019-11-12 | Payer: MEDICARE

## 2019-11-12 ENCOUNTER — Ambulatory Visit: Admit: 2019-11-12 | Discharge: 2019-11-12 | Payer: MEDICARE

## 2019-11-12 DIAGNOSIS — C220 Liver cell carcinoma: Secondary | ICD-10-CM

## 2019-11-12 DIAGNOSIS — Z87891 Personal history of nicotine dependence: Secondary | ICD-10-CM

## 2019-11-12 DIAGNOSIS — K5790 Diverticulosis of intestine, part unspecified, without perforation or abscess without bleeding: Secondary | ICD-10-CM

## 2019-11-12 DIAGNOSIS — R911 Solitary pulmonary nodule: Secondary | ICD-10-CM

## 2019-11-12 DIAGNOSIS — D849 Immunodeficiency, unspecified: Secondary | ICD-10-CM

## 2019-11-12 DIAGNOSIS — J449 Chronic obstructive pulmonary disease, unspecified: Secondary | ICD-10-CM

## 2019-11-12 DIAGNOSIS — C3412 Malignant neoplasm of upper lobe, left bronchus or lung: Secondary | ICD-10-CM

## 2019-11-12 DIAGNOSIS — C499 Malignant neoplasm of connective and soft tissue, unspecified: Secondary | ICD-10-CM

## 2019-11-12 DIAGNOSIS — C4402 Squamous cell carcinoma of skin of lip: Secondary | ICD-10-CM

## 2019-11-12 DIAGNOSIS — D899 Disorder involving the immune mechanism, unspecified: Secondary | ICD-10-CM

## 2019-11-12 DIAGNOSIS — K219 Gastro-esophageal reflux disease without esophagitis: Secondary | ICD-10-CM

## 2019-11-12 DIAGNOSIS — K7581 Nonalcoholic steatohepatitis (NASH): Secondary | ICD-10-CM

## 2019-11-12 DIAGNOSIS — C801 Malignant (primary) neoplasm, unspecified: Secondary | ICD-10-CM

## 2019-11-12 DIAGNOSIS — K7210 Chronic hepatic failure without coma: Secondary | ICD-10-CM

## 2019-11-12 DIAGNOSIS — K759 Inflammatory liver disease, unspecified: Secondary | ICD-10-CM

## 2019-11-12 DIAGNOSIS — J439 Emphysema, unspecified: Secondary | ICD-10-CM

## 2019-11-12 NOTE — Patient Instructions
If you have any questions, please contact Heather, Terricka Onofrio or Whitney at [phone] (M-F 8a-4:30p)  After hours, you may call [phone] (Evenings/Weekends/Holidays)

## 2019-11-13 ENCOUNTER — Encounter: Admit: 2019-11-13 | Discharge: 2019-11-13 | Payer: MEDICARE

## 2019-11-13 ENCOUNTER — Ambulatory Visit: Admit: 2019-11-13 | Discharge: 2019-11-14 | Payer: MEDICARE

## 2019-11-13 DIAGNOSIS — E559 Vitamin D deficiency, unspecified: Secondary | ICD-10-CM

## 2019-11-13 DIAGNOSIS — C4402 Squamous cell carcinoma of skin of lip: Secondary | ICD-10-CM

## 2019-11-13 DIAGNOSIS — K759 Inflammatory liver disease, unspecified: Secondary | ICD-10-CM

## 2019-11-13 DIAGNOSIS — R911 Solitary pulmonary nodule: Secondary | ICD-10-CM

## 2019-11-13 DIAGNOSIS — K219 Gastro-esophageal reflux disease without esophagitis: Secondary | ICD-10-CM

## 2019-11-13 DIAGNOSIS — K7581 Nonalcoholic steatohepatitis (NASH): Secondary | ICD-10-CM

## 2019-11-13 DIAGNOSIS — C499 Malignant neoplasm of connective and soft tissue, unspecified: Secondary | ICD-10-CM

## 2019-11-13 DIAGNOSIS — C220 Liver cell carcinoma: Secondary | ICD-10-CM

## 2019-11-13 DIAGNOSIS — K5790 Diverticulosis of intestine, part unspecified, without perforation or abscess without bleeding: Secondary | ICD-10-CM

## 2019-11-13 DIAGNOSIS — J439 Emphysema, unspecified: Secondary | ICD-10-CM

## 2019-11-13 DIAGNOSIS — D899 Disorder involving the immune mechanism, unspecified: Secondary | ICD-10-CM

## 2019-11-13 DIAGNOSIS — C801 Malignant (primary) neoplasm, unspecified: Secondary | ICD-10-CM

## 2019-11-14 ENCOUNTER — Encounter: Admit: 2019-11-14 | Discharge: 2019-11-14 | Payer: MEDICARE

## 2019-11-14 DIAGNOSIS — K7460 Unspecified cirrhosis of liver: Secondary | ICD-10-CM

## 2019-11-14 DIAGNOSIS — K7581 Nonalcoholic steatohepatitis (NASH): Secondary | ICD-10-CM

## 2019-11-14 DIAGNOSIS — M810 Age-related osteoporosis without current pathological fracture: Secondary | ICD-10-CM

## 2019-11-18 ENCOUNTER — Encounter: Admit: 2019-11-18 | Discharge: 2019-11-18 | Payer: MEDICARE

## 2019-11-23 NOTE — Progress Notes
[facility]      PATIENT'S NAME: Sarah Pineda        DATE OF BIRTH / AGE: Dec 12, 1950 / 69 y.o 19, 1952 / 69 y.o.  MEDICAL RECORD NUMBER: [id number]  ENCOUNTER DATE: 11/24/2019      DIAGNOSIS: LUL sqNSCLC with sarcomatoid feature, 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, pT1bNx; now with a new LUL nodule suspicious for recurrence vs. New primary.      CHIEF COMPLAINT: For consideration of chemotherapy      HISTORY OF PRESENT ILLNESS: Details in the ONCOLOGY HISTORY. Briefly this is a 69yo lady with hx of sarcoma, hepatocellular carcinoma, emphysema, NASH, ESLD MELD 7. She is known to Dr. 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 She has continued to follow with [facility] for her HCC. Recent scans showed a new FDG avid lesion on LUL, suspicious for recurrence vs. New primary. Of note, patient was evaluated by Dr. Derenda Fennel and considered not an ideal surgical candidate due to her liver disease.      CURRENT TREATMENT: Pending SBRT      PAST TREATMENT:  - 07/16/17: LUL wedge resection      ONCOLOGY HISTORY:  - Smoking hx: smoked 30 years, 0.5 pack per day on average. quitted for 3 years.  - 07/16/17: Left video assisted thoracoscopy left upper lobe wedge resection by Dr. Derenda Fennel. 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  - 10/22/19: CT?C/A/P: Development of 11 mm irregular pulmonary nodule in the left upper lobe suggesting recurrent pulmonary malignancy or metastasis.  - 10/29/19: PET: LUL nodule max SUV 9.5. 1. Post surgical changes of prior left upper lobe wedge resection with increased uptake about the resection margin, concerning for residual or   recurrent tumor. 2. New pulmonary nodule in the left upper lobe noted on CT of 10/22/2019 demonstrates significant increase FDG uptake, concerning for pulmonary metastasis.   - 11/12/19: PFT showed: FEV1 of 85% predicted and DLCO of 93% predicted        INTERVAL HISTORY: n/a      PAST MEDICAL HISTORY:  Medical History:   Diagnosis Date   ? Cancer (HCC)     bone/ right leg   ? Diverticulosis    ? Emphysema lung (HCC)    ? GERD (gastroesophageal reflux disease)     Tums PRN   ? Hepatitis    ? Hepatocellular carcinoma (HCC)    ? Immune disorder (HCC)    ? Nonalcoholic steatohepatitis    ? Pulmonary nodule, left    ? 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 Squamous cell cancer of lip          PAST SURGICAL HISTORY:  Surgical History:   Procedure Laterality Date   ? AMPUTATION  1970    right leg-bone cancer   ? HX TUBAL LIGATION  1977   ? HX CHOLECYSTECTOMY  2004   ? ESOPHAGOGASTRODUODENOSCOPY WITH BIOPSY - FLEXIBLE N/A 05/18/2017    Performed by Virgina Organ, MD at [facility] ENDO   ? COLONOSCOPY DIAGNOSTIC WITH SPECIMEN COLLECTION BY BRUSHING/ WASHING - FLEXIBLE N/A 05/18/2017    Performed by Virgina Organ, MD at [facility] ENDO   ? LUNG SURGERY  07/24/2017    removal of lung wedge   ? left video assisted thoracoscopy with wedge resection Left 07/24/2017    Performed by Bryson Dames, MD at [facility]   ?  ESOPHAGOGASTRODUODENOSCOPY WITH SPECIMEN COLLECTION BY BRUSHING/ WASHING N/A 05/23/2019    Performed by Dawna Part, MD at [facility] ENDO   ? LIVER SURGERY  06/2017, 07/2017    x 2 Ablasion    ? LYMPH NODE BIOPSY      back   ? SKIN CANCER EXCISION      sqc- lip and nose         FAMILY HISTORY:  Family History   Problem Relation Age of Onset   ? Heart Failure Mother    ? Cancer Mother         Stomach   ? Heart Failure Father         bypass   ? Heart Disease Father    ? Stroke Brother         age 57   ? Cancer-Colon Brother    ? Aneurysm Brother    ? Diabetes Brother    ? Abnormal EKG Brother         Afib   ? Cancer Sister         Pancreatic   ? Heart Disease Maternal Grandfather    ? Melanoma Neg Hx           SOCIAL HISTORY:  Social History     Socioeconomic History   ? Marital status: Married     Spouse name: Not on file ? Number of children: Not on file   ? Years of education: Not on file   ? Highest education level: Not on file   Occupational History   ? Not on file   Tobacco Use   ? Smoking status: Former Smoker     Packs/day: 0.50     Years: 50.00     Pack years: 25.00     Types: Cigarettes     Quit date: 03/10/2017     Years since quitting: 2.7   ? Smokeless tobacco: Never Used   Vaping Use   ? Vaping Use: Never used   Substance and Sexual Activity   ? Alcohol use: Not Currently     Comment: couple times/year   ? Drug use: Never   ? Sexual activity: Not on file   Other Topics Concern   ? Not on file   Social History Narrative   ? Not on file          ALLERGY:   Allergies   Allergen Reactions   ? Ciprofloxacin BLISTERS   ? Latex RASH and REDNESS     Pt states it tore up her skin          MEDICATION:  Current Outpatient Medications on File Prior to Visit   Medication Sig Dispense Refill   ? aspirin EC 81 mg tablet Take 81 mg by mouth daily after lunch. Take with food.      ? azaTHIOprine (IMURAN) 50 mg tablet TAKE 1 TABLET BY MOUTH AT BEDTIME 90 tablet 3   ? calcium carbonate/vitamin D2 (CALCIUM + VITAMIN D PO) Take  by mouth twice daily.     ? CONSTULOSE 10 gram/15 mL oral solution TAKE BY MOUTH TWO TO THREE TIMES DAILY (TITRATES TO 2-3 BOWEL MOVEMENTS DAILY) 1892 mL 11   ? furosemide (LASIX) 20 mg tablet Take one tablet by mouth every morning. 90 tablet 3   ? furosemide (LASIX) 20 mg tablet Take 20 mg by mouth every morning.     ? omeprazole DR (PRILOSEC) 20 mg capsule Take one capsule by mouth daily before  breakfast. 90 capsule 3   ? oxyCODONE-acetaminophen (PERCOCET) 7.5-325 mg tablet Take 1 tablet by mouth every 12 hours as needed for Pain     ? rifAXIMin (XIFAXAN) 550 mg tablet Take one tablet by mouth every 12 hours. 60 tablet 11   ? spironolactone (ALDACTONE) 50 mg tablet Take one tablet by mouth daily with breakfast. Take with food. 30 tablet 5     No current facility-administered medications on file prior to visit.          REVIEW OF SYSTEMS:    Constitutional: No weight loss. No fever or chills  Eyes: No red eyes. No itchy eyes. No double vision. No change on vision acuity    Ear, Nose, Mouth & Throat: No hearing loss. No discharge. No sore throat   Cardiovascular: No chest pain. No palpitations. No syncope  Respiratory: + mild shortness of breath with exertion. + Occasional cough  Gastrointestinal: No nausea or vomiting. No difficulty swallowing. No abdominal pain. No diarrhea or constipation. No heartburn, throat pain or difficulty swallowing.  Genitourinary: No burning urination. No change in the color of urine  Musculoskeletal: No joints pain. No swelling of the upper or lower extremities    Integumentary: No rash.    Neuro:  No headache. No focal weakness or numbness in the upper or lower extremities  Endocrine: No heat or cold intolerance. No excessive sweating  All other systems are negative.       PHYSICAL EXAMINATION:  Vitals:    11/24/19 1014   BP: 136/57   Pulse: 70   Resp: 16   Temp: 36.9 ?C (98.4 ?F)   SpO2: 98%         ECOG performance status: 1    Constitutional: Alert and oriented x3.    Eyes: Pupils are equal bilaterally. No redness. No jaundice.   Ear, Nose, Mouth & Throat: Ear canals are patent. No discharge from the nose. Mucous membranes are moist  Cardiovascular: Regular rate and rhythm.    Respiratory: Good air entry bilaterally. No crackles or wheezes.  Gastrointestinal: Soft. Nontender. Nondistended. Good bowel sounds. No hepatosplenomegaly    Musculoskeletal & Extremities: No limitation in the joints' range of motion. No lower extremity edema  Neuro:  CN II-XII intact. Strength in the upper and lower extremities: intact. Sensations in the upper and lower extremities: intact    Integumentary: No rash.    Heme/Lymph/Immunology: No cervical, supraclavicular, axillary or inguinal lymphadenopathy      LABS: To be ordered      PATHOLOGY: See above      RADIOLOGY: See above      ASSESSMENT & PLANS:    LUL sqNSCLC with sarcomatoid feature, 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, pT1bNx; now with a new LUL nodule suspicious for recurrence vs. New primary.  - Patient is seen by thoracic surgeon Dr. Derenda Fennel and considered not an ideal surgical candidate.  - PET/CT showed increased uptake about the resection margin, which is concerning for residual or recurrent tumor. If that's the case, it can be justified for T3 disease and making adjuvant chemotherapy reasonable after SBRT. However I did tell patient that this is an extrapolation of data from patients who got complete surgical resection, and the 5 year survival benefit is marginal. If chemotherapy is indicated, will have a discussion with [name] due to her liver disease.  - Will refer patient to our Rad/Onc colleagues for consideration of SBRT.  - Will also discuss with Dr. Derenda Fennel and radiologist regarding  the probability of residual/recurrent tumor over the surgical margin. If questionable, we may want to have a biopsy as this could change our management.   - I think it is reasonable to have an EBUS staging as well, which if positive, could potentially change our management.  - Tentatively RTC in 3 weeks for follow up.      Above A/P were explained in detail to the patient and family who expressed understanding. All the relevant questions were addressed appropriately. Patient and family understand in case of fever (>100.4), worsening chest pain or shortness of breath, or any other new symptoms, our on-call physician can be reached and 911 shall be called in case of emergencies.     ---  Willis Modena, MD, PhD  Associate Professor  Division of Medical Oncology  Department of Internal Medicine  [facility]    CC:  Bryson Dames, MD  Raed Tamsen Meek, MD

## 2019-11-24 ENCOUNTER — Encounter: Admit: 2019-11-24 | Discharge: 2019-11-24 | Payer: MEDICARE

## 2019-11-24 ENCOUNTER — Ambulatory Visit: Admit: 2019-11-24 | Discharge: 2019-12-03 | Payer: MEDICARE

## 2019-11-24 DIAGNOSIS — R911 Solitary pulmonary nodule: Secondary | ICD-10-CM

## 2019-11-24 DIAGNOSIS — C3490 Malignant neoplasm of unspecified part of unspecified bronchus or lung: Secondary | ICD-10-CM

## 2019-11-24 DIAGNOSIS — K7581 Nonalcoholic steatohepatitis (NASH): Secondary | ICD-10-CM

## 2019-11-24 DIAGNOSIS — C499 Malignant neoplasm of connective and soft tissue, unspecified: Secondary | ICD-10-CM

## 2019-11-24 DIAGNOSIS — K759 Inflammatory liver disease, unspecified: Secondary | ICD-10-CM

## 2019-11-24 DIAGNOSIS — D899 Disorder involving the immune mechanism, unspecified: Secondary | ICD-10-CM

## 2019-11-24 DIAGNOSIS — K5790 Diverticulosis of intestine, part unspecified, without perforation or abscess without bleeding: Secondary | ICD-10-CM

## 2019-11-24 DIAGNOSIS — C801 Malignant (primary) neoplasm, unspecified: Secondary | ICD-10-CM

## 2019-11-24 DIAGNOSIS — K219 Gastro-esophageal reflux disease without esophagitis: Secondary | ICD-10-CM

## 2019-11-24 DIAGNOSIS — C4402 Squamous cell carcinoma of skin of lip: Secondary | ICD-10-CM

## 2019-11-24 DIAGNOSIS — C220 Liver cell carcinoma: Secondary | ICD-10-CM

## 2019-11-24 DIAGNOSIS — J439 Emphysema, unspecified: Secondary | ICD-10-CM

## 2019-11-24 NOTE — Patient Instructions
To reschedule appointments please contact: [phone]    To speak to a nurse between the hours of 8:00 to 4:00 please call [phone]    Dr. Phylliss Bob nurse: Irving Burton   Dr. Teola Bradley nurse: Kylie/Caitlin   Dr. Ina Kick nurse: Silver Huguenin, APRN: Luther Parody     Please ensure your schedule is correct prior to leaving clinic and that all your refill requests have been taken care of.      Please allow 5-7 business days to complete any paperwork.    Please DO NOT PAGE FOR NARCOTIC REFILLS.  Allow 3 business days for narcotic refills.    If you need refills, please contact your pharmacy first.  They will process the refill request.    Please try to avoid filling your narcotic prescriptions at [facility], [facility], or [facility].    PLEASE BRING YOUR NAUSEA AND OR PAIN MEDICATION WITH YOU TO YOUR APPOINTMENT.  WE DO NOT HAVE ACCESS TO THOSE MEDICATIONS IN CLINIC.            Taking Opioid Medicines  For your health and safety, it?s important to take opioids exactly as directed. This helps make sure they work as they should. It also lowers the chances of side effects and the risk for taking too high a dose ( overdose). Each opioid medicine is different and has its own instructions for use. Your healthcare provider will help you understand the ones you?re prescribed and how to take them. If you have questions or concerns, be sure to talk about them with your healthcare provider.?   Using opioids safely  Opioids can work very well to ease pain. But taking too much, taking them too long, or not taking them the right way can be harmful. To help reduce the risks to your health, be sure to follow these safety tips:   ? Know if you are supposed to take the medicine on a regular basis or only as needed.  ? If your medicine is taken on a regular basis, take it on time and in the right dose. If you miss a dose, don?t double up the next dose.  ? Use a medicine log, app, or calendar to keep track of when you take your medicine. This helps you stay on schedule and avoid missing doses or taking extra doses.  ? When taking liquid doses of opioids, use a measuring spoon or dropper. This way you can be sure to get the correct dose.  ? Report any side effects that you have to your healthcare provider right away.  ? Don?t cut, crush, or alter your medicine in any way.  ? Don?t take someone else?s opioids or share yours with others.  ? Don?t drive or use dangerous equipment or power tools while taking opioids.  ? Check expiration dates regularly. Throw out any expired medicines properly.?  Beware of medicine interactions  Certain medicines can be dangerous, even fatal, when used with opioids. That?s why it?s important tell your healthcare provider and pharmacist about all the medicines you?re taking. This includes over-the-counter medicines, herbal remedies, supplements, and even illegal or street drugs. Medicines that may be unsafe to use with opioids include:   ? Other over-the-counter pain relievers, such as acetaminophen  ? Other prescription opioids  ? Benzodiazepines (clonazepam, alprazolam, or other like medicines)  ? Muscle relaxants (cyclobenzaprine, carisoprodol, or other like medicines)  ? Hypnotics (sleep aids like zolpidem or other like medicines)  Warning: Never combine opioids with alcohol or street drugs. This can  be fatal.   Symptoms of opioid overdose  Opioids affect the part of the brain that controls breathing. An overdose of opioids can slow breathing down too much and even stop a person?s breathing. This can be fatal. Call 911 right away if an overdose is suspected in any person.   Three key symptoms to look for are:  ? Narrowing of dark circles in the middle of eyes (pinpoint pupils)  ? Breathing that has slowed or stopped  ? Unconsciousness. This is when a person passes out and does not respond.  Other symptoms to look for include:  ? Limp body  ? Pale face  ? Clammy skin  ? Purple or blue color lips and fingernails  ? Vomiting  Storing opioids safely  Opioids need to be stored safely. This helps protect others (including adults and children) from accidentally taking the medicine. It also helps prevent the theft and misuse of the medicine. If possible, store the medicine in a locked container or cupboard that others cannot access. Store the medicine in a cool dry place. Don't use bathrooms, if possible. Always put the medicine back in its secure place after each use.?   Disposing opioids  Unused or expired opioids must be thrown away properly to prevent harm. Don?t save your medicine or give it to others for any reason. Even a single dose of opioids can lead to death if it used by someone other than who the medicine is prescribed for. To dispose of your medicine safely:   ? Find your community?s medicine take-back program. This may involve dropping off the medicine at a local police station or pharmacy.  ? Some pharmacies also have mail-back programs. This often involves sending the medicine through the mail using a special medicine disposal envelope.  If these options are not available to you, ask your healthcare provider for help.  FDA guidelines for disposal   The FDA also has guidelines for flushing opioids down the toilet or disposing them in the trash. You can learn more at the following website: https://haas-stevens.biz/. Always check with your local water and waste management company to find out if flushing opioids is allowed in your city or state.?   Stopping opioid treatment  If you have been taking an opioid for more than a few weeks, your body gets used to having it. When you stop taking the medicine, withdrawal symptoms may develop that range from mild to severe. The list of possible withdrawal symptoms is very long. They can include:   ? Restlessness and anxiety  ? Muscle aches  ? Sweating  ? Dilated pupils  ? Watery eyes  ? Runny nose  ? Problems sleeping  ? Nausea or vomiting  ? Abdominal cramping  ? Diarrhea  ? Rapid heartbeat  To stop opioid treatment safely and to help manage withdrawal symptoms, you will need help from your healthcare provider. In most cases, the amount of medicine you take will be cut down. You will be weaned off the medicine slowly over several weeks. If needed, other medicines and treatments may also be used to help with this process. As the opioid medicine clears from your system, your body will readjust to not having it. Withdrawal symptoms should then go away. How long this takes can vary for every person.   [employer] last reviewed this educational content on 06/24/2018  ? 2000-2021 [employer], [place]. All rights reserved. This information is not intended as a substitute for professional medical care. Always follow your  healthcare professional's instructions.

## 2019-11-24 NOTE — Telephone Encounter
Patient will need to be scheduled to see  Email sent to Marguarite Arbour with scheduling request.  Einar Crow, RN     Referred:  11/24/19 from Willis Modena, MD  Reason:  Procedure only - NSCLC - rule out locally advanced disease  Imaging:  PET 10/6  Pulmonologist:  Riki Sheer, MD

## 2019-11-27 ENCOUNTER — Ambulatory Visit: Admit: 2019-11-27 | Discharge: 2019-11-27 | Payer: MEDICARE

## 2019-11-27 ENCOUNTER — Encounter: Admit: 2019-11-27 | Discharge: 2019-11-27 | Payer: MEDICARE

## 2019-11-27 DIAGNOSIS — C3490 Malignant neoplasm of unspecified part of unspecified bronchus or lung: Secondary | ICD-10-CM

## 2019-11-27 DIAGNOSIS — Z20822 Encounter for screening laboratory testing for COVID-19 virus in asymptomatic patient: Secondary | ICD-10-CM

## 2019-11-27 MED ORDER — SODIUM CHLORIDE 0.9 % IV SOLP
INTRAVENOUS | 0 refills
Start: 2019-11-27 — End: ?

## 2019-11-28 ENCOUNTER — Encounter: Admit: 2019-11-28 | Discharge: 2019-11-28 | Payer: MEDICARE

## 2019-12-03 ENCOUNTER — Encounter: Admit: 2019-12-03 | Discharge: 2019-12-03 | Payer: MEDICARE

## 2019-12-03 ENCOUNTER — Ambulatory Visit: Admit: 2019-12-03 | Discharge: 2019-12-03 | Payer: MEDICARE

## 2019-12-03 DIAGNOSIS — C3490 Malignant neoplasm of unspecified part of unspecified bronchus or lung: Secondary | ICD-10-CM

## 2019-12-03 LAB — BASIC METABOLIC PANEL
Lab: 0.9 mg/dL (ref 0.4–1.00)
Lab: 13 mg/dL (ref 7–25)
Lab: 135 MMOL/L — ABNORMAL LOW (ref 137–147)
Lab: 27 MMOL/L (ref 21–30)
Lab: 4 MMOL/L (ref 3.5–5.1)
Lab: 58 mL/min — ABNORMAL LOW (ref >60)
Lab: 79 mg/dL (ref 70–100)
Lab: 9 g/dL (ref 3–12)
Lab: 9.7 mg/dL (ref 8.5–10.6)
Lab: 99 MMOL/L (ref 98–110)
Lab: >60 mL/min (ref >60)

## 2019-12-03 LAB — CBC
Lab: 3.9 M/UL — ABNORMAL LOW (ref 4.0–5.0)
Lab: 5.6 K/UL (ref 4.5–11.0)

## 2019-12-05 ENCOUNTER — Ambulatory Visit: Admit: 2019-12-05 | Discharge: 2019-12-05 | Payer: MEDICARE

## 2019-12-05 ENCOUNTER — Encounter: Admit: 2019-12-05 | Discharge: 2019-12-05 | Payer: MEDICARE

## 2019-12-05 MED ORDER — FENTANYL CITRATE (PF) 50 MCG/ML IJ SOLN
INTRAVENOUS | 0 refills | Status: DC
Start: 2019-12-05 — End: 2019-12-05
  Administered 2019-12-05: 19:00:00 100 ug via INTRAVENOUS

## 2019-12-05 MED ORDER — SUCCINYLCHOLINE CHLORIDE 20 MG/ML IJ SOLN
INTRAVENOUS | 0 refills | Status: DC
Start: 2019-12-05 — End: 2019-12-05
  Administered 2019-12-05: 19:00:00 100 mg via INTRAVENOUS

## 2019-12-05 MED ORDER — PROPOFOL INJ 10 MG/ML IV VIAL
INTRAVENOUS | 0 refills | Status: DC
Start: 2019-12-05 — End: 2019-12-05
  Administered 2019-12-05: 19:00:00 140 mg via INTRAVENOUS
  Administered 2019-12-05: 19:00:00 60 mg via INTRAVENOUS

## 2019-12-05 MED ORDER — LIDOCAINE (PF) 200 MG/10 ML (2 %) IJ SYRG
INTRAVENOUS | 0 refills | Status: DC
Start: 2019-12-05 — End: 2019-12-05
  Administered 2019-12-05: 19:00:00 80 mg via INTRAVENOUS

## 2019-12-05 MED ORDER — ONDANSETRON HCL (PF) 4 MG/2 ML IJ SOLN
INTRAVENOUS | 0 refills | Status: DC
Start: 2019-12-05 — End: 2019-12-05
  Administered 2019-12-05: 20:00:00 4 mg via INTRAVENOUS

## 2019-12-05 MED ORDER — ARTIFICIAL TEARS SINGLE DOSE DROPS GROUP
OPHTHALMIC | 0 refills | Status: DC
Start: 2019-12-05 — End: 2019-12-05
  Administered 2019-12-05: 19:00:00 2 [drp] via OPHTHALMIC

## 2019-12-05 MED ORDER — PROPOFOL 10 MG/ML IV EMUL 50 ML (INFUSION)(AM)(OR)
INTRAVENOUS | 0 refills | Status: DC
Start: 2019-12-05 — End: 2019-12-05
  Administered 2019-12-05: 19:00:00 150 ug/kg/min via INTRAVENOUS

## 2019-12-05 MED ORDER — FAMOTIDINE (PF) 20 MG/2 ML IV SOLN
INTRAVENOUS | 0 refills | Status: DC
Start: 2019-12-05 — End: 2019-12-05
  Administered 2019-12-05: 19:00:00 20 mg via INTRAVENOUS

## 2019-12-05 MED ORDER — DEXAMETHASONE SODIUM PHOSPHATE 4 MG/ML IJ SOLN
INTRAVENOUS | 0 refills | Status: DC
Start: 2019-12-05 — End: 2019-12-05
  Administered 2019-12-05: 19:00:00 4 mg via INTRAVENOUS

## 2019-12-05 MED ADMIN — TRANEXAMIC ACID 1,000 MG/10 ML (100 MG/ML) IV SOLN [309767]: 3 mL | TOPICAL | @ 19:00:00 | Stop: 2019-12-05 | NDC 67457019710

## 2019-12-05 MED ADMIN — SODIUM CHLORIDE 0.9 % IV SOLP [27838]: 1000.000 mL | INTRAVENOUS | @ 18:00:00 | Stop: 2019-12-05 | NDC 00338004904

## 2019-12-05 NOTE — Anesthesia Pre-Procedure Evaluation
Anesthesia Pre-Procedure Evaluation    Name: Sarah Pineda      MRN: [id number]     DOB: 1950/11/04     Age: 69 y.o.     Sex: female   __________________________________________________________________________     Frederich Cha 05/23/2019  Procedure:  EGD     Physical Assessment  Vital Signs (last filed in past 24 hours):  BP: 137/82 (11/12 1133)  Temp: 36.8 ?C (98.2 ?F) (11/12 1133)  Pulse: 86 (11/12 1133)  Respirations: 18 PER MINUTE (11/12 1133)  SpO2: 98 % (11/12 1133)  Weight: 78.5 kg (173 lb) (11/12 1133)      Patient History  Allergies   Allergen Reactions   ? Ciprofloxacin BLISTERS   ? Latex RASH and REDNESS     Pt states it tore up her skin      Current Medications    Medication Directions   aspirin EC 81 mg tablet Take 81 mg by mouth daily after lunch. Take with food.    azaTHIOprine (IMURAN) 50 mg tablet TAKE 1 TABLET BY MOUTH AT BEDTIME   calcium carbonate/vitamin D2 (CALCIUM + VITAMIN D PO) Take  by mouth twice daily.   CONSTULOSE 10 gram/15 mL oral solution TAKE BY MOUTH TWO TO THREE TIMES DAILY (TITRATES TO 2-3 BOWEL MOVEMENTS DAILY)   furosemide (LASIX) 20 mg tablet Take one tablet by mouth every morning.   furosemide (LASIX) 20 mg tablet Take 20 mg by mouth every morning.   omeprazole DR (PRILOSEC) 20 mg capsule Take one capsule by mouth daily before breakfast.   oxyCODONE-acetaminophen (PERCOCET) 7.5-325 mg tablet Take 1 tablet by mouth every 12 hours as needed for Pain   rifAXIMin (XIFAXAN) 550 mg tablet Take one tablet by mouth every 12 hours.   spironolactone (ALDACTONE) 50 mg tablet Take one tablet by mouth daily with breakfast. Take with food.     Medical History:   Diagnosis Date   ? Cancer (HCC)     bone/ right leg   ? Diverticulosis    ? Emphysema lung (HCC)    ? GERD (gastroesophageal reflux disease)     Tums PRN   ? Hepatitis    ? Hepatocellular carcinoma (HCC)    ? Immune disorder (HCC)    ? Nonalcoholic steatohepatitis    ? Pulmonary nodule, left    ? 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 Squamous cell cancer of lip      Surgical History:   Procedure Laterality Date   ? AMPUTATION  1970    right leg-bone cancer   ? HX TUBAL LIGATION  1977   ? HX CHOLECYSTECTOMY  2004   ? ESOPHAGOGASTRODUODENOSCOPY WITH BIOPSY - FLEXIBLE N/A 05/18/2017    Performed by Virgina Organ, MD at [facility]   ? COLONOSCOPY DIAGNOSTIC WITH SPECIMEN COLLECTION BY BRUSHING/ WASHING - FLEXIBLE N/A 05/18/2017    Performed by Virgina Organ, MD at [facility]   ? LUNG SURGERY  07/24/2017    removal of lung wedge   ? left video assisted thoracoscopy with wedge resection Left 07/24/2017    Performed by Bryson Dames, MD at [facility]   ? ESOPHAGOGASTRODUODENOSCOPY WITH SPECIMEN COLLECTION BY BRUSHING/ WASHING N/A 05/23/2019    Performed by Dawna Part, MD at [facility] ENDO   ? LIVER SURGERY  06/2017, 07/2017    x 2 Ablasion    ? LYMPH NODE BIOPSY      back   ? SKIN CANCER EXCISION  sqc- lip and nose     Social History     Tobacco Use   ? Smoking status: Former Smoker     Packs/day: 0.50     Years: 50.00     Pack years: 25.00     Types: Cigarettes     Quit date: 03/10/2017     Years since quitting: 2.7   ? Smokeless tobacco: Never Used   Vaping Use   ? Vaping Use: Never used   Substance Use Topics   ? Alcohol use: Not Currently     Comment: couple times/year   ? Drug use: Never     Review of Systems/Medical History      Patient summary reviewed  Nursing notes reviewed  Pertinent labs reviewed    PONV Screening: Female gender and Non-smoker  No history of anesthetic complications  No family history of anesthetic complications      Airway - negative        Direct laryngoscopy, Rapid sequence, Stylet; Single-Lumen, Cuffed; 7mm; Mac; 3; Oral; 1-Full view of the glottis (with cricoid pressure); 1 insertion attempt      Pulmonary       Smoker:  Quit in Feb 2019 with 35 PYH.      COPD ( Not requiring treatment)      07/24/17- left thoracoscopy wedge resection, (+) non small cell lung carcinoma. Post wedge resection pneumothorax      09/25/18 CT Chest  1. ?No evidence of thoracic metastatic disease.   2. ?Several tiny right lung nodules remain stable since at least May 16, 2017, most compatible scars or granulomas. No new or enlarging pulmonary nodules are identified.   3. ?Mild emphysema and scattered areas of scarring with stable wedge resection changes in the posterior left upper lobe.   4. ?At least mild coronary artery calcification.      Cardiovascular - negative        Exercise tolerance: <4 METS (Uses cruches with one leg. Can walk 2 blocks without chest  pain. stairs are difficult. 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 HE is currently well managed with Lactulose)      Ascites ( Not requiring paracentesis. Taking low dose lasix for peripherial edema which is well controlled at this time.)      Esophageal varices ( Trace without history of GI bleed)     No renal disease      Hepatitis (atuoimmune taking Imurana)      Neuro/Psych         Weakness (RLE)      Dementia:  HE well controlled with medication regimen with 2 BMs daily on lactulose.      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 hx HCC s/p ablation. lung ca s/p wedge resection 2019)    Constitution - negative   Physical Exam    Airway Findings      Mallampati: I      TM distance: >3 FB      Neck ROM: full      Mouth opening: good      Airway patency: adequate    Dental Findings:       Poor dentition and increased risk for dental injury; pt advised          Cardiovascular Findings: Negative      Rhythm: regular      Rate: normal    Pulmonary Findings:       Breath sounds clear to auscultation.    Abdominal Findings:       Obese      Abdominal exam deferred    Neurological Findings: Negative      Alert and oriented x 3    Constitutional findings: Negative      No acute distress      Well-developed       Diagnostic Tests  Hematology:   Lab Results   Component Value Date    HGB 12.3 12/03/2019    HCT 36.3 12/03/2019    PLTCT 215 12/03/2019    WBC 5.6 12/03/2019    NEUT 84 10/22/2019    ANC 5.10 10/22/2019    LYMPH 25.7 06/04/2017    ALC 0.40 10/22/2019    MONA 6 10/22/2019    AMC 0.40 10/22/2019    EOSA 2 10/22/2019    ABC 0.10 10/22/2019    BASOPHILS 0.8 06/04/2017    MCV 92.1 12/03/2019    MCH 31.3 12/03/2019    MCHC 33.9 12/03/2019    MPV 8.1 12/03/2019    RDW 14.6 12/03/2019         General Chemistry:   Lab Results   Component Value Date    NA 135 12/03/2019    K 4.0 12/03/2019    CL 99 12/03/2019    CO2 27 12/03/2019    GAP 9 12/03/2019    BUN 13 12/03/2019    CR 0.96 12/03/2019    GLU 79 12/03/2019    GLU 93 08/20/2017    CA 9.7 12/03/2019    ALBUMIN 4.0 10/22/2019    MG 1.9 07/23/2017    TOTBILI 0.4 10/22/2019    PO4 4.2 07/23/2017      Coagulation:   Lab Results   Component Value Date    PT 12.0 08/20/2017    INR 1.1 10/22/2019         Anesthesia Plan    ASA score: 3   Plan: general  Induction method: intravenous  NPO status: acceptable      Informed Consent  Anesthetic plan and risks discussed with patient.  Use of blood products discussed with patient  Blood Consent: consented      Plan discussed with: anesthesiologist and CRNA.        LAB: 01/03/19 CBC/d, INR, CMP reviewed, WNL, except HGB 11.9 T&C DOS  Labs collected in [place] on 01/29/19- ordered by [name] ([facility])  ROI: [facility] hospital for 01/29/19 labs  Consult: none

## 2019-12-05 NOTE — Anesthesia Post-Procedure Evaluation
Post-Anesthesia Evaluation    Name: Sarah Pineda      MRN: [id number]     DOB: 1950/08/06     Age: 69 y.o.     Sex: female   __________________________________________________________________________     Procedure Information     Anesthesia Start Date/Time: 12/05/19 1250    Procedures:       BRONCHOSCOPY DIAGNOSTIC WITH/ WITHOUT CELL WASHING - FLEXIBLE (N/A Bronchus)      BRONCHOSCOPY WITH IMAGE - GUIDED NAVIGATION - FLEXIBLE (N/A Bronchus)      BRONCHOSCOPY WITH ENDOBRONCHIAL ULTRASOUND GUIDED TRANSTRACHEAL/ TRANSBRONCHIAL SAMPLING - 3 OR MORE MEDIASTINAL/ HILAR LYMPH NODE STATIONS/ STRUCTURE - FLEXIBLE (N/A Bronchus)      BRONCHOSCOPY WITH TRANSBRONCHIAL LUNG BIOPSY - FLEXIBLE - SINGLE LOBE (N/A )      BRONCHOSCOPY WITH TRANSBRONCHIAL NEEDLE ASPIRATION AND BIOPSY TRACHEA/ MAIN STEM/ LOBAR BRONCHUS - FLEXIBLE (N/A Bronchus)      BRONCHOSCOPY WITH BRUSHING/ PROTECTED BRUSHING - FLEXIBLE (N/A Bronchus)    Location: MAIN OR 18 / Main OR/Periop    Surgeons: Tacey Heap, Maykol R, MD          Post-Anesthesia Vitals  Temp: 36.6 C (97.9 F) (11/12 1400)   Vitals Value Taken Time   BP 116/80 12/05/19 1448   Temp     Pulse 73 12/05/19 1457   Respirations 21 PER MINUTE 12/05/19 1457   SpO2 94 % 12/05/19 1457   ABP     ART BP     Vitals shown include unvalidated device data.      Post Anesthesia Evaluation Note    Evaluation location: Pre/Post  Patient participation: recovered; patient participated in evaluation  Level of consciousness: alert    Pain score: 0  Pain management: adequate    Hydration: normovolemia  Temperature: 36.0C - 38.4C  Airway patency: adequate    Perioperative Events       Post-op nausea and vomiting: no PONV    Postoperative Status  Cardiovascular status: hemodynamically stable  Respiratory status: spontaneous ventilation  Follow-up needed: none        Perioperative Events  Perioperative Event: No  Emergency Case Activation: No

## 2019-12-08 ENCOUNTER — Encounter: Admit: 2019-12-08 | Discharge: 2019-12-08 | Payer: MEDICARE

## 2019-12-08 DIAGNOSIS — C3490 Malignant neoplasm of unspecified part of unspecified bronchus or lung: Secondary | ICD-10-CM

## 2019-12-09 ENCOUNTER — Ambulatory Visit: Admit: 2019-12-09 | Discharge: 2019-12-09 | Payer: MEDICARE

## 2019-12-12 ENCOUNTER — Ambulatory Visit: Admit: 2019-12-12 | Discharge: 2019-12-12 | Payer: MEDICARE

## 2019-12-12 DIAGNOSIS — C3490 Malignant neoplasm of unspecified part of unspecified bronchus or lung: Secondary | ICD-10-CM

## 2019-12-12 NOTE — Progress Notes
Telehealth Visit Note    Date of Service: 12/12/2019    Subjective:      Obtained patient's verbal consent to treat them and their agreement to [facility] financial policy and NPP via this telehealth visit during the [facility] Emergency       Sarah Pineda is a 69 y.o. female.    History of Present Illness  Sarah Pineda is a 68 y.o. 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 There is increase FDG uptake  in the area of soft tissue thickening adjacent to suture materials at the site of left upper lobe wedge  resection which demonstrate maximum SUV of 28.  Her most recent PFT on 11/12/19 showed?FEV1 of 85% predicted and DLCO of 93% predicted?Marland KitchenThe surgery team has evaluated the patient and thinks she is not a good candidate for surgery due to liver disease. Her recent INR is 1.1. She is scheduled for an EBUS biopsy this Friday. The patient has been doing well with out major concerns. She denies SOB, fever, chills, chest pain. Her energy level, weight and appetite are stable.   ?  She is a former smoker with 15 pack year.  She was also followed by Dr.,  Mindi Junker Rajabi?for her [facility]    She is here to review EBUS biopsy result and discuss treatment plan.      Review of Systems      Objective:         ? aspirin EC 81 mg tablet Take 81 mg by mouth daily after lunch. Take with food.    ? azaTHIOprine (IMURAN) 50 mg tablet TAKE 1 TABLET BY MOUTH AT BEDTIME   ? calcium carbonate/vitamin D2 (CALCIUM + VITAMIN D PO) Take  by mouth twice daily.   ? CONSTULOSE 10 gram/15 mL oral solution TAKE BY MOUTH TWO TO THREE TIMES DAILY (TITRATES TO 2-3 BOWEL MOVEMENTS DAILY)   ? furosemide (LASIX) 20 mg tablet Take one tablet by mouth every morning.   ? furosemide (LASIX) 20 mg tablet Take 20 mg by mouth every morning.   ? omeprazole DR (PRILOSEC) 20 mg capsule Take one capsule by mouth daily before breakfast.   ? oxyCODONE-acetaminophen (PERCOCET) 7.5-325 mg tablet Take 1 tablet by mouth every 12 hours as needed for Pain   ? rifAXIMin (XIFAXAN) 550 mg tablet Take one tablet by mouth every 12 hours.   ? spironolactone (ALDACTONE) 50 mg tablet Take one tablet by mouth daily with breakfast. Take with food.     There were no vitals filed for this visit.  There is no height or weight on file to calculate BMI.         Physical Exam    Path 12/05/2019    Final Diagnosis:   A. Touch Prep, LUL, EMN-FNA: ?   Non-small cell carcinoma, favor squamous cell carcinoma, see comment.   Please also see concurrent surgical pathology report ([id number]).     B. Lymph Node, 10R, EBUS-FNA: ?   No lymphoid tissue or lesional tissue identified. ?     C. Lymph Node, 11L, EBUS-FNA:   Scant lymphocytes and anthracotic pigment-laden macrophages.   No carcinoma  identified in the sampled specimen.      Assessment and Plan:  1. Mediastinal nodes are negative for malignance.  2. Recommend to treat two nodules in left upper lobe lung with SBRT. Simulation will be scheduled.  3. She will see Dr. Chipper Herb to discuss the benefit of systemic therapy.                        15 minutes spent on this patient's encounter with counseling and coordination of care taking >50% of the visit.

## 2019-12-12 NOTE — Progress Notes
Treatment Planning Note    Encounter Date: 12/12/2019 at 5:02 PM  Creation Date: 12/12/2019     Sarah Pineda is a 69 y.o. female patient that presents with Non-small cell lung cancer, unspecified laterality ([place]) [C34.90].      The patient will be treated with:     TX Technique: SBRT     Laterality: Left      Specific Area Treatment Site: lung    Number of Treatments: 5      Medical Imaging will be utilized to help ensure accuracy of positioning and field configuration.     I have personally formulated the patients clinical treatment plan.  A finalized treatment plan will be formulated, approved, and available in the patient's chart post simulation and pretreatment.     Special Treatment Required: No    Concurrent Chemo: No    There are no questions and answers to display.       This document is electronically approved by Rhae Lerner, MD.

## 2019-12-14 NOTE — Progress Notes
[facility]      PATIENT'S NAME: Sarah Pineda        DATE OF BIRTH / AGE: 1950/05/28 / 69 y.o.  MEDICAL RECORD NUMBER: [id number]  ENCOUNTER DATE: 12/15/2019      DIAGNOSIS: LUL sqNSCLC with sarcomatoid feature, 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, pT1bNx; now recurrent with clinical T3 disease. PD-L1 5-10%.      CHIEF COMPLAINT: Regular follow up.      CURRENT TREATMENT: Pending SBRT to start      PAST TREATMENT:  - 07/16/17: LUL wedge resection      ONCOLOGY HISTORY:  - Smoking hx: smoked 30 years, 0.5 pack per day on average. quitted for 3 years.  - 07/16/17: Left video assisted thoracoscopy left upper lobe wedge resection by Dr. Derenda Fennel. 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  - 10/22/19: CT?C/A/P: Development of 11 mm irregular pulmonary nodule in the left upper lobe suggesting recurrent pulmonary malignancy or metastasis.  - 10/29/19: PET: LUL nodule max SUV 9.5. 1. Post surgical changes of prior left upper lobe wedge resection with increased uptake about the resection margin, concerning for residual or   recurrent tumor. 2. New pulmonary nodule in the left upper lobe noted on CT of 10/22/2019 demonstrates significant increase FDG uptake, concerning for pulmonary metastasis.   - 11/12/19: PFT showed: FEV1 of 85% predicted and DLCO of 93% predicted   - 12/05/19: EBUS showed negative for mediastinal lymphadenopathy. However previous surgical site is positive for squamous carcinoma. PD-L1 5-10%.       INTERVAL HISTORY: Doing well. Breathing is stable.      PAST MEDICAL HISTORY:  Medical History:   Diagnosis Date   ? Cancer (HCC)     bone/ right leg   ? Diverticulosis    ? Emphysema lung (HCC)    ? GERD (gastroesophageal reflux disease)     Tums PRN   ? Hepatitis    ? Hepatocellular carcinoma (HCC)    ? Immune disorder (HCC)    ? Nonalcoholic steatohepatitis    ? Pulmonary nodule, left    ? 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 Squamous cell cancer of lip          PAST SURGICAL HISTORY:  Surgical History:   Procedure Laterality Date   ? AMPUTATION  1970    right leg-bone cancer   ? HX TUBAL LIGATION  1977   ? HX CHOLECYSTECTOMY  2004   ? ESOPHAGOGASTRODUODENOSCOPY WITH BIOPSY - FLEXIBLE N/A 05/18/2017    Performed by Virgina Organ, MD at [facility] ENDO   ? COLONOSCOPY DIAGNOSTIC WITH SPECIMEN COLLECTION BY BRUSHING/ WASHING - FLEXIBLE N/A 05/18/2017    Performed by Virgina Organ, MD at [facility] ENDO   ? LUNG SURGERY  07/24/2017    removal of lung wedge   ? left video assisted thoracoscopy with wedge resection Left 07/24/2017    Performed by Bryson Dames, MD at [facility] CVOR   ? ESOPHAGOGASTRODUODENOSCOPY WITH SPECIMEN COLLECTION BY BRUSHING/ WASHING N/A 05/23/2019    Performed by Dawna Part, MD at [facility] ENDO   ? BRONCHOSCOPY DIAGNOSTIC WITH/ WITHOUT CELL WASHING - FLEXIBLE N/A 12/05/2019    Performed by Boykin Nearing, Sherryl Manges, MD at [facility] OR   ? BRONCHOSCOPY WITH IMAGE - GUIDED NAVIGATION - FLEXIBLE N/A 12/05/2019    Performed by Boykin Nearing, Sherryl Manges, MD at [facility] OR   ?  BRONCHOSCOPY WITH ENDOBRONCHIAL ULTRASOUND GUIDED TRANSTRACHEAL/ TRANSBRONCHIAL SAMPLING - 3 OR MORE MEDIASTINAL/ HILAR LYMPH NODE STATIONS/ STRUCTURE - FLEXIBLE N/A 12/05/2019    Performed by Boykin Nearing, Maykol Elvera Lennox, MD at [facility] OR   ? BRONCHOSCOPY WITH TRANSBRONCHIAL LUNG BIOPSY - FLEXIBLE - SINGLE LOBE N/A 12/05/2019    Performed by Boykin Nearing, Sherryl Manges, MD at [facility] OR   ? BRONCHOSCOPY WITH TRANSBRONCHIAL NEEDLE ASPIRATION AND BIOPSY TRACHEA/ MAIN STEM/ LOBAR BRONCHUS - FLEXIBLE N/A 12/05/2019    Performed by Boykin Nearing, Maykol Elvera Lennox, MD at [facility] OR   ? BRONCHOSCOPY WITH BRUSHING/ PROTECTED BRUSHING - FLEXIBLE N/A 12/05/2019    Performed by Boykin Nearing, Sherryl Manges, MD at [facility] OR   ? LIVER SURGERY  06/2017, 07/2017    x 2 Ablasion    ? LYMPH NODE BIOPSY      back   ? SKIN CANCER EXCISION      sqc- lip and nose         FAMILY HISTORY:  Family History   Problem Relation Age of Onset   ? Heart Failure Mother    ? Cancer Mother         Stomach   ? Heart Failure Father         bypass   ? Heart Disease Father    ? Stroke Brother         age 80   ? Cancer-Colon Brother    ? Aneurysm Brother    ? Diabetes Brother    ? Abnormal EKG Brother         Afib   ? Cancer Sister         Pancreatic   ? Heart Disease Maternal Grandfather    ? Melanoma Neg Hx           SOCIAL HISTORY:  Social History     Socioeconomic History   ? Marital status: Married     Spouse name: Not on file   ? Number of children: Not on file   ? Years of education: Not on file   ? Highest education level: Not on file   Occupational History   ? Not on file   Tobacco Use   ? Smoking status: Former Smoker     Packs/day: 0.50     Years: 50.00     Pack years: 25.00     Types: Cigarettes     Quit date: 03/10/2017     Years since quitting: 2.7   ? Smokeless tobacco: Never Used   Vaping Use   ? Vaping Use: Never used   Substance and Sexual Activity   ? Alcohol use: Not Currently     Comment: couple times/year   ? Drug use: Never   ? Sexual activity: Not on file   Other Topics Concern   ? Not on file   Social History Narrative   ? Not on file          ALLERGY:   Allergies   Allergen Reactions   ? Ciprofloxacin BLISTERS   ? Latex RASH and REDNESS     Pt states it tore up her skin          MEDICATION:  Current Outpatient Medications on File Prior to Visit   Medication Sig Dispense Refill   ? aspirin EC 81 mg tablet Take 81 mg by mouth daily after lunch. Take with food.      ? azaTHIOprine (IMURAN) 50 mg tablet TAKE 1 TABLET BY MOUTH AT  BEDTIME 90 tablet 3   ? calcium carbonate/vitamin D2 (CALCIUM + VITAMIN D PO) Take  by mouth twice daily.     ? CONSTULOSE 10 gram/15 mL oral solution TAKE BY MOUTH TWO TO THREE TIMES DAILY (TITRATES TO 2-3 BOWEL MOVEMENTS DAILY) 1892 mL 11   ? furosemide (LASIX) 20 mg tablet Take one tablet by mouth every morning. 90 tablet 3   ? furosemide (LASIX) 20 mg tablet Take 20 mg by mouth every morning.     ? omeprazole DR (PRILOSEC) 20 mg capsule Take one capsule by mouth daily before breakfast. 90 capsule 3   ? oxyCODONE-acetaminophen (PERCOCET) 7.5-325 mg tablet Take 1 tablet by mouth every 12 hours as needed for Pain     ? rifAXIMin (XIFAXAN) 550 mg tablet Take one tablet by mouth every 12 hours. 60 tablet 11   ? spironolactone (ALDACTONE) 50 mg tablet Take one tablet by mouth daily with breakfast. Take with food. 30 tablet 5     No current facility-administered medications on file prior to visit.          REVIEW OF SYSTEMS:    Constitutional: No weight loss. No fever or chills  Eyes: No red eyes. No itchy eyes. No double vision. No change on vision acuity    Ear, Nose, Mouth & Throat: No hearing loss. No discharge. No sore throat   Cardiovascular: No chest pain. No palpitations. No syncope  Respiratory: + mild shortness of breath with exertion. + Occasional cough  Gastrointestinal: No nausea or vomiting. No difficulty swallowing. No abdominal pain. No diarrhea or constipation. No heartburn, throat pain or difficulty swallowing.  Genitourinary: No burning urination. No change in the color of urine  Musculoskeletal: No joints pain. No swelling of the upper or lower extremities    Integumentary: No rash.    Neuro:  No headache. No focal weakness or numbness in the upper or lower extremities  Endocrine: No heat or cold intolerance. No excessive sweating  All other systems are negative.       PHYSICAL EXAMINATION:  Vitals:    12/15/19 1044   BP: 127/68   Pulse: 83   Resp: 16   Temp: 36.9 ?C (98.4 ?F)   SpO2: 98%         ECOG performance status: 1    Constitutional: Alert and oriented x3.    Eyes: Pupils are equal bilaterally. No redness. No jaundice.   Ear, Nose, Mouth & Throat: Ear canals are patent. No discharge from the nose. Mucous membranes are moist  Cardiovascular: Regular rate and rhythm.    Respiratory: Good air entry bilaterally. No crackles or wheezes.  Gastrointestinal: Soft. Nontender. Nondistended. Good bowel sounds. No hepatosplenomegaly    Musculoskeletal & Extremities: No limitation in the joints' range of motion. No lower extremity edema  Neuro:  CN II-XII intact. Strength in the upper and lower extremities: intact. Sensations in the upper and lower extremities: intact    Integumentary: No rash.    Heme/Lymph/Immunology: No cervical, supraclavicular, axillary or inguinal lymphadenopathy      LABS: Reviewed.      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  - Patient is seen by thoracic surgeon Dr. Derenda Fennel and considered not an ideal surgical candidate.  - PET/CT showed increased uptake about the  resection margin, which is concerning for residual or recurrent tumor. Case was discussed in the Tumor Board. An EBUS was performed which showed N0, but previous surgical site was positive for squamous carcinoma. Therefore this can be justified for T3 disease and making adjuvant chemotherapy reasonable after SBRT. However I did tell patient that this is an extrapolation of data from patients who got complete surgical resection, and the 5 year survival benefit is marginal. I will also have a discussion with [name] for the appropriateness of adjuvant chemotherapy due to her liver disease.  - Patient will be evaluated by our Rad/Onc colleagues for consideration of SBRT.  - Tentatively RTC in 4 weeks for follow up.      Above A/P were explained in detail to the patient and family who expressed understanding. All the relevant questions were addressed appropriately. Patient and family understand in case of fever (>100.4), worsening chest pain or shortness of breath, or any other new symptoms, our on-call physician can be reached and 911 shall be called in case of emergencies. ---  Willis Modena, MD, PhD  Associate Professor  Division of Medical Oncology  Department of Internal Medicine  [place]    CC:  Bryson Dames, MD  Raed Tamsen Meek, MD  Jason Nest, MD

## 2019-12-15 ENCOUNTER — Encounter: Admit: 2019-12-15 | Discharge: 2019-12-15 | Payer: MEDICARE

## 2019-12-15 DIAGNOSIS — C220 Liver cell carcinoma: Secondary | ICD-10-CM

## 2019-12-15 DIAGNOSIS — K7581 Nonalcoholic steatohepatitis (NASH): Secondary | ICD-10-CM

## 2019-12-15 DIAGNOSIS — C3492 Malignant neoplasm of unspecified part of left bronchus or lung: Secondary | ICD-10-CM

## 2019-12-15 DIAGNOSIS — D899 Disorder involving the immune mechanism, unspecified: Secondary | ICD-10-CM

## 2019-12-15 DIAGNOSIS — K5790 Diverticulosis of intestine, part unspecified, without perforation or abscess without bleeding: Secondary | ICD-10-CM

## 2019-12-15 DIAGNOSIS — C801 Malignant (primary) neoplasm, unspecified: Secondary | ICD-10-CM

## 2019-12-15 DIAGNOSIS — R911 Solitary pulmonary nodule: Secondary | ICD-10-CM

## 2019-12-15 DIAGNOSIS — C499 Malignant neoplasm of connective and soft tissue, unspecified: Secondary | ICD-10-CM

## 2019-12-15 DIAGNOSIS — K219 Gastro-esophageal reflux disease without esophagitis: Secondary | ICD-10-CM

## 2019-12-15 DIAGNOSIS — C4402 Squamous cell carcinoma of skin of lip: Secondary | ICD-10-CM

## 2019-12-15 DIAGNOSIS — K759 Inflammatory liver disease, unspecified: Secondary | ICD-10-CM

## 2019-12-15 DIAGNOSIS — J439 Emphysema, unspecified: Secondary | ICD-10-CM

## 2019-12-15 DIAGNOSIS — C3490 Malignant neoplasm of unspecified part of unspecified bronchus or lung: Secondary | ICD-10-CM

## 2019-12-15 LAB — COMPREHENSIVE METABOLIC PANEL
Lab: 0.6 mg/dL — ABNORMAL HIGH (ref 0.3–1.2)
Lab: 1 mg/dL — ABNORMAL HIGH (ref 0.4–1.00)
Lab: 101 U/L (ref 25–110)
Lab: 13 mg/dL (ref 7–25)
Lab: 135 MMOL/L — ABNORMAL LOW (ref 137–147)
Lab: 17 U/L (ref 7–40)
Lab: 30 MMOL/L (ref 21–30)
Lab: 4.1 g/dL — ABNORMAL LOW (ref 3.5–5.0)
Lab: 4.7 MMOL/L (ref 3.5–5.1)
Lab: 55 mL/min — ABNORMAL LOW (ref >60)
Lab: 7 K/UL — ABNORMAL LOW (ref 3–12)
Lab: 7.2 g/dL (ref 6.0–8.0)
Lab: 9 U/L (ref 7–56)
Lab: 9.9 mg/dL (ref 8.5–10.6)
Lab: 95 mg/dL (ref 70–100)
Lab: 98 MMOL/L (ref 98–110)

## 2019-12-15 LAB — CBC AND DIFF
Lab: 4 M/UL (ref 4.0–5.0)
Lab: 5.8 K/UL (ref 4.5–11.0)

## 2019-12-17 ENCOUNTER — Encounter: Admit: 2019-12-17 | Discharge: 2019-12-17 | Payer: MEDICARE

## 2019-12-24 ENCOUNTER — Ambulatory Visit: Admit: 2019-12-24 | Discharge: 2019-12-24 | Payer: MEDICARE

## 2019-12-28 ENCOUNTER — Encounter: Admit: 2019-12-28 | Discharge: 2019-12-29 | Payer: MEDICARE

## 2019-12-29 DIAGNOSIS — Z20822 Contact with and (suspected) exposure to covid-19: Secondary | ICD-10-CM

## 2019-12-30 ENCOUNTER — Encounter: Admit: 2019-12-30 | Discharge: 2019-12-30 | Payer: MEDICARE

## 2020-01-01 ENCOUNTER — Encounter: Admit: 2020-01-01 | Discharge: 2020-01-01 | Payer: MEDICARE

## 2020-01-05 ENCOUNTER — Encounter: Admit: 2020-01-05 | Discharge: 2020-01-05 | Payer: MEDICARE

## 2020-01-07 ENCOUNTER — Encounter: Admit: 2020-01-07 | Discharge: 2020-01-07 | Payer: MEDICARE

## 2020-01-08 ENCOUNTER — Ambulatory Visit: Admit: 2020-01-08 | Discharge: 2020-01-09 | Payer: MEDICARE

## 2020-01-09 ENCOUNTER — Encounter: Admit: 2020-01-09 | Discharge: 2020-01-09 | Payer: MEDICARE

## 2020-01-09 DIAGNOSIS — C3492 Malignant neoplasm of unspecified part of left bronchus or lung: Secondary | ICD-10-CM

## 2020-01-09 NOTE — Progress Notes
End of Treatment Summary Note    Date:  01/09/2020    Sarah Pineda is a 69 y.o. female.     The encounter diagnosis was Non-small cell cancer of left lung ([place]).  Staging: Cancer Staging  Hepatocellular carcinoma ([place])  Staging form: Liver, AJCC 8th Edition  - Clinical: cT1, cN0, cM0 - Signed by Lenise Arena, MD on 06/06/2017    Non-small cell lung cancer ([place])  Staging form: Lung, AJCC 8th Edition  - Pathologic: Stage IA2 (pT1b, pN0, cM0) - Signed by Lenise Arena, MD on 08/03/2017  - Pathologic: No stage assigned - Unsigned      Treatment Data Summary:   SBRT: Stereotactic Body Radiation Therapy   Treatment Data 12/30/2019 01/01/2020 01/05/2020 01/07/2020 01/09/2020   Course ID C1 SBRT LT Lung C1 SBRT LT Lung C1 SBRT LT Lung C1 SBRT LT Lung C1 SBRT LT Lung   Plan ID SBRT Lt Lung SBRT Lt Lung SBRT Lt Lung SBRT Lt Lung SBRT Lt Lung   Prescription Dose (cGy) 5,000 5,000 5,000 5,000 5,000   Prescribed Dose per Fraction (Gy) 10 10 10 10 10    Fractions Treated to Date 1 2 3 4 5    Total Fractions on Plan 5 5 5 5 5    Treatment Elapsed Days 0 2 6 8 10    Reference Point ID SBRT Lt Lung SBRT Lt Lung SBRT Lt Lung SBRT Lt Lung SBRT Lt Lung   Dosage Given to Date 10 20 30  40 50   Some recent data might be hidden        Site of treatment: left lung  Date of treatment: 12/7/-17/2021  Dose /fraction: 5000 cGy in 5 fractions  Technique of radiation: SBRT, 6 xsrs photon     Treatment Tolerance and Response:  The patient tolerated treatment well without unexpected or significant acute side effects.        Follow Up:  Sarah Pineda will be scheduled for a follow up with CT in three months.Marland Kitchen

## 2020-01-22 IMAGING — CR CHEST
2 series · 2 of 2 positions shown · non-contrast
Comparison: none

[chest pa]
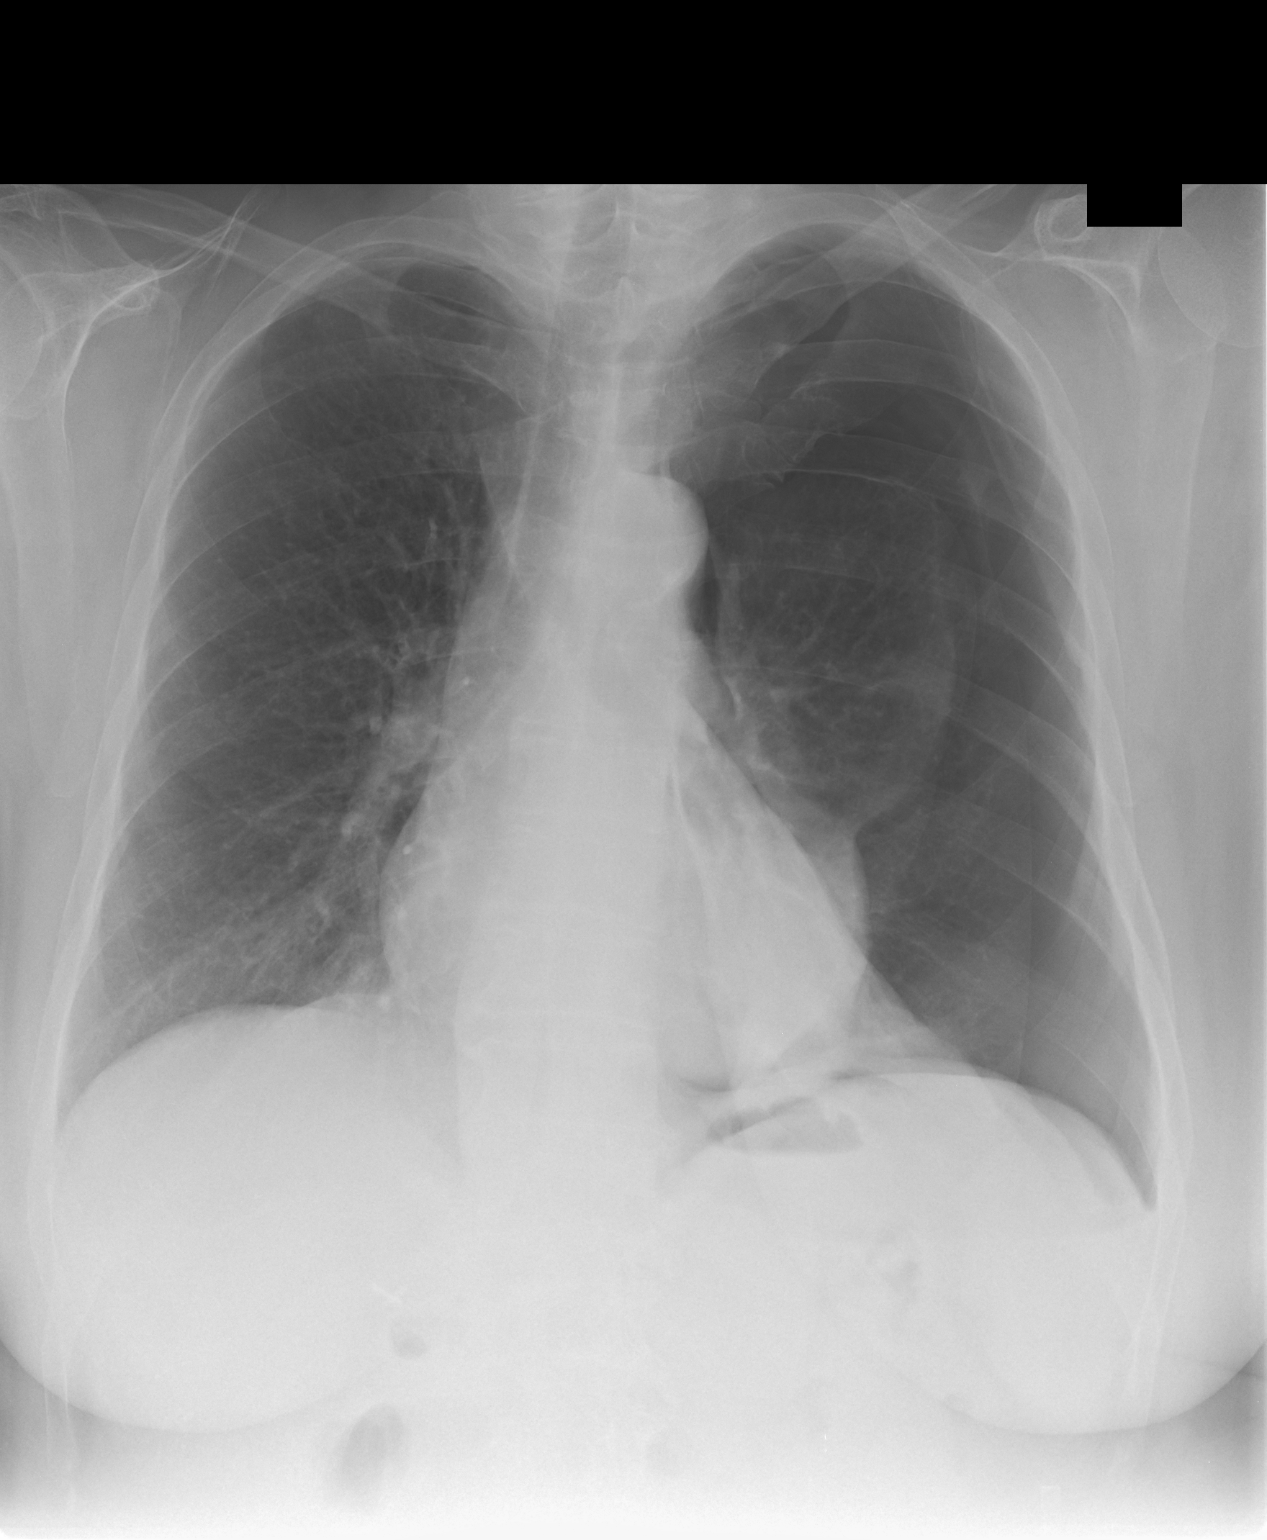

[chest lat]
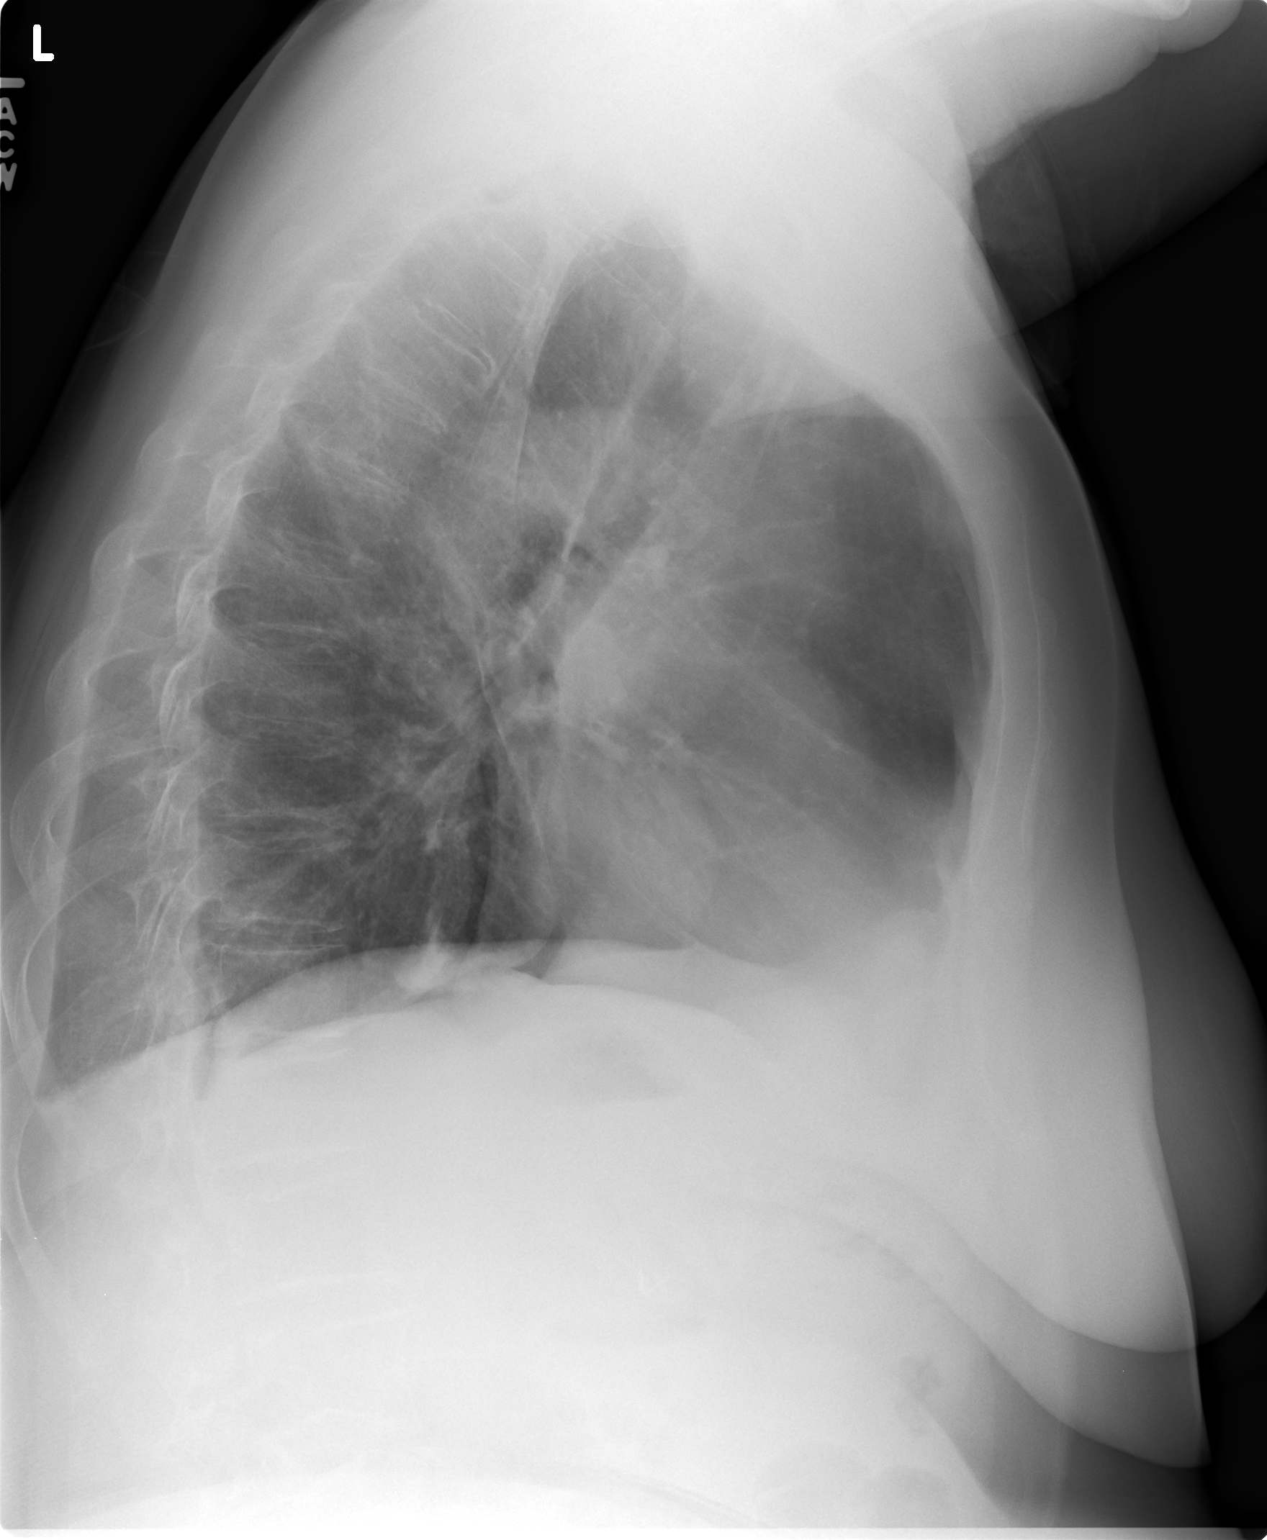

[2 of 2 positions shown; findings below may reference images not displayed]

EXAM
Chest x-ray.

INDICATION
wheezing
PT had lung bx last week and has soa. AW

FINDINGS
Two views of the chest were obtained.
There is a moderately large left pneumothorax which measures approximate 3 centimeters in width
laterally and inferiorly and over 5 centimeters in width at the left apex. The patient is slightly
rotated. The right lung is clear.

IMPRESSION
There is a moderately large left pneumothorax. Findings were discussed with the emergency room
physician at [DATE] p.m.

Tech Notes:

PT had lung bx last week and has soa. AW

## 2020-02-04 ENCOUNTER — Encounter: Admit: 2020-02-04 | Discharge: 2020-02-04 | Payer: MEDICARE

## 2020-02-04 MED ORDER — SPIRONOLACTONE 50 MG PO TAB
50 mg | ORAL_TABLET | Freq: Every day | ORAL | 5 refills | 90.00000 days | Status: AC
Start: 2020-02-04 — End: ?

## 2020-02-24 ENCOUNTER — Encounter: Admit: 2020-02-24 | Discharge: 2020-02-24 | Payer: MEDICARE

## 2020-02-24 DIAGNOSIS — C3412 Malignant neoplasm of upper lobe, left bronchus or lung: Secondary | ICD-10-CM

## 2020-02-24 DIAGNOSIS — C220 Liver cell carcinoma: Secondary | ICD-10-CM

## 2020-02-24 DIAGNOSIS — K754 Autoimmune hepatitis: Secondary | ICD-10-CM

## 2020-02-24 LAB — COMPREHENSIVE METABOLIC PANEL
Lab: 0.5 mg/dL (ref 0.3–1.2)
Lab: 1 mg/dL — ABNORMAL HIGH (ref 0.4–1.00)
Lab: 133 MMOL/L — ABNORMAL LOW (ref 137–147)
Lab: 14 U/L (ref 7–56)
Lab: 14 mg/dL (ref 7–25)
Lab: 26 MMOL/L (ref 21–30)
Lab: 30 U/L (ref 7–40)
Lab: 4 MMOL/L — ABNORMAL LOW (ref 3.5–5.1)
Lab: 4.1 g/dL — ABNORMAL LOW (ref 3.5–5.0)
Lab: 60 mL/min — ABNORMAL LOW (ref >60)
Lab: 7.5 g/dL (ref 6.0–8.0)
Lab: 8 K/UL — ABNORMAL LOW (ref 3–12)
Lab: 89 U/L — ABNORMAL HIGH (ref 25–110)
Lab: 9.3 mg/dL — ABNORMAL LOW (ref 8.5–10.6)

## 2020-02-24 LAB — CBC AND DIFF
Lab: 0 K/UL (ref 0–0.20)
Lab: 0 K/UL (ref 0–0.45)
Lab: 3.1 K/UL — ABNORMAL LOW (ref 4.5–11.0)
Lab: 3.7 M/UL — ABNORMAL LOW (ref 4.0–5.0)

## 2020-02-24 LAB — ALPHA FETO PROTEIN (AFP): Lab: 2 ng/mL (ref 0.0–15.0)

## 2020-02-24 LAB — PROTIME INR (PT)
Lab: 1.1 mg/dL (ref 0.8–1.2)
Lab: 12 s (ref 8.5–14.4)

## 2020-02-24 LAB — POC CREATININE, RAD: Lab: 0.9 mg/dL (ref >60)

## 2020-02-24 MED ORDER — IOHEXOL 350 MG IODINE/ML IV SOLN
100 mL | Freq: Once | INTRAVENOUS | 0 refills | Status: CP
Start: 2020-02-24 — End: ?

## 2020-02-24 MED ORDER — SODIUM CHLORIDE 0.9 % IJ SOLN
50 mL | Freq: Once | INTRAVENOUS | 0 refills | Status: CP
Start: 2020-02-24 — End: ?

## 2020-02-24 NOTE — Patient Instructions
Dr.Raed Al-Rajabi, MD Medical Oncologist specializing in Gastro-Intestinal malignancies   Molly Silver, APRN Nurse Practitioner   Lauren Barker, RN Morton Simson, RN    Nurse Phone: [phone]-Monday-Friday 8:00am- 4:00pm   After Hours Phone: [phone]- On call number for urgent needs outside of business hours ask for the On call Oncology fellow to be paged and they will call you back  Scheduling: [phone]- call with any scheduling or appointment questions   Medication refills: please contact your pharmacy for medication refills, if they do not have any refills on file they will contact the office, make sure they have our correct contact information on file P: [phone], F: [phone]  MyChart Messages: All mychart messages are answered by the nurses, even if you select to send the message to Dr. Al-Rajabi or Molly. We often communicate with Dr. Al-Rajabi or Molly regarding how to answer these messages. Messages are answered 8:00am-4:00pm Monday-Friday.  Phone Calls: We are typically not at our desks where our phones are located as we are in clinic. Please leave us a message as we check our messages several times per day. It is our goal to answer these messages as soon as possible. Messages are answered from 8:00am-4:00 pm Monday-Friday. Please make sure you leave your full name with the spelling, date of birth, and reason for your call when leaving a message.

## 2020-02-24 NOTE — Progress Notes
Name: Sarah Pineda          MRN: [id number]      DOB: [birth year]      AGE: 70 y.o.   DATE OF SERVICE: 02/24/2020    Subjective:             Reason for Visit:  No chief complaint on file.      Sarah Pineda is a 70 y.o. female.     Cancer Staging  Hepatocellular carcinoma (HCC)  Staging form: Liver, AJCC 8th Edition  - Clinical: cT1, cN0, cM0 - Signed by Dayton Bailiff, MD on 06/06/2017    Non-small cell lung cancer (HCC)  Staging form: Lung, AJCC 8th Edition  - Pathologic: Stage IA2 (pT1b, pN0, cM0) - Signed by Dayton Bailiff, MD on 08/03/2017  - Pathologic: No stage assigned - Unsigned        History of Present Illness     [name] doing really well.  She completed radiation therapy without any ill effects no shortness of breath or cough no fevers or chills no abdominal pain.  Denies any nausea vomiting denies any diarrhea.  Denies any abdominal pain.  Denies any ascites.  Has follow-up with radiation oncology and thoracic oncology in March.  Here for follow-up of her liver cancer.           Review of Systems   Constitutional: Negative for fatigue, fever and unexpected weight change.   HENT: Negative for facial swelling, mouth sores, sore throat, tinnitus, trouble swallowing and voice change.    Respiratory: Negative for cough, choking, chest tightness, shortness of breath and wheezing.    Cardiovascular: Negative for chest pain, palpitations and leg swelling.   Gastrointestinal: Negative for abdominal distention, abdominal pain, anal bleeding, blood in stool, constipation, diarrhea, nausea, rectal pain and vomiting.   Genitourinary: Negative for difficulty urinating, dysuria, frequency and hematuria.   Musculoskeletal: Negative for arthralgias, back pain, gait problem, joint swelling, myalgias and neck pain.   Skin: Negative for pallor, rash and wound.   Neurological: Negative for dizziness, seizures, syncope, speech difficulty, weakness, light-headedness, numbness and headaches.   Hematological: Negative for adenopathy. Does not bruise/bleed easily.   Psychiatric/Behavioral: Negative for dysphoric mood and sleep disturbance. The patient is not nervous/anxious.          Objective:         ? aspirin EC 81 mg tablet Take 81 mg by mouth daily after lunch. Take with food.    ? azaTHIOprine (IMURAN) 50 mg tablet TAKE 1 TABLET BY MOUTH AT BEDTIME   ? calcium carbonate/vitamin D2 (CALCIUM + VITAMIN D PO) Take  by mouth twice daily.   ? CONSTULOSE 10 gram/15 mL oral solution TAKE BY MOUTH TWO TO THREE TIMES DAILY (TITRATES TO 2-3 BOWEL MOVEMENTS DAILY)   ? furosemide (LASIX) 20 mg tablet Take one tablet by mouth every morning.   ? furosemide (LASIX) 20 mg tablet Take 20 mg by mouth every morning.   ? omeprazole DR (PRILOSEC) 20 mg capsule Take one capsule by mouth daily before breakfast.   ? oxyCODONE-acetaminophen (PERCOCET) 7.5-325 mg tablet Take 1 tablet by mouth every 12 hours as needed for Pain   ? rifAXIMin (XIFAXAN) 550 mg tablet Take one tablet by mouth every 12 hours. (Patient taking differently: Take  by mouth every 12 hours. 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 spironolactone (ALDACTONE) 50 mg tablet Take one tablet by mouth daily with breakfast. Take with food.  Vitals:    02/24/20 1105 02/24/20 1108   BP:  115/83   BP Source:  Arm, Left Upper   Pulse:  99   Resp:  16   Temp:  36.3 ?C (97.3 ?F)   TempSrc:  Temporal   SpO2:  97%   Weight:  78.4 kg (172 lb 12.8 oz)   Height:  167.6 cm (66)   PainSc: Zero      Body mass index is 27.89 kg/m?Marland Kitchen     Pain Score: Zero       Fatigue Scale: 0-None    Pain Addressed:  N/A    Patient Evaluated for a Clinical Trial: No treatment clinical trial available for this patient.     [place] Cooperative Oncology Group performance status is 1, Restricted in physically strenuous activity but ambulatory and able to carry out work of a light or sedentary nature, e.g., light house work, office work.     Physical Exam  Constitutional:       General: She is not in acute distress.     Appearance: Normal appearance. She is well-developed.   HENT:      Head: Normocephalic and atraumatic.   Eyes:      General: No scleral icterus.        Right eye: No discharge.      Pupils: Pupils are equal, round, and reactive to light.   Neck:      Thyroid: No thyromegaly.   Cardiovascular:      Rate and Rhythm: Normal rate and regular rhythm.      Heart sounds: Normal heart sounds. No murmur heard.   No friction rub.   Pulmonary:      Effort: No respiratory distress.      Breath sounds: Normal breath sounds. No wheezing or rales.   Abdominal:      General: Bowel sounds are normal. There is no distension.      Palpations: Abdomen is soft. There is no hepatomegaly, splenomegaly or mass.      Tenderness: There is no abdominal tenderness. There is no guarding or rebound.      Hernia: No hernia is present.   Musculoskeletal:         General: No tenderness.      Cervical back: Full passive range of motion without pain, normal range of motion and neck supple.      Comments: Right lower limb amputation   Lymphadenopathy:      Head:      Right side of head: No submental, submandibular, tonsillar, preauricular, posterior auricular or occipital adenopathy.      Left side of head: No submental, submandibular, tonsillar, preauricular, posterior auricular or occipital adenopathy.      Cervical: No cervical adenopathy.      Upper Body:      Right upper body: No supraclavicular adenopathy.      Left upper body: No supraclavicular adenopathy.   Skin:     General: Skin is warm.      Findings: No petechiae or rash. Rash is not purpuric.   Neurological:      Mental Status: She is alert and oriented to person, place, and time.      Cranial Nerves: No cranial nerve deficit.      Sensory: No sensory deficit.      Coordination: Coordination normal.      Deep Tendon Reflexes: Reflexes are normal and symmetric.   Psychiatric:         Mood and Affect: Mood  is not anxious or depressed.         Behavior: Behavior normal.         Thought Content: Thought content normal.         Judgment: Judgment normal.                   Assessment and Plan:  Problem List        Oncology    Hepatocellular carcinoma [facility])    Overview       ?  She was admitted to the hospital in 02/2017 with decompensated liver failure. Her MELD at that time was 27. She is currently still on prednisone taper with plans to start azathioprine soon  ?  ?  ?  03/14/17 - AFP = 22.4 and 05/09/17 = 8.3  ?  03/15/17 - MRI ABD impression Redemonstration of 1.7 cm LI RADS 3 observation in segment 5/6 of the liver with arterial enhancement. No appreciable washout or additional significant signal abnormality.  Heterogeneous perfusion pattern throughout the liver without additional discrete mass. Follow-up CT or MRI abdomen (liver protocol) in 3 months recommended. Cirrhosis with small portosystemic varices. No splenomegaly or ascites. ?  ?  03/15/17 Liver biopsy  SURG PATH #: [id number]  A.  Chronic hepatitis with moderate to severe interface and lobular activity with lymphocytic and lympho- plasmacytic infiltrates in the portal tracts and extending into the lobule and areas of confluent hepatocyte necrosis/collapse, compatible with ? ? autoimmune hepatitis. ?   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   See comment.   ?  03/29/17 - Liver biopsy  SURG PATH #: [id number]  A. Liver, mass, biopsy: ? Negative for mass/lesion.  ?  4//8/19 Liver biopsy SURG PATH #: [id number] A. Liver mass, biopsy: Well differentiated hepatocellular carcinoma. ? ?   ?  05/30/17 mri pelvis IMPRESSION ?Three soft tissue nodules within the right pelvis that are unchanged since February 2019. ?These most likely represent metastases from the reported sarcoma. ?Consider biopsy of the largest nodule, as clinically indicated.      ?05/31/17 US Guided bx of the target right gluteal soft tissue nodule  ?Successful ultrasound guided biopsy of the target right gluteal soft tissue mass. ?Four core biopsy specimens submitted to pathology in formalin  ?  05/31/17 PATHOLOGY SURG PATH #: [id number] Diagnosis:  A. Nerve bundles, pelvic lymph node biopsy, core needle biopsy: ? Abundant nerve bundles. See comment.        She is to also have MWA along with TACE for LI-Rads 5 lesion -   08/22/17 Successful CT -guided Microwave Ablation of hepatic segment 5/6 lesion  02/07/19 Successful CT -guided Microwave Ablation of segment 6 liver tumor.               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 Lung, left upper lobe wedge mass, wedge resection: ?   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                 Other    Autoimmune hepatitis (HCC)              CT abdomen and pelvis (06/18/2019):    IMPRESSION   1. ?Previously treated hepatic segment 5/6 lesion without residual arterial enhancement or washout. LI-RADS treated nonviable. No new   enhancing hepatic lesion identified.   2. ?Cirrhosis. Normal size spleen. No ascites.   3. ?Punctate  nonobstructing renal calculi.            1. Hepatocellular carcinoma biopsy proven.   - Status post her second chemoembolization tolerated that very well.    - We reviewed her labs and tumor marker which are unremarkable. AFP is 2.1.  - Reviewed her CT AP which is negative, except for stable cirrhosis, no ascites.   - Had EGD on 05/23/2019 which showed esophageal varices, small and non-bleeding, so no intervention needed.    Sarah Pineda is here for restaging scans.  We reviewed her images in clinic today the official report was still pending at the time of her visit but there is no evidence of enhancement or washout in her liver at this time and I recommended continued surveillance and we will see her back in 3 months.  Plan:  - We reviewed her labs and scans, stable. Normal AFP.  - RTC in 3 months.      2. Abnormal lung nodule of the left upper lobe suspicious for primary neoplasm of the lung .  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    Recently had a new pulmonary nodule that was found to have early stage squamous cell carcinoma of the lung possibly a second primary status post radiation therapy following up with Dr. Chipper Herb and Dr. Modesto Charon.    3. 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     4. Autoimmune hepatitis   - Following up with Dr. Ladona Ridgel well compensated.    Follow-up in 3 months with restaging scans of the abdomen we will leave her chest imaging to thoracic oncology.  Marland Kitchen

## 2020-03-15 NOTE — Progress Notes
PA for Xifaxan 550mg s PO BID approved via Cover My Meds.

## 2020-03-29 ENCOUNTER — Encounter: Admit: 2020-03-29 | Discharge: 2020-03-29 | Payer: MEDICARE

## 2020-03-29 DIAGNOSIS — K7581 Nonalcoholic steatohepatitis (NASH): Secondary | ICD-10-CM

## 2020-03-29 DIAGNOSIS — K219 Gastro-esophageal reflux disease without esophagitis: Secondary | ICD-10-CM

## 2020-03-29 MED ORDER — OMEPRAZOLE 20 MG PO CPDR
20 mg | ORAL_CAPSULE | Freq: Every day | ORAL | 3 refills | Status: AC
Start: 2020-03-29 — End: ?

## 2020-04-07 ENCOUNTER — Encounter: Admit: 2020-04-07 | Discharge: 2020-04-07 | Payer: MEDICARE

## 2020-04-07 DIAGNOSIS — C3492 Malignant neoplasm of unspecified part of left bronchus or lung: Secondary | ICD-10-CM

## 2020-04-07 LAB — POC CREATININE, RAD: Lab: 1 mg/dL (ref 0.4–1.00)

## 2020-04-07 MED ORDER — SODIUM CHLORIDE 0.9 % IJ SOLN
50 mL | Freq: Once | INTRAVENOUS | 0 refills | Status: CP
Start: 2020-04-07 — End: ?

## 2020-04-07 MED ORDER — IOHEXOL 350 MG IODINE/ML IV SOLN
70 mL | Freq: Once | INTRAVENOUS | 0 refills | Status: CP
Start: 2020-04-07 — End: ?

## 2020-04-13 ENCOUNTER — Encounter: Admit: 2020-04-13 | Discharge: 2020-04-13 | Payer: MEDICARE

## 2020-04-13 DIAGNOSIS — C3490 Malignant neoplasm of unspecified part of unspecified bronchus or lung: Secondary | ICD-10-CM

## 2020-04-15 ENCOUNTER — Encounter: Admit: 2020-04-15 | Discharge: 2020-04-15 | Payer: MEDICARE

## 2020-04-15 ENCOUNTER — Ambulatory Visit: Admit: 2020-04-15 | Discharge: 2020-04-15 | Payer: MEDICARE

## 2020-04-15 DIAGNOSIS — C3492 Malignant neoplasm of unspecified part of left bronchus or lung: Secondary | ICD-10-CM

## 2020-04-15 NOTE — Progress Notes
Telehealth Visit Note    Date of Service: 04/15/2020    Subjective:      Obtained patient's verbal consent to treat them and their agreement to [facility] financial policy and NPP via this telehealth visit during the [facility] Emergency       Sarah Pineda is a 70 y.o. female.    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    EBUS biopsy showed no mediastinal nodes involvement. T3 lesion in left lung.     She was treated with SBRT to 5000 cGy in 5 fractions, completed on 01/09/2020.    Return for a follow up with CT. She is doing well.        Review of Systems   All other systems reviewed and are negative.        Objective:         ? aspirin EC 81 mg tablet Take 81 mg by mouth daily after lunch. Take with food.    ? azaTHIOprine (IMURAN) 50 mg tablet TAKE 1 TABLET BY MOUTH AT BEDTIME   ? calcium carbonate/vitamin D2 (CALCIUM + VITAMIN D PO) Take  by mouth twice daily.   ? CONSTULOSE 10 gram/15 mL oral solution TAKE BY MOUTH TWO TO THREE TIMES DAILY (TITRATES TO 2-3 BOWEL MOVEMENTS DAILY)   ? furosemide (LASIX) 20 mg tablet Take one tablet by mouth every morning.   ? furosemide (LASIX) 20 mg tablet Take 20 mg by mouth every morning.   ? omeprazole DR (PRILOSEC) 20 mg capsule Take one capsule by mouth daily before breakfast.   ? oxyCODONE-acetaminophen (PERCOCET) 7.5-325 mg tablet Take 1 tablet by mouth every 12 hours as needed for Pain   ? rifAXIMin (XIFAXAN) 550 mg tablet Take one tablet by mouth every 12 hours. (Patient taking differently: Take  by mouth every 12 hours. 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 spironolactone (ALDACTONE) 50 mg tablet Take one tablet by mouth daily with breakfast. Take with food.           Telehealth Body Mass Index: 27.92 at 04/15/2020  1:05 PM    Physical Exam    CT 04/07/2020  1. Postsurgical changes of prior left wedge resection with decreased   nodularity along the resection margin and decrease in size of the left   upper lobe nodule most likely secondary to interval radiation therapy.     2. No thoracic lymphadenopathy. ?        Assessment and Plan:  1. CT showed response to SBRT. No metastasis  2. 6 months follow up with CT   3. Discussed CT finding with patient.                     15 minutes spent on this  patient's encounter with counseling and coordination of care taking >50% of the visit.

## 2020-04-16 ENCOUNTER — Encounter: Admit: 2020-04-16 | Discharge: 2020-04-16 | Payer: MEDICARE

## 2020-04-16 NOTE — Telephone Encounter
Daughter called in regards to her mother's appt on Monday. Per Dr. Leland Her recent visit and results, patient did not need to follow up? If the follow needs to happen, they are requesting a telehealth for Monday's appts. Dr. Delories Heinz team notified and will follow up with pt.

## 2020-04-19 ENCOUNTER — Encounter: Admit: 2020-04-19 | Discharge: 2020-04-19 | Payer: MEDICARE

## 2020-04-21 ENCOUNTER — Encounter: Admit: 2020-04-21 | Discharge: 2020-04-21 | Payer: MEDICARE

## 2020-04-21 DIAGNOSIS — C3412 Malignant neoplasm of upper lobe, left bronchus or lung: Secondary | ICD-10-CM

## 2020-04-21 NOTE — Telephone Encounter
Chemotherapy Education    Provided patient with written and verbal education regarding paclitaxel/carboplatin chemotherapy for the treatment of NSCLC.    Reviewed chemotherapy schedule.  Patient will receive chemotherapy in 21-day cycles as follows:  ? Paclitaxel IV over 3 hours  ? Carboplatin IV over 30 minutes  ? Growth factor may be administered 24-72 hours after chemotherapy administration if white blood cells do not recover well after first cycle of chemotherapy    Reviewed side effects of chemotherapy, including - but not limited to:  ? Low blood counts (explained associated risk for infection, bleeding, bruising, fatigue, weakness)   ? Nausea and vomiting (explained the purpose of scheduled antiemetics before chemotherapy and the use of PRNs in the event of breakthrough CINV)   o Take dexamethasone 4 mg (1 tablet) daily in the morning for 3 days starting the day after each chemotherapy infusion.  o Use ondansetron 8 mg (1 tablet) every 8 hours as needed for breakthrough nausea/vomiting.  ? Potential kidney toxicity and electrolyte abnormalities (important to maintain good oral hydration intake at home)   ? Liver abnormalities  ? Changes in lab tests (explained that the medical team will be monitoring lab values closely)   ? Hair loss   ? Potential peripheral neuropathy   ? Potential hypersensitivity (pre-medications may be de-escalated after the first two doses if no reaction to paclitaxel)  ? Changes in bowel habits / poor appetite / mouth sores & mucositis / taste changes  o Reviewed use of OTC docusate, senna, or polyethylene glycol for constipation. The patient is already on lactulose daily.  o Reviewed use of OTC loperamide for diarrhea.  o Reviewed use of a baking soda and salt water rinse to prevent mouth sores. Dissolve 1 teaspoon of baking soda and 1 teaspoon of salt in a glass of water and use solution to rinse and spit 3-4 times per day (after meals and at bedtime)  ? Rash and swelling  ? Joint/muscle aches and pains    Advised patient to contact clinic for any of the following scenarios or if any other questions/concerns arise:  ? Persistent uncontrolled nausea/vomiting uncontrolled by PRN antiemetics  ? Extreme fatigue  ? Inability to self-hydrate or eat for >24 hours  ? Other GI issues unresponsive to current medications  ? Uncontrolled pain  ? Any swelling, burning, pain, or redness at the infusion site      Educated patient to not start any new medications, vitamins, or herbals prior to contacting clinic.    Patient voiced understanding about the provided information. All questions/concerns addressed at this time. Medication handout(s) provided.    Elvin So, PHARMD

## 2020-04-22 ENCOUNTER — Encounter: Admit: 2020-04-22 | Discharge: 2020-04-22 | Payer: MEDICARE

## 2020-04-22 NOTE — Progress Notes
[facility]      PATIENT'S NAME: Sarah Pineda        DATE OF BIRTH / AGE: 1950-09-27 / 70 y.o.  MEDICAL RECORD NUMBER: [id number]  ENCOUNTER DATE: 04/26/2020      DIAGNOSIS: LUL sqNSCLC with sarcomatoid feature, 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, pT1bNx; now recurrent with clinical T3 disease. PD-L1 5-10%.      CHIEF COMPLAINT: Regular follow up.      CURRENT TREATMENT: s/p SBRT       PAST TREATMENT:  - 07/16/17: LUL wedge resection      ONCOLOGY HISTORY:  - Smoking hx: smoked 30 years, 0.5 pack per day on average. quitted for 3 years.  - 07/16/17: Left video assisted thoracoscopy left upper lobe wedge resection by Dr. Derenda Fennel. 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  - 10/22/19: CT?C/A/P: Development of 11 mm irregular pulmonary nodule in the left upper lobe suggesting recurrent pulmonary malignancy or metastasis.  - 10/29/19: PET: LUL nodule max SUV 9.5. 1. Post surgical changes of prior left upper lobe wedge resection with increased uptake about the resection margin, concerning for residual or   recurrent tumor. 2. New pulmonary nodule in the left upper lobe noted on CT of 10/22/2019 demonstrates significant increase FDG uptake, concerning for pulmonary metastasis.   - 11/12/19: PFT showed: FEV1 of 85% predicted and DLCO of 93% predicted   - 12/05/19: EBUS showed negative for mediastinal lymphadenopathy. However previous surgical site is positive for squamous carcinoma. PD-L1 5-10%.  - 01/08/21: s/p SBRT  - 01/26/20: Patient missed appointment with [place].  - 04/19/20: Telehealth to discuss adjuvant treatments.  - 04/26/20: Clinic visit with Med/Onc. Discussed adjuvant chemotherapy and immunotherapy in person. After weighing the benefits and risks, patient opted for NOT receiving these adjuvant treatments.       INTERVAL HISTORY: Doing well. Breathing is stable.      PAST MEDICAL HISTORY:  Medical History:   Diagnosis Date   ? Cancer (HCC)     bone/ right leg   ? Diverticulosis    ? Emphysema lung (HCC)    ? GERD (gastroesophageal reflux disease)     Tums PRN   ? Hepatitis    ? Hepatocellular carcinoma (HCC)    ? Immune disorder (HCC)    ? Nonalcoholic steatohepatitis    ? Pulmonary nodule, left    ? 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 Squamous cell cancer of lip          PAST SURGICAL HISTORY:  Surgical History:   Procedure Laterality Date   ? AMPUTATION  1970    right leg-bone cancer   ? HX TUBAL LIGATION  1977   ? HX CHOLECYSTECTOMY  2004   ? ESOPHAGOGASTRODUODENOSCOPY WITH BIOPSY - FLEXIBLE N/A 05/18/2017    Performed by Virgina Organ, MD at [facility] ENDO   ? COLONOSCOPY DIAGNOSTIC WITH SPECIMEN COLLECTION BY BRUSHING/ WASHING - FLEXIBLE N/A 05/18/2017    Performed by Virgina Organ, MD at [facility] ENDO   ? LUNG SURGERY  07/24/2017    removal of lung wedge   ? left video assisted thoracoscopy with wedge resection Left 07/24/2017    Performed by Bryson Dames, MD at [facility] CVOR   ? ESOPHAGOGASTRODUODENOSCOPY WITH SPECIMEN COLLECTION BY BRUSHING/ WASHING N/A 05/23/2019    Performed by Dawna Part, MD at [facility] ENDO   ? BRONCHOSCOPY DIAGNOSTIC WITH/ WITHOUT  CELL WASHING - FLEXIBLE N/A 12/05/2019    Performed by Boykin Nearing, Sherryl Manges, MD at [facility] OR   ? BRONCHOSCOPY WITH IMAGE - GUIDED NAVIGATION - FLEXIBLE N/A 12/05/2019    Performed by Boykin Nearing, Sherryl Manges, MD at [facility] OR   ? BRONCHOSCOPY WITH ENDOBRONCHIAL ULTRASOUND GUIDED TRANSTRACHEAL/ TRANSBRONCHIAL SAMPLING - 3 OR MORE MEDIASTINAL/ HILAR LYMPH NODE STATIONS/ STRUCTURE - FLEXIBLE N/A 12/05/2019    Performed by Boykin Nearing, Maykol Elvera Lennox, MD at [facility] OR   ? BRONCHOSCOPY WITH TRANSBRONCHIAL LUNG BIOPSY - FLEXIBLE - SINGLE LOBE N/A 12/05/2019    Performed by Boykin Nearing, Sherryl Manges, MD at [facility] OR   ? BRONCHOSCOPY WITH TRANSBRONCHIAL NEEDLE ASPIRATION AND BIOPSY TRACHEA/ MAIN STEM/ LOBAR BRONCHUS - FLEXIBLE N/A 12/05/2019 Performed by Boykin Nearing, Maykol Elvera Lennox, MD at [facility] OR   ? BRONCHOSCOPY WITH BRUSHING/ PROTECTED BRUSHING - FLEXIBLE N/A 12/05/2019    Performed by Boykin Nearing, Sherryl Manges, MD at [facility] OR   ? LIVER SURGERY  06/2017, 07/2017    x 2 Ablasion    ? LYMPH NODE BIOPSY      back   ? SKIN CANCER EXCISION      sqc- lip and nose         FAMILY HISTORY:  Family History   Problem Relation Age of Onset   ? Heart Failure Mother    ? Cancer Mother         Stomach   ? Heart Failure Father         bypass   ? Heart Disease Father    ? Stroke Brother         age 77   ? Cancer-Colon Brother    ? Aneurysm Brother    ? Diabetes Brother    ? Abnormal EKG Brother         Afib   ? Cancer Sister         Pancreatic   ? Heart Disease Maternal Grandfather    ? Melanoma Neg Hx           SOCIAL HISTORY:  Social History     Socioeconomic History   ? Marital status: Married     Spouse name: Not on file   ? Number of children: Not on file   ? Years of education: Not on file   ? Highest education level: Not on file   Occupational History   ? Not on file   Tobacco Use   ? Smoking status: Former Smoker     Packs/day: 0.50     Years: 50.00     Pack years: 25.00     Types: Cigarettes     Quit date: 03/10/2017     Years since quitting: 3.1   ? Smokeless tobacco: Never Used   Vaping Use   ? Vaping Use: Never used   Substance and Sexual Activity   ? Alcohol use: Not Currently     Comment: couple times/year   ? Drug use: Never   ? Sexual activity: Not on file   Other Topics Concern   ? Not on file   Social History Narrative   ? Not on file          ALLERGY:   Allergies   Allergen Reactions   ? Ciprofloxacin BLISTERS   ? Latex RASH and REDNESS     Pt states it tore up her skin          MEDICATION:  Current Outpatient Medications on File  Prior to Visit   Medication Sig Dispense Refill   ? aspirin EC 81 mg tablet Take 81 mg by mouth daily after lunch. Take with food.      ? azaTHIOprine (IMURAN) 50 mg tablet TAKE 1 TABLET BY MOUTH AT BEDTIME 90 tablet 3   ? calcium carbonate/vitamin D2 (CALCIUM + VITAMIN D PO) Take  by mouth twice daily.     ? CONSTULOSE 10 gram/15 mL oral solution TAKE BY MOUTH TWO TO THREE TIMES DAILY (TITRATES TO 2-3 BOWEL MOVEMENTS DAILY) 1892 mL 11   ? dexAMETHasone (DECADRON) 4 mg tablet Take one tablet by mouth daily for 3 days. 3 tablet 3   ? furosemide (LASIX) 20 mg tablet Take one tablet by mouth every morning. 90 tablet 3   ? omeprazole DR (PRILOSEC) 20 mg capsule Take one capsule by mouth daily before breakfast. 90 capsule 3   ? ondansetron HCL (ZOFRAN) 8 mg tablet Take one tablet by mouth every 8 hours as needed (nausea and vomiting). 30 tablet 3   ? oxyCODONE-acetaminophen (PERCOCET) 7.5-325 mg tablet Take 1 tablet by mouth every 12 hours as needed for Pain     ? rifAXIMin (XIFAXAN) 550 mg tablet Take one tablet by mouth every 12 hours. (Patient taking differently: Take  by mouth every 12 hours. Indications: impaired brain function due to liver disease) 60 tablet 11   ? spironolactone (ALDACTONE) 50 mg tablet Take one tablet by mouth daily with breakfast. Take with food. 30 tablet 5     No current facility-administered medications on file prior to visit.          REVIEW OF SYSTEMS:    Constitutional: No weight loss. No fever or chills  Eyes: No red eyes. No itchy eyes. No double vision. No change on vision acuity    Ear, Nose, Mouth & Throat: No hearing loss. No discharge. No sore throat   Cardiovascular: No chest pain. No palpitations. No syncope  Respiratory: + mild shortness of breath with exertion. + Occasional cough  Gastrointestinal: No nausea or vomiting. No difficulty swallowing. No abdominal pain. No diarrhea or constipation. No heartburn, throat pain or difficulty swallowing.  Genitourinary: No burning urination. No change in the color of urine  Musculoskeletal: No joints pain. No swelling of the upper or lower extremities    Integumentary: No rash.    Neuro:  No headache. No focal weakness or numbness in the upper or lower extremities  Endocrine: No heat or cold intolerance. No excessive sweating  All other systems are negative.       PHYSICAL EXAMINATION:  Vitals:    04/26/20 0841   BP: 122/79   Pulse: 80   Resp: 17   Temp: 36.3 ?C (97.4 ?F)   SpO2: 98%       ECOG performance status: 1  ?  Constitutional: Alert and oriented x3.    Eyes: Pupils are equal bilaterally. No redness. No jaundice.   Ear, Nose, Mouth & Throat: Ear canals are patent. No discharge from the nose. Mucous membranes are moist  Cardiovascular: Regular rate and rhythm.    Respiratory: Good air entry bilaterally. No crackles or wheezes.  Gastrointestinal: Soft. Nontender. Nondistended. Good bowel sounds. No hepatosplenomegaly    Musculoskeletal & Extremities: No limitation in the joints' range of motion. No lower extremity edema, + right leg AKA  Neuro:  CN II-XII intact. Strength in the upper and lower extremities: intact. Sensations in the upper and lower extremities: intact  Integumentary: No rash.    Heme/Lymph/Immunology: No cervical, supraclavicular, axillary or inguinal lymphadenopathy    LABS: Reviewed.      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  - Patient is seen by thoracic surgeon Dr. Derenda Fennel and considered not an ideal surgical candidate.  - PET/CT showed increased uptake about the resection margin, which is concerning for residual or recurrent tumor. Case was discussed in the Tumor Board. An EBUS was performed which showed N0, but previous surgical site was positive for squamous carcinoma. Therefore this can be justified for T3 disease and making adjuvant chemotherapy reasonable after SBRT. However I did tell patient that this is an extrapolation of data from patients who got complete surgical resection, and the 5 year survival benefit is marginal. I will also have a discussion with [name] for the appropriateness of adjuvant chemotherapy due to her liver disease.  - She is now s/p SBRT. Today we re-discussed possible adjuvant chemotherapy and immunotherapy. I presented current data but made it clear that they are all from surgical patients. In addition, patient is 3 months out of SBRT which makes the benefit less clear. 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 Patient expressed understanding. After discussing the benefits and risks of each treatment, patient decided NOT to go for adjuvant treatment. We will therefore start active surveillance. Plan to repeat another scan 3 months from her last one.  - RTC in 2-3 months with CBC, CMP after restaging CT chest.      Above A/P were explained in detail to the patient and family who expressed understanding. All the relevant questions were addressed appropriately. Patient and family understand in case of fever (>100.4), worsening chest pain or shortness of breath, or any other new symptoms, our on-call physician can be reached and 911 shall be called in case of emergencies.     ---  Willis Modena, MD, PhD  Associate Professor  Division of Medical Oncology  Department of Internal Medicine  [place] [facility]    CC:  Bryson Dames, MD  Raed Tamsen Meek, MD  Jason Nest, MD

## 2020-04-26 ENCOUNTER — Encounter: Admit: 2020-04-26 | Discharge: 2020-04-26 | Payer: MEDICARE

## 2020-04-26 DIAGNOSIS — C801 Malignant (primary) neoplasm, unspecified: Secondary | ICD-10-CM

## 2020-04-26 DIAGNOSIS — D899 Disorder involving the immune mechanism, unspecified: Secondary | ICD-10-CM

## 2020-04-26 DIAGNOSIS — J439 Emphysema, unspecified: Secondary | ICD-10-CM

## 2020-04-26 DIAGNOSIS — K759 Inflammatory liver disease, unspecified: Secondary | ICD-10-CM

## 2020-04-26 DIAGNOSIS — K7581 Nonalcoholic steatohepatitis (NASH): Secondary | ICD-10-CM

## 2020-04-26 DIAGNOSIS — C3412 Malignant neoplasm of upper lobe, left bronchus or lung: Secondary | ICD-10-CM

## 2020-04-26 DIAGNOSIS — K219 Gastro-esophageal reflux disease without esophagitis: Secondary | ICD-10-CM

## 2020-04-26 DIAGNOSIS — C4402 Squamous cell carcinoma of skin of lip: Secondary | ICD-10-CM

## 2020-04-26 DIAGNOSIS — C220 Liver cell carcinoma: Secondary | ICD-10-CM

## 2020-04-26 DIAGNOSIS — C499 Malignant neoplasm of connective and soft tissue, unspecified: Secondary | ICD-10-CM

## 2020-04-26 DIAGNOSIS — R911 Solitary pulmonary nodule: Secondary | ICD-10-CM

## 2020-04-26 DIAGNOSIS — K5790 Diverticulosis of intestine, part unspecified, without perforation or abscess without bleeding: Secondary | ICD-10-CM

## 2020-04-26 LAB — COMPREHENSIVE METABOLIC PANEL
Lab: 0.5 mg/dL (ref 0.3–1.2)
Lab: 10 U/L (ref 7–56)
Lab: 102 U/L (ref 25–110)
Lab: 103 MMOL/L (ref 98–110)
Lab: 139 MMOL/L (ref 137–147)
Lab: 19 U/L — ABNORMAL LOW (ref 7–40)
Lab: 4.2 MMOL/L (ref 3.5–5.1)
Lab: 5 K/UL (ref 3–12)
Lab: 7.5 g/dL (ref 6.0–8.0)
Lab: 9.7 mg/dL — ABNORMAL LOW (ref 8.5–10.6)
Lab: >60 mL/min (ref >60)

## 2020-04-26 LAB — CBC AND DIFF
Lab: 12 g/dL (ref 12.0–15.0)
Lab: 3.5 K/UL — ABNORMAL LOW (ref 4.5–11.0)
Lab: 3.7 M/UL — ABNORMAL LOW (ref 4.0–5.0)
Lab: 34 % — ABNORMAL LOW (ref 36–45)

## 2020-05-18 ENCOUNTER — Encounter: Admit: 2020-05-18 | Discharge: 2020-05-18 | Payer: MEDICARE

## 2020-05-18 DIAGNOSIS — C499 Malignant neoplasm of connective and soft tissue, unspecified: Secondary | ICD-10-CM

## 2020-05-18 DIAGNOSIS — C220 Liver cell carcinoma: Secondary | ICD-10-CM

## 2020-05-18 DIAGNOSIS — D899 Disorder involving the immune mechanism, unspecified: Secondary | ICD-10-CM

## 2020-05-18 DIAGNOSIS — C801 Malignant (primary) neoplasm, unspecified: Secondary | ICD-10-CM

## 2020-05-18 DIAGNOSIS — K7581 Nonalcoholic steatohepatitis (NASH): Secondary | ICD-10-CM

## 2020-05-18 DIAGNOSIS — K759 Inflammatory liver disease, unspecified: Secondary | ICD-10-CM

## 2020-05-18 DIAGNOSIS — J439 Emphysema, unspecified: Secondary | ICD-10-CM

## 2020-05-18 DIAGNOSIS — K219 Gastro-esophageal reflux disease without esophagitis: Secondary | ICD-10-CM

## 2020-05-18 DIAGNOSIS — C4402 Squamous cell carcinoma of skin of lip: Secondary | ICD-10-CM

## 2020-05-18 DIAGNOSIS — R911 Solitary pulmonary nodule: Secondary | ICD-10-CM

## 2020-05-18 DIAGNOSIS — K5790 Diverticulosis of intestine, part unspecified, without perforation or abscess without bleeding: Secondary | ICD-10-CM

## 2020-06-02 ENCOUNTER — Encounter: Admit: 2020-06-02 | Discharge: 2020-06-02 | Payer: MEDICARE

## 2020-06-02 DIAGNOSIS — C3412 Malignant neoplasm of upper lobe, left bronchus or lung: Secondary | ICD-10-CM

## 2020-06-02 DIAGNOSIS — C220 Liver cell carcinoma: Secondary | ICD-10-CM

## 2020-06-02 NOTE — Telephone Encounter
Attempted to call pt to notify her of schedule changes d/t the lack of CT contrast and notify her of the date/time of the MRI. Left VM w/ contact information for return call.

## 2020-06-03 ENCOUNTER — Encounter: Admit: 2020-06-03 | Discharge: 2020-06-03 | Payer: MEDICARE

## 2020-06-03 NOTE — Telephone Encounter
Confirmed appts.

## 2020-06-04 ENCOUNTER — Encounter: Admit: 2020-06-04 | Discharge: 2020-06-04 | Payer: MEDICARE

## 2020-06-04 MED ORDER — RIFAXIMIN 550 MG PO TAB
550 mg | ORAL_TABLET | Freq: Two times a day (BID) | ORAL | 11 refills | 30.00000 days | Status: AC
Start: 2020-06-04 — End: ?

## 2020-06-04 NOTE — Telephone Encounter
Received call from [facility] stating that they had received electronic notice for Xifaxan to be discontinued. RN advises that this should not be discontinued. Pharmacy requesting a new prescription. RN resent to [employer].

## 2020-06-09 ENCOUNTER — Encounter: Admit: 2020-06-09 | Discharge: 2020-06-09 | Payer: MEDICARE

## 2020-06-11 ENCOUNTER — Ambulatory Visit: Admit: 2020-06-11 | Discharge: 2020-06-11 | Payer: MEDICARE

## 2020-06-11 ENCOUNTER — Encounter: Admit: 2020-06-11 | Discharge: 2020-06-11 | Payer: MEDICARE

## 2020-06-11 DIAGNOSIS — M810 Age-related osteoporosis without current pathological fracture: Secondary | ICD-10-CM

## 2020-06-11 DIAGNOSIS — K7210 Chronic hepatic failure without coma: Secondary | ICD-10-CM

## 2020-06-14 ENCOUNTER — Encounter: Admit: 2020-06-14 | Discharge: 2020-06-14 | Payer: MEDICARE

## 2020-06-14 ENCOUNTER — Ambulatory Visit: Admit: 2020-06-14 | Discharge: 2020-06-14 | Payer: MEDICARE

## 2020-06-14 DIAGNOSIS — C220 Liver cell carcinoma: Secondary | ICD-10-CM

## 2020-06-14 LAB — CBC AND DIFF
ABSOLUTE BASO COUNT: 0 K/UL (ref 0–0.20)
ABSOLUTE EOS COUNT: 0.1 K/UL (ref 0–0.45)
ABSOLUTE LYMPH COUNT: 0.2 K/UL — ABNORMAL LOW (ref 1.0–4.8)
ABSOLUTE MONO COUNT: 0.3 K/UL (ref 0–0.80)
ABSOLUTE NEUTROPHIL: 4.2 K/UL (ref 1.8–7.0)
BASOPHILS %: 1 % (ref 0–2)
EOSINOPHILS %: 2 % (ref >60)
HEMATOCRIT: 34 % — ABNORMAL LOW (ref 36–45)
HEMOGLOBIN: 11 g/dL — ABNORMAL LOW (ref 12.0–15.0)
LYMPHOCYTES %: 6 % — ABNORMAL LOW (ref 24–44)
MCH: 31 pg (ref 26–34)
MCHC: 34 g/dL (ref 32.0–36.0)
MCV: 93 FL (ref 80–100)
MONOCYTES %: 6 % (ref 4–12)
MPV: 9 FL (ref 7–11)
NEUTROPHILS %: 85 % — ABNORMAL HIGH (ref 41–77)
PLATELET COUNT: 181 K/UL (ref 150–400)
RBC COUNT: 3.7 M/UL — ABNORMAL LOW (ref 4.0–5.0)
RDW: 13 % (ref 11–15)
WBC COUNT: 5 K/UL (ref 4.5–11.0)

## 2020-06-14 LAB — PROTIME INR (PT)
INR: 1 (ref 0.8–1.2)
PROTIME: 11 s (ref 9.5–14.2)

## 2020-06-14 LAB — ALPHA FETO PROTEIN (AFP): AFP: 2.2 ng/mL (ref 0.0–15.0)

## 2020-06-14 LAB — COMPREHENSIVE METABOLIC PANEL
POTASSIUM: 4.5 MMOL/L (ref 3.5–5.1)
SODIUM: 142 MMOL/L (ref 137–147)

## 2020-06-14 LAB — POC CREATININE, RAD: CREATININE, POC: 0.9 mg/dL (ref 0.4–1.00)

## 2020-06-14 MED ORDER — SODIUM CHLORIDE 0.9 % IJ SOLN
50 mL | Freq: Once | INTRAVENOUS | 0 refills | Status: AC
Start: 2020-06-14 — End: ?

## 2020-06-14 MED ORDER — GADOXETATE 0.25 MMOL/ML (181.43 MG/ML) IV SOLN
10 mL | Freq: Once | INTRAVENOUS | 0 refills | Status: CP
Start: 2020-06-14 — End: ?
  Administered 2020-06-14: 15:00:00 10 mL via INTRAVENOUS

## 2020-06-18 ENCOUNTER — Encounter: Admit: 2020-06-18 | Discharge: 2020-06-18 | Payer: MEDICARE

## 2020-06-18 DIAGNOSIS — C220 Liver cell carcinoma: Secondary | ICD-10-CM

## 2020-06-18 DIAGNOSIS — C499 Malignant neoplasm of connective and soft tissue, unspecified: Secondary | ICD-10-CM

## 2020-06-18 DIAGNOSIS — C801 Malignant (primary) neoplasm, unspecified: Secondary | ICD-10-CM

## 2020-06-18 DIAGNOSIS — K5790 Diverticulosis of intestine, part unspecified, without perforation or abscess without bleeding: Secondary | ICD-10-CM

## 2020-06-18 DIAGNOSIS — R911 Solitary pulmonary nodule: Secondary | ICD-10-CM

## 2020-06-18 DIAGNOSIS — K759 Inflammatory liver disease, unspecified: Secondary | ICD-10-CM

## 2020-06-18 DIAGNOSIS — K219 Gastro-esophageal reflux disease without esophagitis: Secondary | ICD-10-CM

## 2020-06-18 DIAGNOSIS — D899 Disorder involving the immune mechanism, unspecified: Secondary | ICD-10-CM

## 2020-06-18 DIAGNOSIS — K7581 Nonalcoholic steatohepatitis (NASH): Secondary | ICD-10-CM

## 2020-06-18 DIAGNOSIS — J439 Emphysema, unspecified: Secondary | ICD-10-CM

## 2020-06-18 DIAGNOSIS — C4402 Squamous cell carcinoma of skin of lip: Secondary | ICD-10-CM

## 2020-06-18 NOTE — Patient Instructions
Dr.Raed Al-Rajabi, MD Medical Oncologist specializing in Gastro-Intestinal malignancies   Chelsea Johanson, Physician Assistant  Lauren Barker, RN Darcel Frane, RN Bri Zenner, RN    Nurse Phone: [phone]-Monday-Friday 8:00am- 4:00pm   After Hours Phone: [phone]- On call number for urgent needs outside of business hours ask for the On call Oncology fellow to be paged and they will call you back  Scheduling: [phone]- call with any scheduling or appointment questions   Medication refills: please contact your pharmacy for medication refills, if they do not have any refills on file they will contact the office, make sure they have our correct contact information on file P: [phone], F: [phone]  MyChart Messages: All mychart messages are answered by the nurses, even if you select to send the message to Dr. Al-Rajabi or Chelsea. We often communicate with Dr. Al-Rajabi or Chelsea regarding how to answer these messages. Messages are answered 8:00am-4:00pm Monday-Friday.  Phone Calls: We are typically not at our desks where our phones are located as we are in clinic. Please leave us a message as we check our messages several times per day. It is our goal to answer these messages as soon as possible. Messages are answered from 8:00am-4:00 pm Monday-Friday. Please make sure you leave your full name with the spelling, date of birth, and reason for your call when leaving a message.

## 2020-06-18 NOTE — Progress Notes
Name: Sarah Pineda          MRN: [id number]      DOB: February 03, 1950      AGE: 70 y.o.   DATE OF SERVICE: 06/18/2020    Subjective:             Reason for Visit:  No chief complaint on file.      Sarah Pineda is a 70 y.o. female.     Cancer Staging  Hepatocellular carcinoma (HCC)  Staging form: Liver, AJCC 8th Edition  - Clinical: cT1, cN0, cM0 - Signed by Dayton Bailiff, MD on 06/06/2017    Non-small cell lung cancer (HCC)  Staging form: Lung, AJCC 8th Edition  - Pathologic: Stage IA2 (pT1b, pN0, cM0) - Signed by Dayton Bailiff, MD on 08/03/2017  - Pathologic: No stage assigned - Unsigned        History of Present Illness     Sarah Pineda doing really well.  Did well after radiation.  She has been followed by thoracic oncology and after discussing the risks and benefits of adjuvant therapy decided to go on surveillance.  She is doing well denies any complaints.  Denies any ascites or encephalopathy.  Here to discuss her scans.           Review of Systems   Constitutional: Negative for fatigue, fever and unexpected weight change.   HENT: Negative for facial swelling, mouth sores, sore throat, tinnitus, trouble swallowing and voice change.    Respiratory: Negative for cough, choking, chest tightness, shortness of breath and wheezing.    Cardiovascular: Negative for chest pain, palpitations and leg swelling.   Gastrointestinal: Negative for abdominal distention, abdominal pain, anal bleeding, blood in stool, constipation, diarrhea, nausea, rectal pain and vomiting.   Genitourinary: Negative for difficulty urinating, dysuria, frequency and hematuria.   Musculoskeletal: Negative for arthralgias, back pain, gait problem, joint swelling, myalgias and neck pain.   Skin: Negative for pallor, rash and wound.   Neurological: Negative for dizziness, seizures, syncope, speech difficulty, weakness, light-headedness, numbness and headaches.   Hematological: Negative for adenopathy. Does not bruise/bleed easily.   Psychiatric/Behavioral: Negative for dysphoric mood and sleep disturbance. The patient is not nervous/anxious.          Objective:         ? aspirin EC 81 mg tablet Take 81 mg by mouth daily after lunch. Take with food.    ? azaTHIOprine (IMURAN) 50 mg tablet TAKE 1 TABLET BY MOUTH AT BEDTIME   ? calcium carbonate/vitamin D2 (CALCIUM + VITAMIN D PO) Take  by mouth twice daily.   ? CONSTULOSE 10 gram/15 mL oral solution TAKE BY MOUTH TWO TO THREE TIMES DAILY (TITRATES TO 2-3 BOWEL MOVEMENTS DAILY)   ? furosemide (LASIX) 20 mg tablet Take one tablet by mouth every morning.   ? omeprazole DR (PRILOSEC) 20 mg capsule Take one capsule by mouth daily before breakfast.   ? ondansetron HCL (ZOFRAN) 8 mg tablet Take one tablet by mouth every 8 hours as needed (nausea and vomiting).   ? oxyCODONE-acetaminophen (PERCOCET) 7.5-325 mg tablet Take 1 tablet by mouth every 12 hours as needed for Pain   ? rifAXIMin (XIFAXAN) 550 mg tablet Take one tablet by mouth every 12 hours.   ? spironolactone (ALDACTONE) 50 mg tablet Take one tablet by mouth daily with breakfast. Take with food.     Vitals:    06/18/20 1125 06/18/20 1126   BP:  128/68   BP Source:  Arm, Right Upper   Pulse:  89   Temp:  36.4 ?C (97.5 ?F)   Resp:  16   SpO2:  100%   O2 Device:  None (Room air)   TempSrc:  Temporal   PainSc: Zero    Weight:  79.8 kg (176 lb)   Height:  167.6 cm (5' 5.98)     Body mass index is 28.42 kg/m?Sarah Pineda Kitchen     Pain Score: Zero       Fatigue Scale: 0-None    Pain Addressed:  N/A    Patient Evaluated for a Clinical Trial: No treatment clinical trial available for this patient.     [place] Cooperative Oncology Group performance status is 1, Restricted in physically strenuous activity but ambulatory and able to carry out work of a light or sedentary nature, e.g., light house work, office work.     Physical Exam  Constitutional:       General: She is not in acute distress.     Appearance: Normal appearance. She is well-developed.   HENT:      Head: Normocephalic and atraumatic.   Eyes:      General: No scleral icterus.        Right eye: No discharge.      Pupils: Pupils are equal, round, and reactive to light.   Neck:      Thyroid: No thyromegaly.   Cardiovascular:      Rate and Rhythm: Normal rate and regular rhythm.      Heart sounds: Normal heart sounds. No murmur heard.    No friction rub.   Pulmonary:      Effort: No respiratory distress.      Breath sounds: Normal breath sounds. No wheezing or rales.   Chest:   Breasts:      Right: No supraclavicular adenopathy.      Left: No supraclavicular adenopathy.       Abdominal:      General: Bowel sounds are normal. There is no distension.      Palpations: Abdomen is soft. There is no hepatomegaly, splenomegaly or mass.      Tenderness: There is no abdominal tenderness. There is no guarding or rebound.      Hernia: No hernia is present.   Musculoskeletal:         General: No tenderness.      Cervical back: Full passive range of motion without pain, normal range of motion and neck supple.      Comments: Right lower limb amputation   Lymphadenopathy:      Head:      Right side of head: No submental, submandibular, tonsillar, preauricular, posterior auricular or occipital adenopathy.      Left side of head: No submental, submandibular, tonsillar, preauricular, posterior auricular or occipital adenopathy.      Cervical: No cervical adenopathy.      Upper Body:      Right upper body: No supraclavicular adenopathy.      Left upper body: No supraclavicular adenopathy.   Skin:     General: Skin is warm.      Findings: No petechiae or rash. Rash is not purpuric.   Neurological:      Mental Status: She is alert and oriented to person, place, and time.      Cranial Nerves: No cranial nerve deficit.      Sensory: No sensory deficit.      Coordination: Coordination normal.      Deep Tendon Reflexes: Reflexes are  normal and symmetric.   Psychiatric:         Mood and Affect: Mood is not anxious or depressed.         Behavior: Behavior normal. Thought Content: Thought content normal.         Judgment: Judgment normal.                   Assessment and Plan:                      1. Hepatocellular carcinoma biopsy proven.   - Status post her second chemoembolization tolerated that very well.    - We reviewed her labs and tumor marker which are unremarkable. AFP is 2.1.  - Reviewed her CT AP which is negative, except for stable cirrhosis, no ascites.   - Had EGD on 05/23/2019 which showed esophageal varices, small and non-bleeding, so no intervention needed.    Sarah Pineda is currently on surveillance.  We discussed the results of her MRI with and without contrast this was done due to our IV iodine contrast shortage.  I explained that there is no evidence of recurrent disease.  There is stable LI-RADS 3 lesion in segment 8 we will continue to monitor over time.  For now we will continue with her scans every 3 months.  If her neck scan is negative we could potentially space out her visits every 6 months.  Plan:  - We reviewed her labs and scans, stable. Normal AFP.  - RTC in 3 months.      2. Abnormal lung nodule of the left upper lobe suspicious for primary neoplasm of the lung .  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    Squamous cell carcinoma of the lung following with thoracic oncology.    3. 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     4. Autoimmune hepatitis   - Following up with Dr. Ladona Ridgel well compensated.    Follow-up in 3 months with restaging scans of the abdomen we will leave her chest imaging to thoracic oncology.  Sarah Pineda Kitchen

## 2020-06-20 ENCOUNTER — Encounter: Admit: 2020-06-20 | Discharge: 2020-06-20 | Payer: MEDICARE

## 2020-06-20 DIAGNOSIS — K7581 Nonalcoholic steatohepatitis (NASH): Secondary | ICD-10-CM

## 2020-06-20 DIAGNOSIS — J439 Emphysema, unspecified: Secondary | ICD-10-CM

## 2020-06-20 DIAGNOSIS — C220 Liver cell carcinoma: Secondary | ICD-10-CM

## 2020-06-20 DIAGNOSIS — C499 Malignant neoplasm of connective and soft tissue, unspecified: Secondary | ICD-10-CM

## 2020-06-20 DIAGNOSIS — K759 Inflammatory liver disease, unspecified: Secondary | ICD-10-CM

## 2020-06-20 DIAGNOSIS — K219 Gastro-esophageal reflux disease without esophagitis: Secondary | ICD-10-CM

## 2020-06-20 DIAGNOSIS — C4402 Squamous cell carcinoma of skin of lip: Secondary | ICD-10-CM

## 2020-06-20 DIAGNOSIS — R911 Solitary pulmonary nodule: Secondary | ICD-10-CM

## 2020-06-20 DIAGNOSIS — D899 Disorder involving the immune mechanism, unspecified: Secondary | ICD-10-CM

## 2020-06-20 DIAGNOSIS — K5790 Diverticulosis of intestine, part unspecified, without perforation or abscess without bleeding: Secondary | ICD-10-CM

## 2020-06-20 DIAGNOSIS — C801 Malignant (primary) neoplasm, unspecified: Secondary | ICD-10-CM

## 2020-07-22 ENCOUNTER — Encounter: Admit: 2020-07-22 | Discharge: 2020-07-22 | Payer: MEDICARE

## 2020-07-23 ENCOUNTER — Encounter: Admit: 2020-07-23 | Discharge: 2020-07-23 | Payer: MEDICARE

## 2020-07-23 MED ORDER — SPIRONOLACTONE 50 MG PO TAB
50 mg | ORAL_TABLET | Freq: Every day | ORAL | 1 refills | 90.00000 days | Status: AC
Start: 2020-07-23 — End: ?

## 2020-07-28 ENCOUNTER — Encounter: Admit: 2020-07-28 | Discharge: 2020-07-28 | Payer: MEDICARE

## 2020-07-28 DIAGNOSIS — C3412 Malignant neoplasm of upper lobe, left bronchus or lung: Secondary | ICD-10-CM

## 2020-07-30 ENCOUNTER — Encounter: Admit: 2020-07-30 | Discharge: 2020-07-30 | Payer: MEDICARE

## 2020-07-30 DIAGNOSIS — C3412 Malignant neoplasm of upper lobe, left bronchus or lung: Secondary | ICD-10-CM

## 2020-07-30 LAB — COMPREHENSIVE METABOLIC PANEL
ALBUMIN: 3.9 g/dL — ABNORMAL LOW (ref 3.5–5.0)
ALK PHOSPHATASE: 98 U/L (ref 25–110)
ANION GAP: 5 K/UL — ABNORMAL LOW (ref 3–12)
AST: 22 U/L (ref >60)
BLD UREA NITROGEN: 18 mg/dL (ref 7–25)
CALCIUM: 9.6 mg/dL (ref 8.5–10.6)
CHLORIDE: 100 MMOL/L (ref 98–110)
SODIUM: 134 MMOL/L — ABNORMAL LOW (ref 137–147)
TOTAL BILIRUBIN: 0.5 mg/dL — ABNORMAL HIGH (ref 0.3–1.2)
TOTAL PROTEIN: 7.4 g/dL (ref 6.0–8.0)

## 2020-07-30 LAB — POC CREATININE, RAD: CREATININE, POC: 0.9 mg/dL (ref 0.4–1.00)

## 2020-07-30 LAB — CBC AND DIFF
ABSOLUTE BASO COUNT: 0 K/UL (ref 0–0.20)
ABSOLUTE EOS COUNT: 0.1 K/UL (ref 0–0.45)
ABSOLUTE MONO COUNT: 0.4 K/UL (ref >60)
ABSOLUTE NEUTROPHIL: 4 K/UL (ref 1.8–7.0)
BASOPHILS %: 1 % (ref 0–2)
MCH: 32 pg — ABNORMAL LOW (ref 26–34)

## 2020-07-30 MED ORDER — SODIUM CHLORIDE 0.9 % IJ SOLN
50 mL | Freq: Once | INTRAVENOUS | 0 refills | Status: CP
Start: 2020-07-30 — End: ?
  Administered 2020-07-30: 16:00:00 50 mL via INTRAVENOUS

## 2020-07-30 MED ORDER — IOHEXOL 350 MG IODINE/ML IV SOLN
70 mL | Freq: Once | INTRAVENOUS | 0 refills | Status: CP
Start: 2020-07-30 — End: ?
  Administered 2020-07-30: 16:00:00 70 mL via INTRAVENOUS

## 2020-08-02 ENCOUNTER — Encounter: Admit: 2020-08-02 | Discharge: 2020-08-02 | Payer: MEDICARE

## 2020-08-02 DIAGNOSIS — R911 Solitary pulmonary nodule: Secondary | ICD-10-CM

## 2020-08-02 DIAGNOSIS — K219 Gastro-esophageal reflux disease without esophagitis: Secondary | ICD-10-CM

## 2020-08-02 DIAGNOSIS — C499 Malignant neoplasm of connective and soft tissue, unspecified: Secondary | ICD-10-CM

## 2020-08-02 DIAGNOSIS — C4402 Squamous cell carcinoma of skin of lip: Secondary | ICD-10-CM

## 2020-08-02 DIAGNOSIS — C801 Malignant (primary) neoplasm, unspecified: Secondary | ICD-10-CM

## 2020-08-02 DIAGNOSIS — C3412 Malignant neoplasm of upper lobe, left bronchus or lung: Secondary | ICD-10-CM

## 2020-08-02 DIAGNOSIS — D899 Disorder involving the immune mechanism, unspecified: Secondary | ICD-10-CM

## 2020-08-02 DIAGNOSIS — J439 Emphysema, unspecified: Secondary | ICD-10-CM

## 2020-08-02 DIAGNOSIS — K5790 Diverticulosis of intestine, part unspecified, without perforation or abscess without bleeding: Secondary | ICD-10-CM

## 2020-08-02 DIAGNOSIS — C220 Liver cell carcinoma: Secondary | ICD-10-CM

## 2020-08-02 DIAGNOSIS — K759 Inflammatory liver disease, unspecified: Secondary | ICD-10-CM

## 2020-08-02 DIAGNOSIS — K7581 Nonalcoholic steatohepatitis (NASH): Secondary | ICD-10-CM

## 2020-09-15 ENCOUNTER — Encounter: Admit: 2020-09-15 | Discharge: 2020-09-15 | Payer: MEDICARE

## 2020-09-15 DIAGNOSIS — K759 Inflammatory liver disease, unspecified: Secondary | ICD-10-CM

## 2020-09-15 DIAGNOSIS — C220 Liver cell carcinoma: Secondary | ICD-10-CM

## 2020-09-15 DIAGNOSIS — C4402 Squamous cell carcinoma of skin of lip: Secondary | ICD-10-CM

## 2020-09-15 DIAGNOSIS — E559 Vitamin D deficiency, unspecified: Secondary | ICD-10-CM

## 2020-09-15 DIAGNOSIS — D899 Disorder involving the immune mechanism, unspecified: Secondary | ICD-10-CM

## 2020-09-15 DIAGNOSIS — M810 Age-related osteoporosis without current pathological fracture: Secondary | ICD-10-CM

## 2020-09-15 DIAGNOSIS — K219 Gastro-esophageal reflux disease without esophagitis: Secondary | ICD-10-CM

## 2020-09-15 DIAGNOSIS — R911 Solitary pulmonary nodule: Secondary | ICD-10-CM

## 2020-09-15 DIAGNOSIS — K7210 Chronic hepatic failure without coma: Secondary | ICD-10-CM

## 2020-09-15 DIAGNOSIS — J439 Emphysema, unspecified: Secondary | ICD-10-CM

## 2020-09-15 DIAGNOSIS — K7581 Nonalcoholic steatohepatitis (NASH): Secondary | ICD-10-CM

## 2020-09-15 DIAGNOSIS — C499 Malignant neoplasm of connective and soft tissue, unspecified: Secondary | ICD-10-CM

## 2020-09-15 DIAGNOSIS — R748 Abnormal levels of other serum enzymes: Secondary | ICD-10-CM

## 2020-09-15 DIAGNOSIS — K5790 Diverticulosis of intestine, part unspecified, without perforation or abscess without bleeding: Secondary | ICD-10-CM

## 2020-09-15 DIAGNOSIS — C801 Malignant (primary) neoplasm, unspecified: Secondary | ICD-10-CM

## 2020-09-15 LAB — CBC AND DIFF
ABSOLUTE BASO COUNT: 0 K/UL (ref 0–0.20)
ABSOLUTE EOS COUNT: 0.1 K/UL (ref 0–0.45)
ABSOLUTE LYMPH COUNT: 0.2 K/UL — ABNORMAL LOW (ref 1.0–4.8)
ABSOLUTE MONO COUNT: 0.3 K/UL (ref 0–0.80)
ABSOLUTE NEUTROPHIL: 3.7 K/UL (ref 1.8–7.0)
BASOPHILS %: 1 % (ref 0–2)
EOSINOPHILS %: 2 % (ref >60)
HEMATOCRIT: 34 % — ABNORMAL LOW (ref 36–45)
LYMPHOCYTES %: 4 % — ABNORMAL LOW (ref 24–44)
MCH: 31 pg (ref 26–34)
MCHC: 34 g/dL (ref 32.0–36.0)
MCV: 90 FL (ref 80–100)
MONOCYTES %: 8 % (ref 4–12)
MPV: 7.8 FL (ref 7–11)
NEUTROPHILS %: 85 % — ABNORMAL HIGH (ref 41–77)
PLATELET COUNT: 232 K/UL (ref 150–400)
RBC COUNT: 3.8 M/UL — ABNORMAL LOW (ref 4.0–5.0)
RDW: 14 % (ref 11–15)
WBC COUNT: 4.3 K/UL — ABNORMAL LOW (ref 4.5–11.0)

## 2020-09-15 LAB — POC CREATININE, RAD: CREATININE, POC: 0.9 mg/dL (ref 0.4–1.00)

## 2020-09-15 LAB — COMPREHENSIVE METABOLIC PANEL
POTASSIUM: 4.4 MMOL/L (ref 3.5–5.1)
SODIUM: 129 MMOL/L — ABNORMAL LOW (ref 137–147)

## 2020-09-15 LAB — HEPATITIS B SURFACE AB: HEP B SURFACE ABY: NEGATIVE

## 2020-09-15 LAB — PROTIME INR (PT)
INR: 1.1 (ref 0.8–1.2)
PROTIME: 12 s (ref 9.5–14.2)

## 2020-09-15 LAB — ALPHA FETO PROTEIN (AFP): AFP: 2.3 ng/mL — ABNORMAL LOW (ref 0.0–15.0)

## 2020-09-15 MED ORDER — IOHEXOL 350 MG IODINE/ML IV SOLN
100 mL | Freq: Once | INTRAVENOUS | 0 refills | Status: CP
Start: 2020-09-15 — End: ?
  Administered 2020-09-15: 15:00:00 100 mL via INTRAVENOUS

## 2020-09-15 MED ORDER — SODIUM CHLORIDE 0.9 % IJ SOLN
50 mL | Freq: Once | INTRAVENOUS | 0 refills | Status: CP
Start: 2020-09-15 — End: ?
  Administered 2020-09-15: 15:00:00 50 mL via INTRAVENOUS

## 2020-10-04 ENCOUNTER — Encounter: Admit: 2020-10-04 | Discharge: 2020-10-04 | Payer: MEDICARE

## 2020-10-05 ENCOUNTER — Encounter: Admit: 2020-10-05 | Discharge: 2020-10-05 | Payer: MEDICARE

## 2020-10-05 MED ORDER — CONSTULOSE 10 GRAM/15 ML PO SOLN
Freq: Three times a day (TID) | 0 refills
Start: 2020-10-05 — End: ?

## 2020-10-06 ENCOUNTER — Encounter: Admit: 2020-10-06 | Discharge: 2020-10-06 | Payer: MEDICARE

## 2020-10-13 ENCOUNTER — Encounter: Admit: 2020-10-13 | Discharge: 2020-10-13 | Payer: MEDICARE

## 2020-10-13 ENCOUNTER — Ambulatory Visit: Admit: 2020-10-13 | Discharge: 2020-10-13 | Payer: MEDICARE

## 2020-10-13 DIAGNOSIS — C3492 Malignant neoplasm of unspecified part of left bronchus or lung: Secondary | ICD-10-CM

## 2020-10-13 MED ORDER — SODIUM CHLORIDE 0.9 % IJ SOLN
50 mL | Freq: Once | INTRAVENOUS | 0 refills | Status: CP
Start: 2020-10-13 — End: ?
  Administered 2020-10-13: 16:00:00 50 mL via INTRAVENOUS

## 2020-10-13 MED ORDER — IOHEXOL 350 MG IODINE/ML IV SOLN
70 mL | Freq: Once | INTRAVENOUS | 0 refills | Status: CP
Start: 2020-10-13 — End: ?
  Administered 2020-10-13: 16:00:00 70 mL via INTRAVENOUS

## 2020-10-14 ENCOUNTER — Ambulatory Visit: Admit: 2020-10-14 | Discharge: 2020-10-14 | Payer: MEDICARE

## 2020-10-14 ENCOUNTER — Encounter: Admit: 2020-10-14 | Discharge: 2020-10-14 | Payer: MEDICARE

## 2020-10-14 DIAGNOSIS — C3492 Malignant neoplasm of unspecified part of left bronchus or lung: Secondary | ICD-10-CM

## 2020-10-14 NOTE — Progress Notes
Telehealth Visit Note    Date of Service: 10/14/2020    Subjective:      Obtained patient's verbal consent to treat them and their agreement to [facility] financial policy and NPP via this telehealth visit during the [facility]       Sarah Pineda is a 70 y.o. female.    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  ?  EBUS biopsy showed no mediastinal nodes involvement. T3 lesion in left lung.   ?  She was treated with SBRT to 5000 cGy in 5 fractions, completed on 01/09/2020.  ?  Return for a follow up with CT. She is doing well.        Review of Systems   All other systems reviewed and are negative.        Objective:         ? aspirin EC 81 mg tablet Take 81 mg by mouth daily after lunch. Take with food.    ? azaTHIOprine (IMURAN) 50 mg tablet TAKE 1 TABLET BY MOUTH AT BEDTIME   ? calcium carbonate/vitamin D2 (CALCIUM + VITAMIN D PO) Take  by mouth twice daily.   ? CONSTULOSE 10 gram/15 mL oral solution TAKE BY MOUTH TWO TO THREE TIMES DAILY (TITRATES TO 2-3 BOWEL MOVEMENTS DAILY)   ? furosemide (LASIX) 20 mg tablet Take one tablet by mouth every morning.   ? omeprazole DR (PRILOSEC) 20 mg capsule Take one capsule by mouth daily before breakfast.   ? ondansetron HCL (ZOFRAN) 8 mg tablet Take one tablet by mouth every 8 hours as needed (nausea and vomiting).   ? oxyCODONE-acetaminophen (PERCOCET) 7.5-325 mg tablet Take 1 tablet by mouth every 12 hours as needed for Pain   ? rifAXIMin (XIFAXAN) 550 mg tablet Take one tablet by mouth every 12 hours.   ? spironolactone (ALDACTONE) 50 mg tablet Take one tablet by mouth daily with breakfast. Take with food.           Telehealth Body Mass Index: 27.76 at 10/14/2020  9:23 AM    Physical Exam    CT of chest 10/13/2020  Left upper lobe wedge resection with increased bandlike consolidation   along the suture line, likely evolving radiation induced lung injury.   Treated left upper lobe nodule is obscured by the consolidation.     Larger 4 mm left apical nodule which was a previously new punctate nodule   on 04/07/2020. This is indeterminate though suspicious for metastasis.   Short-term CT chest in 2-3 months is recommended for reevaluation as this   is likely too small to biopsy or characterize on PET/CT.     New punctate medial right lower lobe nodule which is also indeterminate   and can be reevaluated on follow-up CT chest.  Decreased conspicuity of previously described subtle right lung   groundglass opacities.     No lymphadenopathy.        Assessment and Plan:  1. Discussed CT finding of no progression of treated left lung masses. Two tiny nodules in both lung are too small to characterize.  2. Recommended 6 month follow up with CT.                        20 minutes spent on this patient's encounter with counseling and coordination of care taking >50% of the visit.

## 2020-10-26 ENCOUNTER — Encounter: Admit: 2020-10-26 | Discharge: 2020-10-26 | Payer: MEDICARE

## 2020-10-26 DIAGNOSIS — C3412 Malignant neoplasm of upper lobe, left bronchus or lung: Secondary | ICD-10-CM

## 2020-10-28 ENCOUNTER — Encounter: Admit: 2020-10-28 | Discharge: 2020-10-28 | Payer: MEDICARE

## 2020-10-28 DIAGNOSIS — K7210 Chronic hepatic failure without coma: Secondary | ICD-10-CM

## 2020-10-28 DIAGNOSIS — K754 Autoimmune hepatitis: Secondary | ICD-10-CM

## 2020-10-28 MED ORDER — FUROSEMIDE 20 MG PO TAB
20 mg | ORAL_TABLET | Freq: Every morning | ORAL | 3 refills | 90.00000 days | Status: AC
Start: 2020-10-28 — End: ?

## 2020-10-28 MED ORDER — AZATHIOPRINE 50 MG PO TAB
50 mg | ORAL_TABLET | Freq: Every evening | ORAL | 3 refills | Status: AC
Start: 2020-10-28 — End: ?

## 2020-11-01 ENCOUNTER — Encounter: Admit: 2020-11-01 | Discharge: 2020-11-01 | Payer: MEDICARE

## 2020-11-01 DIAGNOSIS — C3412 Malignant neoplasm of upper lobe, left bronchus or lung: Secondary | ICD-10-CM

## 2020-11-01 DIAGNOSIS — K759 Inflammatory liver disease, unspecified: Secondary | ICD-10-CM

## 2020-11-01 DIAGNOSIS — R911 Solitary pulmonary nodule: Secondary | ICD-10-CM

## 2020-11-01 DIAGNOSIS — D899 Disorder involving the immune mechanism, unspecified: Secondary | ICD-10-CM

## 2020-11-01 DIAGNOSIS — J439 Emphysema, unspecified: Secondary | ICD-10-CM

## 2020-11-01 DIAGNOSIS — K7581 Nonalcoholic steatohepatitis (NASH): Secondary | ICD-10-CM

## 2020-11-01 DIAGNOSIS — C499 Malignant neoplasm of connective and soft tissue, unspecified: Secondary | ICD-10-CM

## 2020-11-01 DIAGNOSIS — K219 Gastro-esophageal reflux disease without esophagitis: Secondary | ICD-10-CM

## 2020-11-01 DIAGNOSIS — C801 Malignant (primary) neoplasm, unspecified: Secondary | ICD-10-CM

## 2020-11-01 DIAGNOSIS — K5790 Diverticulosis of intestine, part unspecified, without perforation or abscess without bleeding: Secondary | ICD-10-CM

## 2020-11-01 DIAGNOSIS — C220 Liver cell carcinoma: Secondary | ICD-10-CM

## 2020-11-01 DIAGNOSIS — C4402 Squamous cell carcinoma of skin of lip: Secondary | ICD-10-CM

## 2020-11-01 LAB — CBC AND DIFF
ABSOLUTE BASO COUNT: 0 K/UL — ABNORMAL LOW (ref 0–0.20)
ABSOLUTE EOS COUNT: 0.1 K/UL (ref 0–0.45)
ABSOLUTE LYMPH COUNT: 0.4 K/UL — ABNORMAL LOW (ref 1.0–4.8)
ABSOLUTE MONO COUNT: 0.3 K/UL (ref 0–0.80)
ABSOLUTE NEUTROPHIL: 5.3 K/UL (ref 1.8–7.0)
BASOPHILS %: 1 % (ref >60)
EOSINOPHILS %: 2 % (ref 0–5)
HEMATOCRIT: 35 % — ABNORMAL LOW (ref 36–45)
LYMPHOCYTES %: 7 % — ABNORMAL LOW (ref 24–44)
MCH: 31 pg (ref 26–34)
MCV: 90 FL (ref 80–100)
MPV: 8.8 FL (ref 7–11)
NEUTROPHILS %: 85 % — ABNORMAL HIGH (ref 41–77)
PLATELET COUNT: 233 K/UL (ref 150–400)
RBC COUNT: 3.9 M/UL — ABNORMAL LOW (ref 4.0–5.0)

## 2020-11-01 LAB — COMPREHENSIVE METABOLIC PANEL
ALT: 11 U/L (ref 7–56)
GLUCOSE,PANEL: 89 mg/dL (ref 70–100)
POTASSIUM: 4.3 MMOL/L (ref 3.5–5.1)
SODIUM: 133 MMOL/L — ABNORMAL LOW (ref 137–147)
TOTAL PROTEIN: 7.4 g/dL (ref 6.0–8.0)

## 2020-11-18 ENCOUNTER — Ambulatory Visit: Admit: 2020-11-18 | Discharge: 2020-11-18 | Payer: MEDICARE

## 2020-11-18 ENCOUNTER — Encounter: Admit: 2020-11-18 | Discharge: 2020-11-18 | Payer: MEDICARE

## 2020-11-18 DIAGNOSIS — D899 Disorder involving the immune mechanism, unspecified: Secondary | ICD-10-CM

## 2020-11-18 DIAGNOSIS — K759 Inflammatory liver disease, unspecified: Secondary | ICD-10-CM

## 2020-11-18 DIAGNOSIS — L821 Other seborrheic keratosis: Secondary | ICD-10-CM

## 2020-11-18 DIAGNOSIS — C499 Malignant neoplasm of connective and soft tissue, unspecified: Secondary | ICD-10-CM

## 2020-11-18 DIAGNOSIS — K5790 Diverticulosis of intestine, part unspecified, without perforation or abscess without bleeding: Secondary | ICD-10-CM

## 2020-11-18 DIAGNOSIS — D1801 Hemangioma of skin and subcutaneous tissue: Secondary | ICD-10-CM

## 2020-11-18 DIAGNOSIS — C220 Liver cell carcinoma: Secondary | ICD-10-CM

## 2020-11-18 DIAGNOSIS — C4402 Squamous cell carcinoma of skin of lip: Secondary | ICD-10-CM

## 2020-11-18 DIAGNOSIS — D229 Melanocytic nevi, unspecified: Secondary | ICD-10-CM

## 2020-11-18 DIAGNOSIS — C801 Malignant (primary) neoplasm, unspecified: Secondary | ICD-10-CM

## 2020-11-18 DIAGNOSIS — L570 Actinic keratosis: Secondary | ICD-10-CM

## 2020-11-18 DIAGNOSIS — J439 Emphysema, unspecified: Secondary | ICD-10-CM

## 2020-11-18 DIAGNOSIS — K219 Gastro-esophageal reflux disease without esophagitis: Secondary | ICD-10-CM

## 2020-11-18 DIAGNOSIS — K7581 Nonalcoholic steatohepatitis (NASH): Secondary | ICD-10-CM

## 2020-11-18 DIAGNOSIS — D849 Immunodeficiency, unspecified: Secondary | ICD-10-CM

## 2020-11-18 DIAGNOSIS — Z8589 Personal history of malignant neoplasm of other organs and systems: Secondary | ICD-10-CM

## 2020-11-18 DIAGNOSIS — R911 Solitary pulmonary nodule: Secondary | ICD-10-CM

## 2020-11-18 NOTE — Progress Notes
Date of Service: 11/18/2020    Subjective:             Sarah Pineda is a 70 y.o. female.    History of Present Illness  New patient, referred by self    # Multiple melanocytic nevi  - Patient has a history of brown and tan spots distributed over the head, trunk, arms and legs.    - These have been present for many years.    - These get darker with sun exposure.    - There is no history of blistering sunburns.   ?  # Hx of squamous cell cancer x2  - upper cutaneous lip removed by Dr. Robby Sermon in 2010  - last one 2012 on nose s/p surgery  ?  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 aspirin EC 81 mg tablet Take 81 mg by mouth daily after lunch. Take with food.    ? azaTHIOprine (IMURAN) 50 mg tablet Take one tablet by mouth at bedtime daily.   ? calcium carbonate/vitamin D2 (CALCIUM + VITAMIN D PO) Take  by mouth twice daily.   ? CONSTULOSE 10 gram/15 mL oral solution TAKE BY MOUTH TWO TO THREE TIMES DAILY (TITRATES TO 2-3 BOWEL MOVEMENTS DAILY)   ? furosemide (LASIX) 20 mg tablet Take one tablet by mouth every morning.   ? omeprazole DR (PRILOSEC) 20 mg capsule Take one capsule by mouth daily before breakfast.   ? ondansetron HCL (ZOFRAN) 8 mg tablet Take one tablet by mouth every 8 hours as needed (nausea and vomiting).   ? oxyCODONE-acetaminophen (PERCOCET) 7.5-325 mg tablet Take 1 tablet by mouth every 12 hours as needed for Pain   ? rifAXIMin (XIFAXAN) 550 mg tablet Take one tablet by mouth every 12 hours.   ? spironolactone (ALDACTONE) 50 mg tablet Take one tablet by mouth daily with breakfast. Take with food.     Vitals:    11/18/20 1422   PainSc: Zero   Weight: 78 kg (172 lb)     Body mass index is 27.81 kg/m?Marland Kitchen     Physical Exam  Areas Examined (all normal unless noted below):  Head/Face  Neck  Chest/breasts/axillae  Back  Abdomen  Buttocks/groin/genitalia   R upper ext  L upper ext  R lower ext  L lower ext    Pertinent findings include:  - Scar at sites of prior squamous cell carcinoma on cutaneous lip and nose are noted with no evidence of recurrence.      - on the back, torso, and extremities are tan and brown macules and papules, ABC negative c/w nevi    - scattered on trunk and back are several tan verrucous stuck on papules c/w SK    - on the nose are erythematous macules with gritty scale     - on the trunk and extremities are several bright red papules c/w cherry angiomas         Assessment and Plan:    # Hx SCC  # Melanocytic nevi  - ABCD negative as documented in physical examination above.  - Continue to monitor.  - Counseled extensively on photoprotection and sun protective behavior (wearing sun protective clothing, wearing broad brimmed hat, applying sunscreen SPF 30+ with  reapplication every 2 hours, seeking shade/avoiding midday sun), ABCDEs of melanoma, and routine self skin examinations. Encouraged to call/contact us with any new/changing lesions/concerns.  - No evidence of recurrence at sites of previous SCCs    # Actinic Keratosis   - Liquid nitrogen for 2 freeze-to-thaw cycles applied as below  - We discussed that these are a marker of sun damage and if remained untreated, may eventually become skin cancer.  - Advised patient that these are associated with history of UV exposure. Counseled on photoprotection and sun protective behavior, signs and symptoms of NMSC, ABCDEs of melanoma. Encouraged to call/contact us with any new/changing lesions/concerns.     DESTRUCTION OF PREMALIGNANT LESIONS, ACTINIC KERATOSIS  Verbal consent obtained.  DIAGNOSIS: Actinic Keratosis.  TYPE OF DESTRUCTION:  Cryotherapy  LOCATION OF LESIONS: nose  NUMBER OF LESIONS TREATED: 1  Wound care instructions provided. Patient tolerated the procedure well.    # Seborrheic keratoses  - Reassured benign, continue to monitor.    # Cherry Angiomas   - Reassured benign, continue to monitor    # Immunosuppression  - This high risk patient on immunosuppressive medication is at risk for development of cutaneous neoplasms which may be more aggressive in the context of decreased immune surveillance. Photoprotection and self skin examinations were advised as were routine skin examinations.   - Patient counseled on photoprotection and sun protective behavior (wearing sun protective clothing, wearing broad brimmed hat, applying sunscreen SPF 30+ with reapplication every 2 hours, seeking shade/avoiding midday sun), signs and symptoms of NMSC, ABCDEs of melanoma, and routine self skin examinations - advised to continue monitoring for new or changing skin lesions and to call us for sooner appointment with any concerns.  - No evidence of concerning lesions on examination today, continue to monitor      RTC 6 months                    In the presence of Argentina Ponder, MD,  I have taken down these notes, Sherrian Divers, Scribe. 11/18/2020 2:43 PM

## 2020-11-29 ENCOUNTER — Encounter: Admit: 2020-11-29 | Discharge: 2020-11-29 | Payer: MEDICARE

## 2020-11-29 ENCOUNTER — Ambulatory Visit: Admit: 2020-11-29 | Discharge: 2020-11-29 | Payer: MEDICARE

## 2020-11-29 DIAGNOSIS — D899 Disorder involving the immune mechanism, unspecified: Secondary | ICD-10-CM

## 2020-11-29 DIAGNOSIS — C801 Malignant (primary) neoplasm, unspecified: Secondary | ICD-10-CM

## 2020-11-29 DIAGNOSIS — C499 Malignant neoplasm of connective and soft tissue, unspecified: Secondary | ICD-10-CM

## 2020-11-29 DIAGNOSIS — M810 Age-related osteoporosis without current pathological fracture: Secondary | ICD-10-CM

## 2020-11-29 DIAGNOSIS — K7581 Nonalcoholic steatohepatitis (NASH): Secondary | ICD-10-CM

## 2020-11-29 DIAGNOSIS — K219 Gastro-esophageal reflux disease without esophagitis: Secondary | ICD-10-CM

## 2020-11-29 DIAGNOSIS — J439 Emphysema, unspecified: Secondary | ICD-10-CM

## 2020-11-29 DIAGNOSIS — K7210 Chronic hepatic failure without coma: Secondary | ICD-10-CM

## 2020-11-29 DIAGNOSIS — R911 Solitary pulmonary nodule: Secondary | ICD-10-CM

## 2020-11-29 DIAGNOSIS — K5790 Diverticulosis of intestine, part unspecified, without perforation or abscess without bleeding: Secondary | ICD-10-CM

## 2020-11-29 DIAGNOSIS — C220 Liver cell carcinoma: Secondary | ICD-10-CM

## 2020-11-29 DIAGNOSIS — C4402 Squamous cell carcinoma of skin of lip: Secondary | ICD-10-CM

## 2020-11-29 DIAGNOSIS — K759 Inflammatory liver disease, unspecified: Secondary | ICD-10-CM

## 2020-11-29 LAB — COMPREHENSIVE METABOLIC PANEL
ALBUMIN: 4.1 g/dL (ref 3.5–5.0)
ALK PHOSPHATASE: 99 U/L (ref 25–110)
ALT: 10 U/L (ref 7–56)
ANION GAP: 9 (ref 3–12)
AST: 18 U/L (ref 7–40)
BLD UREA NITROGEN: 17 mg/dL (ref >60)
CALCIUM: 9.8 mg/dL (ref 8.5–10.6)
CHLORIDE: 100 MMOL/L (ref 98–110)
CO2: 28 MMOL/L (ref 21–30)
EGFR: >60 mL/min (ref >60)
POTASSIUM: 4.6 MMOL/L — ABNORMAL LOW (ref 3.5–5.1)
SODIUM: 137 MMOL/L (ref 137–147)
TOTAL BILIRUBIN: 0.5 mg/dL (ref 0.3–1.2)
TOTAL PROTEIN: 6.9 g/dL (ref 6.0–8.0)

## 2020-11-29 LAB — 25-OH VITAMIN D (D2 + D3): VITAMIN D (25-OH) TOTAL: 36 ng/mL (ref 30–80)

## 2020-11-29 LAB — PARATHYROID HORMONE: PTH HORMONE: 60 pg/mL (ref 10–65)

## 2020-11-29 MED ORDER — DENOSUMAB 60 MG/ML SC SYRG
60 mg | Freq: Once | SUBCUTANEOUS | 0 refills
Start: 2020-11-29 — End: ?

## 2020-11-29 NOTE — Progress Notes
Chief Complaint   Patient presents with   ? Osteoporosis        Date of Service: 11/29/2020    Referring physician:  Erskine Emery, MD  [address]  [address]  [place],  [place] [address]    Sarah Pineda is a 70 y.o. female. DOB: 1950/11/10   MRN#: [id number]    Patient is new to me.      HPI:  Osteoporosis  Diagnosis: Feb 2020, getting worse now.  She is seeing gastroenterologist here.  Risk factors: Autoimmune hepatitis, liver cirrhosis, hepatocellular carcinoma and she has lung mets, early menopause at age 33.  No previous treatment with hormone replacement therapy.  Fractures: None    Secondary contributors:  ? Kidney stones: no  ? Family hx of osteoporosis:  ? Steroid use: not that she knows.     Lifestyle intervention:  ? Exercise:    ? Dietary calcium intake:  has cheese regularly 2-3 pieces daily.  ? Calcium supplement:  carbonate BID  ? Vitamin D supplement: with the calcium supplements.    She has fatigue.        Review of Systems   Constitutional: Positive for malaise/fatigue. Negative for fever.   HENT: Negative for congestion.    Eyes: Negative for blurred vision.   Respiratory: Negative for cough.    Cardiovascular: Negative for chest pain.   Gastrointestinal: Negative for nausea.   Genitourinary: Negative for frequency.   Musculoskeletal: Negative for myalgias.   Skin: Negative for rash.   Neurological: Negative for headaches.   Endo/Heme/Allergies: Does not bruise/bleed easily.   Psychiatric/Behavioral: Negative for depression.       Medical History:   Diagnosis Date   ? Cancer (HCC)     bone/ right leg   ? Diverticulosis    ? Emphysema lung (HCC)    ? GERD (gastroesophageal reflux disease)     Tums PRN   ? Hepatitis    ? Hepatocellular carcinoma (HCC)    ? Immune disorder (HCC)    ? Nonalcoholic steatohepatitis    ? Pulmonary nodule, left    ? 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 Squamous cell cancer of lip      Surgical History:   Procedure Laterality Date   ? AMPUTATION  1970    right leg-bone cancer ? HX TUBAL LIGATION  1977   ? HX CHOLECYSTECTOMY  2004   ? ESOPHAGOGASTRODUODENOSCOPY WITH BIOPSY - FLEXIBLE N/A 05/18/2017    Performed by Virgina Organ, MD at [facility] ENDO   ? COLONOSCOPY DIAGNOSTIC WITH SPECIMEN COLLECTION BY BRUSHING/ WASHING - FLEXIBLE N/A 05/18/2017    Performed by Virgina Organ, MD at [facility] ENDO   ? LUNG SURGERY  07/24/2017    removal of lung wedge   ? left video assisted thoracoscopy with wedge resection Left 07/24/2017    Performed by Bryson Dames, MD at [facility]   ? ESOPHAGOGASTRODUODENOSCOPY WITH SPECIMEN COLLECTION BY BRUSHING/ WASHING N/A 05/23/2019    Performed by Dawna Part, MD at [facility] ENDO   ? BRONCHOSCOPY DIAGNOSTIC WITH/ WITHOUT CELL WASHING - FLEXIBLE N/A 12/05/2019    Performed by Boykin Nearing, Sherryl Manges, MD at [facility] OR   ? BRONCHOSCOPY WITH IMAGE - GUIDED NAVIGATION - FLEXIBLE N/A 12/05/2019    Performed by Boykin Nearing, Sherryl Manges, MD at [facility] OR   ? BRONCHOSCOPY WITH ENDOBRONCHIAL ULTRASOUND GUIDED TRANSTRACHEAL/ TRANSBRONCHIAL SAMPLING - 3 OR MORE MEDIASTINAL/ HILAR LYMPH NODE STATIONS/ STRUCTURE - FLEXIBLE  N/A 12/05/2019    Performed by Boykin Nearing, Maykol Elvera Lennox, MD at [facility] OR   ? BRONCHOSCOPY WITH TRANSBRONCHIAL LUNG BIOPSY - FLEXIBLE - SINGLE LOBE N/A 12/05/2019    Performed by Boykin Nearing, Sherryl Manges, MD at [facility] OR   ? BRONCHOSCOPY WITH TRANSBRONCHIAL NEEDLE ASPIRATION AND BIOPSY TRACHEA/ MAIN STEM/ LOBAR BRONCHUS - FLEXIBLE N/A 12/05/2019    Performed by Boykin Nearing, Maykol Elvera Lennox, MD at [facility] OR   ? BRONCHOSCOPY WITH BRUSHING/ PROTECTED BRUSHING - FLEXIBLE N/A 12/05/2019    Performed by Boykin Nearing, Sherryl Manges, MD at [facility] OR   ? LIVER SURGERY  06/2017, 07/2017    x 2 Ablasion    ? LYMPH NODE BIOPSY      back   ? SKIN CANCER EXCISION      sqc- lip and nose     Family History   Problem Relation Age of Onset   ? Heart Failure Mother    ? Cancer Mother         Stomach   ? Heart Failure Father         bypass   ? Heart Disease Father    ? Stroke Brother         age 15 ? Cancer-Colon Brother    ? Aneurysm Brother    ? Diabetes Brother    ? Abnormal EKG Brother         Afib   ? Cancer Sister         Pancreatic   ? Heart Disease Maternal Grandfather    ? Melanoma Neg Hx      Social History     Socioeconomic History   ? Marital status: Married   Tobacco Use   ? Smoking status: Former Smoker     Packs/day: 0.50     Years: 50.00     Pack years: 25.00     Types: Cigarettes     Quit date: 03/10/2017     Years since quitting: 3.7   ? Smokeless tobacco: Never Used   Vaping Use   ? Vaping Use: Never used   Substance and Sexual Activity   ? Alcohol use: Not Currently     Comment: couple times/year   ? Drug use: Never       aspirin EC  azaTHIOprine  CALCIUM + VITAMIN D PO  CONSTULOSE Soln  furosemide  omeprazole DR  ondansetron HCL  oxyCODONE-acetaminophen  rifAXIMin  spironolactone      Objective:     ? aspirin EC 81 mg tablet Take 81 mg by mouth daily after lunch. Take with food.    ? azaTHIOprine (IMURAN) 50 mg tablet Take one tablet by mouth at bedtime daily.   ? calcium carbonate/vitamin D2 (CALCIUM + VITAMIN D PO) Take  by mouth twice daily.   ? CONSTULOSE 10 gram/15 mL oral solution TAKE BY MOUTH TWO TO THREE TIMES DAILY (TITRATES TO 2-3 BOWEL MOVEMENTS DAILY)   ? furosemide (LASIX) 20 mg tablet Take one tablet by mouth every morning.   ? omeprazole DR (PRILOSEC) 20 mg capsule Take one capsule by mouth daily before breakfast.   ? ondansetron HCL (ZOFRAN) 8 mg tablet Take one tablet by mouth every 8 hours as needed (nausea and vomiting).   ? oxyCODONE-acetaminophen (PERCOCET) 7.5-325 mg tablet Take 1 tablet by mouth every 12 hours as needed for Pain   ? rifAXIMin (XIFAXAN) 550 mg tablet Take one tablet by mouth every 12 hours.   ? spironolactone (ALDACTONE) 50  mg tablet Take one tablet by mouth daily with breakfast. Take with food.     Vitals:    11/29/20 0936   BP: 119/77   BP Source: Arm, Left Upper   Pulse: 86   PainSc: Zero   Weight: 78 kg (172 lb)   Height: 167.5 cm (5' 5.95)     Body mass index is 27.8 kg/m?Marland Kitchen     Physical Exam  Vitals and nursing note reviewed.   Constitutional:       General: She is not in acute distress.     Appearance: Normal appearance. She is not ill-appearing, toxic-appearing or diaphoretic.   HENT:      Head: Normocephalic and atraumatic.      Right Ear: External ear normal.      Left Ear: External ear normal.      Nose: Nose normal. No rhinorrhea.   Eyes:      General:         Right eye: No discharge.         Left eye: No discharge.      Conjunctiva/sclera: Conjunctivae normal.   Pulmonary:      Effort: Pulmonary effort is normal. No respiratory distress.   Abdominal:      General: There is no distension.   Musculoskeletal:         General: No deformity.      Cervical back: Normal range of motion.   Skin:     Coloration: Skin is not jaundiced or pale.   Neurological:      Mental Status: She is alert and oriented to person, place, and time. Mental status is at baseline.      Motor: No weakness.   Psychiatric:         Mood and Affect: Mood normal.         Behavior: Behavior normal.               Comprehensive Metabolic Profile    Lab Results   Component Value Date/Time    NA 133 (L) 11/01/2020 12:06 PM    K 4.3 11/01/2020 12:06 PM    CL 98 11/01/2020 12:06 PM    CO2 27 11/01/2020 12:06 PM    GAP 8 11/01/2020 12:06 PM    BUN 16 11/01/2020 12:06 PM    CR 0.87 11/01/2020 12:06 PM    GLU 89 11/01/2020 12:06 PM    GLU 93 08/20/2017 07:21 AM    Lab Results   Component Value Date/Time    CA 9.8 11/01/2020 12:06 PM    PO4 4.2 07/23/2017 09:36 AM    ALBUMIN 4.2 11/01/2020 12:06 PM    TOTPROT 7.4 11/01/2020 12:06 PM    ALKPHOS 97 11/01/2020 12:06 PM    AST 19 11/01/2020 12:06 PM    ALT 11 11/01/2020 12:06 PM    TOTBILI 0.5 11/01/2020 12:06 PM    GFR 55 (L) 12/15/2019 10:24 AM    GFRAA >60 12/15/2019 10:24 AM        05/2020 DEXA  LUMBAR SPINE, L1-L4   Current: 0.710 g/cm2, T-score of -3.9 ? ?   Previous: 0.733 g/cm2, T-score of -3.7     LEFT FEMORAL NECK ?   Current: 0.659 g/cm2, T-score of -2.7 ? ?   Previous: 0.703 g/cm2, T-score of -2.4     LEFT TOTAL HIP ?   Current: 0.673 g/cm2, T-score of -2.7   Previous: Not available     Assessment and Plan:    Eleyna C.  Bittinger was seen today for osteoporosis.    Diagnoses and all orders for this visit:    Age-related osteoporosis without current pathological fracture  -     25-OH VITAMIN D (D2 + D3); Future; Expected date: 11/29/2020  -     PARATHYROID HORMONE; Future; Expected date: 11/29/2020  -     COMPREHENSIVE METABOLIC PANEL; Future; Expected date: 11/29/2020    Other orders  -     denosumab (PROLIA) injection 60 mg      Worsening Osteoporosis. With Cirrhosis.  Get blood work.  Recommend to start Prolia.  Prolia can cause low calcium after injection.  Please let us know if you experience numbness or tingling.    Prolia can rarely cause osteonecrosis of jaw when patients get dental procedures like extraction or dental implants.  Please let your dentist know that you are on these medicines if you are planning to get any dental procedure.  Very rarely it can cause atypical fracture of femur.  Please let us know if you experience thigh pain at any time.    Change calcium carbonate to citrate 500 mg daily.  Continue cheese.        Electronically signed by Lucrezia Starch, MD 11/29/2020

## 2021-01-21 ENCOUNTER — Encounter: Admit: 2021-01-21 | Discharge: 2021-01-21 | Payer: MEDICARE

## 2021-01-25 ENCOUNTER — Encounter: Admit: 2021-01-25 | Discharge: 2021-01-25 | Payer: MEDICARE

## 2021-01-25 MED ORDER — SPIRONOLACTONE 50 MG PO TAB
50 mg | ORAL_TABLET | Freq: Every day | ORAL | 3 refills | 90.00000 days | Status: AC
Start: 2021-01-25 — End: ?

## 2021-01-26 ENCOUNTER — Encounter: Admit: 2021-01-26 | Discharge: 2021-01-26 | Payer: MEDICARE

## 2021-01-26 DIAGNOSIS — C3412 Malignant neoplasm of upper lobe, left bronchus or lung: Secondary | ICD-10-CM

## 2021-01-28 ENCOUNTER — Encounter: Admit: 2021-01-28 | Discharge: 2021-01-28 | Payer: MEDICARE

## 2021-01-30 NOTE — Progress Notes
[facility]      PATIENT'S NAME: Sarah Pineda        DATE OF BIRTH / AGE: [birth year] / 71 y.o.  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 PD-L1 5-10%.      CHIEF COMPLAINT: Regular follow up.      CURRENT TREATMENT: s/p SBRT       PAST TREATMENT:  - 07/16/17: LUL wedge resection      ONCOLOGY HISTORY:  - Smoking hx: smoked 30 years, 0.5 pack per day on average. quitted for 3 years.  - 07/16/17: Left video assisted thoracoscopy left upper lobe wedge resection by Dr. Derenda Fennel. 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  - 10/22/19: CT?C/A/P: Development of 11 mm irregular pulmonary nodule in the left upper lobe suggesting recurrent pulmonary malignancy or metastasis.  - 10/29/19: PET: LUL nodule max SUV 9.5. 1. Post surgical changes of prior left upper lobe wedge resection with increased uptake about the resection margin, concerning for residual or   recurrent tumor. 2. New pulmonary nodule in the left upper lobe noted on CT of 10/22/2019 demonstrates significant increase FDG uptake, concerning for pulmonary metastasis.   - 11/12/19: PFT showed: FEV1 of 85% predicted and DLCO of 93% predicted   - 12/05/19: EBUS showed negative for mediastinal lymphadenopathy. However previous surgical site is positive for squamous carcinoma. PD-L1 5-10%.  - 01/08/21: s/p SBRT  - 01/26/20: Patient missed appointment with [place].  - 04/19/20: Telehealth to discuss adjuvant treatments.  - 04/26/20: Clinic visit with Med/Onc. Discussed adjuvant chemotherapy and immunotherapy in person. After weighing the benefits and risks, patient opted for NOT receiving these adjuvant treatments.  - 07/30/20: CT chest showed post-RT changes. It also shows Other subtle groundglass opacities in the right lung may reflect infection or drug reaction (in the appropriate clinical setting).   - 10/13/20: CT chest showed post-RT change. However Larger 4 mm left apical nodule which was a previously new punctate nodule on 04/07/2020. This is indeterminate though suspicious for metastasis. Short-term CT chest in 2-3 months is recommended for reevaluation as this is likely too small to biopsy or characterize on PET/CT. New punctate medial right lower lobe nodule which is also indeterminate and can be reevaluated on follow-up CT chest.  01/31/21: CT chest Continued increase in left apical nodule now measuring up to 6 mm, previously new in March 2022. Findings remain suspicious for metastasis given continued growth. Its small size again makes biopsy or PET/CT characterization difficult. Prior left upper lobe wedge resection. Similar bandlike consolidation in the left upper lobe near the suture line, obscuring previously treated left upper lobe nodule and compatible with prior radiation therapy. No enlarging lymph nodes.        INTERVAL HISTORY: Doing well. Breathing is stable. She states this is the best she has felt in some time. She is staying active at home.      PAST MEDICAL HISTORY:  Medical History:   Diagnosis Date   ? Cancer (HCC)     bone/ right leg   ? Diverticulosis    ? Emphysema lung (HCC)    ? GERD (gastroesophageal reflux disease)     Tums PRN   ? Hepatitis    ? Hepatocellular carcinoma (HCC)    ? Immune disorder (HCC)    ?  Nonalcoholic steatohepatitis    ? Pulmonary nodule, left    ? 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 Squamous cell cancer of lip          PAST SURGICAL HISTORY:  Surgical History:   Procedure Laterality Date   ? AMPUTATION  1970    right leg-bone cancer   ? HX TUBAL LIGATION  1977   ? HX CHOLECYSTECTOMY  2004   ? ESOPHAGOGASTRODUODENOSCOPY WITH BIOPSY - FLEXIBLE N/A 05/18/2017    Performed by Virgina Organ, MD at [facility] ENDO   ? COLONOSCOPY DIAGNOSTIC WITH SPECIMEN COLLECTION BY BRUSHING/ WASHING - FLEXIBLE N/A 05/18/2017    Performed by Virgina Organ, MD at [facility] ENDO   ? LUNG SURGERY  07/24/2017    removal of lung wedge   ? left video assisted thoracoscopy with wedge resection Left 07/24/2017    Performed by Bryson Dames, MD at [facility] CVOR   ? ESOPHAGOGASTRODUODENOSCOPY WITH SPECIMEN COLLECTION BY BRUSHING/ WASHING N/A 05/23/2019    Performed by Dawna Part, MD at [facility] ENDO   ? BRONCHOSCOPY DIAGNOSTIC WITH/ WITHOUT CELL WASHING - FLEXIBLE N/A 12/05/2019    Performed by Boykin Nearing, Sherryl Manges, MD at [facility] OR   ? BRONCHOSCOPY WITH IMAGE - GUIDED NAVIGATION - FLEXIBLE N/A 12/05/2019    Performed by Boykin Nearing, Sherryl Manges, MD at [facility] OR   ? BRONCHOSCOPY WITH ENDOBRONCHIAL ULTRASOUND GUIDED TRANSTRACHEAL/ TRANSBRONCHIAL SAMPLING - 3 OR MORE MEDIASTINAL/ HILAR LYMPH NODE STATIONS/ STRUCTURE - FLEXIBLE N/A 12/05/2019    Performed by Boykin Nearing, Maykol Elvera Lennox, MD at [facility] OR   ? BRONCHOSCOPY WITH TRANSBRONCHIAL LUNG BIOPSY - FLEXIBLE - SINGLE LOBE N/A 12/05/2019    Performed by Boykin Nearing, Sherryl Manges, MD at [facility] OR   ? BRONCHOSCOPY WITH TRANSBRONCHIAL NEEDLE ASPIRATION AND BIOPSY TRACHEA/ MAIN STEM/ LOBAR BRONCHUS - FLEXIBLE N/A 12/05/2019    Performed by Boykin Nearing, Maykol Elvera Lennox, MD at [facility] OR   ? BRONCHOSCOPY WITH BRUSHING/ PROTECTED BRUSHING - FLEXIBLE N/A 12/05/2019    Performed by Boykin Nearing, Sherryl Manges, MD at [facility] OR   ? LIVER SURGERY  06/2017, 07/2017    x 2 Ablasion    ? LYMPH NODE BIOPSY      back   ? SKIN CANCER EXCISION      sqc- lip and nose         FAMILY HISTORY:  Family History   Problem Relation Age of Onset   ? Heart Failure Mother    ? Cancer Mother         Stomach   ? Heart Failure Father         bypass   ? Heart Disease Father    ? Stroke Brother         age 28   ? Cancer-Colon Brother    ? Aneurysm Brother    ? Diabetes Brother    ? Abnormal EKG Brother         Afib   ? Cancer Sister         Pancreatic   ? Heart Disease Maternal Grandfather ? Melanoma Neg Hx           SOCIAL HISTORY:  Social History     Socioeconomic History   ? Marital status: Married   Tobacco Use   ? Smoking status: Former     Packs/day: 0.50     Years: 50.00     Pack years: 25.00     Types: Cigarettes  Quit date: 03/10/2017     Years since quitting: 3.8   ? Smokeless tobacco: Never   Vaping Use   ? Vaping Use: Never used   Substance and Sexual Activity   ? Alcohol use: Not Currently     Comment: couple times/year   ? Drug use: Never          ALLERGY:   Allergies   Allergen Reactions   ? Ciprofloxacin BLISTERS   ? Latex RASH and REDNESS     Pt states it tore up her skin          MEDICATION:  Current Outpatient Medications on File Prior to Visit   Medication Sig Dispense Refill   ? aspirin EC 81 mg tablet Take 81 mg by mouth daily after lunch. Take with food.      ? azaTHIOprine (IMURAN) 50 mg tablet Take one tablet by mouth at bedtime daily. 90 tablet 3   ? calcium carbonate/vitamin D2 (CALCIUM + VITAMIN D PO) Take  by mouth twice daily.     ? CONSTULOSE 10 gram/15 mL oral solution TAKE BY MOUTH TWO TO THREE TIMES DAILY (TITRATES TO 2-3 BOWEL MOVEMENTS DAILY) 1896 mL 3   ? furosemide (LASIX) 20 mg tablet Take one tablet by mouth every morning. 90 tablet 3   ? omeprazole DR (PRILOSEC) 20 mg capsule Take one capsule by mouth daily before breakfast. 90 capsule 3   ? ondansetron HCL (ZOFRAN) 8 mg tablet Take one tablet by mouth every 8 hours as needed (nausea and vomiting). 30 tablet 3   ? oxyCODONE-acetaminophen (PERCOCET) 7.5-325 mg tablet Take 1 tablet by mouth every 12 hours as needed for Pain     ? rifAXIMin (XIFAXAN) 550 mg tablet Take one tablet by mouth every 12 hours. 60 tablet 11   ? spironolactone (ALDACTONE) 50 mg tablet Take one tablet by mouth daily with breakfast. Take with food. 90 tablet 3     No current facility-administered medications on file prior to visit.          REVIEW OF SYSTEMS:    Constitutional: No weight loss. No fever or chills  Eyes: No red eyes. No itchy eyes. No double vision. No change on vision acuity    Ear, Nose, Mouth & Throat: No hearing loss. No discharge. No sore throat   Cardiovascular: No chest pain. No palpitations. No syncope  Respiratory: + mild shortness of breath with exertion. + Occasional cough  Gastrointestinal: No nausea or vomiting. No difficulty swallowing. No abdominal pain. No diarrhea or constipation. No heartburn, throat pain or difficulty swallowing.  Genitourinary: No burning urination. No change in the color of urine  Musculoskeletal: No joints pain. No swelling of the upper or lower extremities    Integumentary: No rash.    Neuro:  No headache. No focal weakness or numbness in the upper or lower extremities  Endocrine: No heat or cold intolerance. No excessive sweating  All other systems are negative.       PHYSICAL EXAMINATION:  Vitals:    01/31/21 1116   BP: 120/83   Pulse: 100   Temp: 36.4 ?C (97.6 ?F)   SpO2: 99%       ECOG performance status: 1  ?  Constitutional: Alert and oriented x3.    Eyes: Pupils are equal bilaterally. No redness. No jaundice.   Ear, Nose, Mouth & Throat: Ear canals are patent. No discharge from the nose. Mucous membranes are moist  Cardiovascular: Regular rate and rhythm.  Respiratory: Good air entry bilaterally. No crackles or wheezes.  Gastrointestinal: Soft. Nontender. Nondistended. Good bowel sounds. No hepatosplenomegaly    Musculoskeletal & Extremities: No limitation in the joints' range of motion. No lower extremity edema, + right leg AKA  Neuro:  CN II-XII intact. Strength in the upper and lower extremities: intact. Sensations in the upper and lower extremities: intact    Integumentary: No rash.    Heme/Lymph/Immunology: No cervical, supraclavicular, axillary or inguinal lymphadenopathy      LABS: Reviewed.      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  - Patient is seen by thoracic surgeon Dr. Derenda Fennel and considered not an ideal surgical candidate.  - PET/CT showed increased uptake about the resection margin, which is concerning for residual or recurrent tumor. Case was discussed in the Tumor Board. An EBUS was performed which showed N0, but previous surgical site was positive for squamous carcinoma. Therefore this can be justified for T3 disease and making adjuvant chemotherapy reasonable after SBRT. However I did tell patient that this is an extrapolation of data from patients who got complete surgical resection, and the 5 year survival benefit is marginal. I will also have a discussion with [name] for the appropriateness of adjuvant chemotherapy due to her liver disease.  - She is now s/p SBRT. We re-discussed possible adjuvant chemotherapy and immunotherapy. I presented current data but made it clear that they are all from surgical patients. In addition, patient is 3 months out of SBRT which makes the benefit less clear. 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 Patient expressed understanding. After discussing the benefits and risks of each treatment, patient decided NOT to go for adjuvant treatment. We will therefore start active surveillance.   - Recent scan showed post-RT change. Left apical nodule further increased in size. Refer to RT and repeat another scan in 3 months.  - RTC in 3 months with CBC, CMP after restaging CT chest.      Above A/P were explained in detail to the patient and family who expressed understanding. All the relevant questions were addressed appropriately. Patient and family understand in case of fever (>100.4), worsening chest pain or shortness of breath, or any other new symptoms, our on-call physician can be reached and 911 shall be called in case of emergencies.     ---  Deretha Emory, APRN    CC:  Bryson Dames, MD  Dayton Bailiff, MD  Jason Nest, MD

## 2021-01-31 ENCOUNTER — Encounter: Admit: 2021-01-31 | Discharge: 2021-01-31 | Payer: MEDICARE

## 2021-01-31 DIAGNOSIS — R911 Solitary pulmonary nodule: Secondary | ICD-10-CM

## 2021-01-31 DIAGNOSIS — C3412 Malignant neoplasm of upper lobe, left bronchus or lung: Secondary | ICD-10-CM

## 2021-01-31 DIAGNOSIS — K5790 Diverticulosis of intestine, part unspecified, without perforation or abscess without bleeding: Secondary | ICD-10-CM

## 2021-01-31 DIAGNOSIS — J439 Emphysema, unspecified: Secondary | ICD-10-CM

## 2021-01-31 DIAGNOSIS — K219 Gastro-esophageal reflux disease without esophagitis: Secondary | ICD-10-CM

## 2021-01-31 DIAGNOSIS — D899 Disorder involving the immune mechanism, unspecified: Secondary | ICD-10-CM

## 2021-01-31 DIAGNOSIS — K759 Inflammatory liver disease, unspecified: Secondary | ICD-10-CM

## 2021-01-31 DIAGNOSIS — C801 Malignant (primary) neoplasm, unspecified: Secondary | ICD-10-CM

## 2021-01-31 DIAGNOSIS — C499 Malignant neoplasm of connective and soft tissue, unspecified: Secondary | ICD-10-CM

## 2021-01-31 DIAGNOSIS — K7581 Nonalcoholic steatohepatitis (NASH): Secondary | ICD-10-CM

## 2021-01-31 DIAGNOSIS — C220 Liver cell carcinoma: Secondary | ICD-10-CM

## 2021-01-31 DIAGNOSIS — C4402 Squamous cell carcinoma of skin of lip: Secondary | ICD-10-CM

## 2021-01-31 LAB — CBC AND DIFF
ABSOLUTE BASO COUNT: 0 K/UL (ref 0–0.20)
ABSOLUTE EOS COUNT: 0.2 K/UL (ref 0–0.45)
ABSOLUTE LYMPH COUNT: 0.3 K/UL — ABNORMAL LOW (ref 1.0–4.8)
ABSOLUTE MONO COUNT: 0.3 K/UL (ref 0–0.80)
ABSOLUTE NEUTROPHIL: 5.6 K/UL (ref 1.8–7.0)
BASOPHILS %: 1 % (ref 0–2)
HEMATOCRIT: 33 % — ABNORMAL LOW (ref 36–45)
HEMOGLOBIN: 11 g/dL — ABNORMAL LOW (ref 12.0–15.0)
LYMPHOCYTES %: 4 % — ABNORMAL LOW (ref 24–44)
MCHC: 34 g/dL (ref 32.0–36.0)
MCV: 92 FL (ref 80–100)
MONOCYTES %: 5 % (ref 4–12)
MPV: 8.2 FL (ref 7–11)
RBC COUNT: 3.6 M/UL — ABNORMAL LOW (ref 4.0–5.0)
RDW: 14 % (ref 11–15)
WBC COUNT: 6.5 K/UL (ref 4.5–11.0)

## 2021-01-31 LAB — COMPREHENSIVE METABOLIC PANEL
ALK PHOSPHATASE: 96 U/L (ref 25–110)
CO2: 29 MMOL/L — ABNORMAL HIGH (ref 21–30)
EGFR: >60 mL/min (ref >60)
POTASSIUM: 4.4 MMOL/L (ref 3.5–5.1)
SODIUM: 134 MMOL/L — ABNORMAL LOW (ref 137–147)
TOTAL PROTEIN: 7.3 g/dL (ref 6.0–8.0)

## 2021-01-31 MED ORDER — SODIUM CHLORIDE 0.9 % IJ SOLN
50 mL | Freq: Once | INTRAVENOUS | 0 refills | Status: CP
Start: 2021-01-31 — End: ?

## 2021-01-31 MED ORDER — IOHEXOL 350 MG IODINE/ML IV SOLN
70 mL | Freq: Once | INTRAVENOUS | 0 refills | Status: CP
Start: 2021-01-31 — End: ?

## 2021-02-17 ENCOUNTER — Ambulatory Visit: Admit: 2021-02-17 | Discharge: 2021-02-18 | Payer: MEDICARE

## 2021-02-17 DIAGNOSIS — A048 Other specified bacterial intestinal infections: Secondary | ICD-10-CM

## 2021-02-17 DIAGNOSIS — K7469 Other cirrhosis of liver: Secondary | ICD-10-CM

## 2021-02-17 DIAGNOSIS — C220 Liver cell carcinoma: Secondary | ICD-10-CM

## 2021-02-17 DIAGNOSIS — K754 Autoimmune hepatitis: Secondary | ICD-10-CM

## 2021-02-17 NOTE — Patient Instructions
Schedule:    1. Return to clinic in 9 month with Dr. Lovena Le or Garner Nash APRN In-person Gen Hep.    Patient Information:    1. You will be due for EGD later this year.    2. Continue with imaging of abdomen and chest.    3. Lab orders have been placed for you to complete with your next set. Continue labs every 3 months.    4. We will check a stool sample with your next labs to follow up on the H. Pylori infection.    If you have completed labs or imaging today and have not received results within 10 days, please contact our office.    [facility]  Dr. Lovena Le  [phone]

## 2021-03-02 ENCOUNTER — Ambulatory Visit: Admit: 2021-03-02 | Discharge: 2021-03-02 | Payer: MEDICARE

## 2021-03-02 ENCOUNTER — Encounter: Admit: 2021-03-02 | Discharge: 2021-03-02 | Payer: MEDICARE

## 2021-03-02 DIAGNOSIS — C3492 Malignant neoplasm of unspecified part of left bronchus or lung: Secondary | ICD-10-CM

## 2021-03-17 ENCOUNTER — Encounter: Admit: 2021-03-17 | Discharge: 2021-03-17 | Payer: MEDICARE

## 2021-03-17 DIAGNOSIS — K5790 Diverticulosis of intestine, part unspecified, without perforation or abscess without bleeding: Secondary | ICD-10-CM

## 2021-03-17 DIAGNOSIS — K219 Gastro-esophageal reflux disease without esophagitis: Secondary | ICD-10-CM

## 2021-03-17 DIAGNOSIS — C4402 Squamous cell carcinoma of skin of lip: Secondary | ICD-10-CM

## 2021-03-17 DIAGNOSIS — C220 Liver cell carcinoma: Secondary | ICD-10-CM

## 2021-03-17 DIAGNOSIS — R911 Solitary pulmonary nodule: Secondary | ICD-10-CM

## 2021-03-17 DIAGNOSIS — D899 Disorder involving the immune mechanism, unspecified: Secondary | ICD-10-CM

## 2021-03-17 DIAGNOSIS — J439 Emphysema, unspecified: Secondary | ICD-10-CM

## 2021-03-17 DIAGNOSIS — C801 Malignant (primary) neoplasm, unspecified: Secondary | ICD-10-CM

## 2021-03-17 DIAGNOSIS — K759 Inflammatory liver disease, unspecified: Secondary | ICD-10-CM

## 2021-03-17 DIAGNOSIS — K7581 Nonalcoholic steatohepatitis (NASH): Secondary | ICD-10-CM

## 2021-03-17 DIAGNOSIS — C499 Malignant neoplasm of connective and soft tissue, unspecified: Secondary | ICD-10-CM

## 2021-03-23 ENCOUNTER — Encounter: Admit: 2021-03-23 | Discharge: 2021-03-23 | Payer: MEDICARE

## 2021-03-23 DIAGNOSIS — C220 Liver cell carcinoma: Secondary | ICD-10-CM

## 2021-03-23 DIAGNOSIS — C3412 Malignant neoplasm of upper lobe, left bronchus or lung: Secondary | ICD-10-CM

## 2021-03-23 DIAGNOSIS — J449 Chronic obstructive pulmonary disease, unspecified: Secondary | ICD-10-CM

## 2021-03-23 LAB — CBC AND DIFF
ABSOLUTE EOS COUNT: 0.2 K/UL (ref 0–0.45)
LYMPHOCYTES %: 7 % — ABNORMAL LOW (ref 24–44)
MCH: 31 pg (ref 26–34)
MCHC: 34 g/dL (ref 32.0–36.0)
MPV: 8.3 FL (ref 7–11)
NEUTROPHILS %: 82 % — ABNORMAL HIGH (ref 41–77)
PLATELET COUNT: 204 K/UL (ref 150–400)
RBC COUNT: 3.8 M/UL — ABNORMAL LOW (ref 4.0–5.0)
RDW: 12 % (ref 11–15)
WBC COUNT: 4.9 K/UL (ref 4.5–11.0)

## 2021-03-23 LAB — PROTIME INR (PT)
INR: 1 % — ABNORMAL LOW (ref 0.8–1.2)
PROTIME: 11 s (ref 9.5–14.2)

## 2021-03-23 LAB — ALPHA FETO PROTEIN (AFP): AFP: 4 ng/mL (ref 0.0–15.0)

## 2021-03-23 LAB — COMPREHENSIVE METABOLIC PANEL
POTASSIUM: 4.6 MMOL/L (ref 3.5–5.1)
SODIUM: 135 MMOL/L — ABNORMAL LOW (ref 137–147)

## 2021-03-23 LAB — POC CREATININE, RAD: CREATININE, POC: 1.1 mg/dL — ABNORMAL HIGH (ref 0.4–1.00)

## 2021-03-23 MED ORDER — IOHEXOL 350 MG IODINE/ML IV SOLN
100 mL | Freq: Once | INTRAVENOUS | 0 refills | Status: CP
Start: 2021-03-23 — End: ?

## 2021-03-23 MED ORDER — SODIUM CHLORIDE 0.9 % IJ SOLN
50 mL | Freq: Once | INTRAVENOUS | 0 refills | Status: CP
Start: 2021-03-23 — End: ?

## 2021-03-30 ENCOUNTER — Encounter: Admit: 2021-03-30 | Discharge: 2021-03-30 | Payer: MEDICARE

## 2021-03-31 ENCOUNTER — Encounter: Admit: 2021-03-31 | Discharge: 2021-03-31 | Payer: MEDICARE

## 2021-03-31 NOTE — Progress Notes
The patient case was discussed  at our multidisciplinary tumor board the most recent imaging was compared to previous imaging 6 mm nodular area of enhancement nonspecific could be related to her cancer tumor board recommendation was to follow-up closely in 3 months.  And the tumor board recommendation was to consider follow-up in 3 months.

## 2021-04-04 ENCOUNTER — Encounter: Admit: 2021-04-04 | Discharge: 2021-04-04 | Payer: MEDICARE

## 2021-04-04 DIAGNOSIS — K7581 Nonalcoholic steatohepatitis (NASH): Secondary | ICD-10-CM

## 2021-04-04 DIAGNOSIS — K219 Gastro-esophageal reflux disease without esophagitis: Secondary | ICD-10-CM

## 2021-04-04 MED ORDER — OMEPRAZOLE 20 MG PO CPDR
20 mg | ORAL_CAPSULE | Freq: Every day | ORAL | 3 refills | Status: AC
Start: 2021-04-04 — End: ?

## 2021-04-28 ENCOUNTER — Encounter: Admit: 2021-04-28 | Discharge: 2021-04-28 | Payer: MEDICARE

## 2021-04-28 ENCOUNTER — Ambulatory Visit: Admit: 2021-04-28 | Discharge: 2021-04-29 | Payer: MEDICARE

## 2021-04-28 DIAGNOSIS — D849 Immunodeficiency, unspecified: Secondary | ICD-10-CM

## 2021-04-28 DIAGNOSIS — D1801 Hemangioma of skin and subcutaneous tissue: Secondary | ICD-10-CM

## 2021-04-28 DIAGNOSIS — L821 Other seborrheic keratosis: Secondary | ICD-10-CM

## 2021-04-28 DIAGNOSIS — Z872 Personal history of diseases of the skin and subcutaneous tissue: Secondary | ICD-10-CM

## 2021-04-28 DIAGNOSIS — L814 Other melanin hyperpigmentation: Secondary | ICD-10-CM

## 2021-04-28 DIAGNOSIS — D229 Melanocytic nevi, unspecified: Secondary | ICD-10-CM

## 2021-04-28 DIAGNOSIS — Z86007 Personal history of in-situ neoplasm of skin: Secondary | ICD-10-CM

## 2021-04-28 DIAGNOSIS — L570 Actinic keratosis: Secondary | ICD-10-CM

## 2021-04-28 DIAGNOSIS — L820 Inflamed seborrheic keratosis: Secondary | ICD-10-CM

## 2021-04-28 NOTE — Progress Notes
Date of Service: 04/28/2021    Subjective:             Sarah Pineda is a 71 y.o. female.    History of Present Illness  History reviewed from 10/2020 visitation with Dr. Demetrios Isaacs and is unchanged unless otherwise noted.     # Multiple melanocytic nevi  - Patient has a history of brown and tan spots distributed over the head, trunk, arms and legs.?   - These have been present for many years.?   - These get darker with sun exposure.?   - There is?no history of blistering sunburns.       # Hx of squamous cell cancer?x2  - upper cutaneous lip removed by Dr. Robby Sermon in 2010  - last one 2012 on nose?s/p surgery    #ISK  - Patient has a history of extremely itchy bumps with two ones on the forehead today that are particularly bothersome   ?  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     Review of Systems   Constitutional: Negative for appetite change and unexpected weight change.   Gastrointestinal: Negative for diarrhea, nausea and vomiting.   Skin: Negative for color change, pallor, rash and wound.         Objective:         ? aspirin EC 81 mg tablet Take one tablet by mouth daily after lunch. Take with food.   ? azaTHIOprine (IMURAN) 50 mg tablet Take one tablet by mouth at bedtime daily.   ? calcium carbonate/vitamin D2 (CALCIUM + VITAMIN D PO) Take  by mouth twice daily.   ? CONSTULOSE 10 gram/15 mL oral solution TAKE BY MOUTH TWO TO THREE TIMES DAILY (TITRATES TO 2-3 BOWEL MOVEMENTS DAILY)   ? furosemide (LASIX) 20 mg tablet Take one tablet by mouth every morning.   ? omeprazole DR (PRILOSEC) 20 mg capsule Take one capsule by mouth daily before breakfast.   ? ondansetron HCL (ZOFRAN) 8 mg tablet Take one tablet by mouth every 8 hours as needed (nausea and vomiting).   ? oxyCODONE-acetaminophen (PERCOCET) 7.5-325 mg tablet Take one tablet by mouth every 12 hours as needed for Pain.   ? rifAXIMin (XIFAXAN) 550 mg tablet Take one tablet by mouth every 12 hours.   ? spironolactone (ALDACTONE) 50 mg tablet Take one tablet by mouth daily with breakfast. Take with food.     Vitals:    04/28/21 0911   PainSc: Zero   Weight: 77.6 kg (171 lb)   Height: 167.6 cm (5' 6)     Body mass index is 27.6 kg/m?Marland Kitchen     Physical Exam  Areas Examined (all normal unless noted below):  Head/Face  Neck  Chest/axillae  Back  Abdomen  Buttocks  R upper ext  L upper ext  R lower ext  L lower ext    Pt declines examination of groin/genitals, breasts today.     Pertinent findings include:  General: Alert and Oriented x 3, Well-nourished  Eyes: Normal Conjunctivae, EOMI  Psych: normal mood    Multiple brown and tan evenly pigmented macules are distributed over the examined areas.  All have symmetric similar dermascopic findings with primarily globular and reticular patterns.    Soft, pigmented, stuck-on-appearing papules are distributed over the examined areas.  All have symmetric pebbled dermoscopic findings. x2 on middle forehead with overlying excoriations     Bright red, round to oval, dome-shaped papules distributed over the examined areas.     Scar  at site of prior squamous cell carcinoma is noted with no evidence of recurrence.          Assessment and Plan:  #History of SCC  - No evidence of recurrence.  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   - Reviewed increased risk of developing skin cancers  - Continue routine skin checks    #Irritated Seborrheic Keratoses  - reassurance for benign nature of lesions  - LN2 f/t/f x2 lesions     #Cherry Angiomas  - reassurance of benign nature    #Melanocytic Nevi w/ +Hx Blistering Sunburns  - Reassured examined lesions appear normal  - counseled lesions that change in size, shape, color or are tender warrant further evaluation  - counseled on zinc-based sunscreen SPF 30 or greater  - counseled on wearing wide-brimmed hats and sun protective clothing  - recommend vitamin D3 1000 IU/day  - RTC for new/changing lesions    RTC 6 months         Liquid Nitrogen Procedure Note    Risk and benefits of the above procedure including pain, dyspigmentation, scar, infection, recurrence were discussed with the patient (or legal guardian) in detail, who afterwards decided to proceed with the procedure.    Verbal informed consent given  Diagnosis:  Irritated Seborrheic Keratosis  Body site: see progress note   Number of lesions: x2   Cycle duration: 10 sec  Number or cycles: 2   Wound care instructions given: Yes  Complications:  None  Tolerated well:  Yes  Ambulated from room:  Yes  Duration of procedure: > 

## 2021-04-28 NOTE — Patient Instructions
Vitamin D  - We recommend Vitamin D supplementation after a fatty meal, especially if there is no contraindications such as kidney stones    Moles  --Common melanocytic nevi (moles) tend to be =6 mm in diameter and symmetric with even pigmentation, round or oval shape, regular outline, and sharp, non-fuzzy border.  --Dermal nevi stick out from the skin, but if they are soft they are usually not worrisome.  --Flaky seemingly stuck-on brown bumps are usually benign keratoses, not moles.  --Bright red smooth bumps that do not bleed are usually benign blood vessel lesions (cherry angiomas), not moles.  --Atypical nevi/clinical features of possible melanoma include asymmetry, border irregularities, color variability, and diameter >6 mm.  The earliest sign of melanoma is usually a rapidly growing mole.  --About half of melanoma arises in an existing mole and up to half on normal skin.  --It is normal to get new moles until the age of 7-45 years old.  --Multiple atypical nevi are a marker of increased risk of melanoma. The risk of melanoma depends also upon the total number of nevi, family and/or personal history of melanoma, and sun exposure history.   --It is important to look at your moles once a month.  It may help to follow them with photos such as on your smart phone.  Looking once a month you can notice rapid changes and call if these occur.  --Using sunscreens and sun avoidance will decrease your risk of developing melanoma.  The best sun protection is sun avoidance, including with clothing such as long sleeves and a broad brimmed hat.  The best sunscreens are SPF 30 or above cream based with zinc oxide.  One ounce (shot glass sized) amount is needed for an adult.  It should be reapplied every 2 hours if possible.  --Any tanning bed use will increase your risk of melanoma significantly.    Sun Protection   UPF/SPF rated clothing (gloves, long sleeves, scarves); broad-brimmed hats (NOT ball caps!)   [employer] additive can increase the SPF value of your everyday clothing   Cowboy hats protect from sun; baseball hats don't   Here are several recommended zinc-based sunscreen brands in aplphabetical order (always read the ingredient list, as many brands have multiple varieties of sunscreens and not all are zinc-based)   Cyndia Bent, Coca [employer]a, [employer], Culp, [place], Goddess Garden, Green Screen,  [employer], Forensic scientist, SkinCeuticals, Vanicream   2 shot-glases = whole body    Melanoma Patient Information    Also called malignant melanoma     Skin cancer screening: If you notice a mole that differs from others or one that changes, bleeds, or itches, see a dermatologist.   Melanoma is a type of skin cancer. Anyone can get melanoma. When found early and treated, the cure rate is nearly 100%. Allowed to grow, melanoma can spread to other parts of the body. Melanoma can spread quickly. When melanoma spreads, it can be deadly.Dermatologists believe that the number of deaths from melanoma would be much lower if people:  Knew the warning signs of melanoma.   Learned how to examine their skin for signs of skin cancer.   Took the time to examine their skin.   It's important to take time to look at the moles on your skin because this is a good way to find melanoma early. When checking your skin, you should look for the ABCDEs of melanoma.     ABCDE's of melanoma:  When performing monthly  skin exams for your moles or new moles, remember the ABCDE's of melanoma:    A - Asymmetry. (Concerning if spot is not symmetric)  B - Border. (Irregular border or notched border are concerning)  C - Color. (Multiple colors or changes in color are concerning.)  D - Diameter. (Larger than 6mm, ie, a pencil eraser, is concerning.)  E - Evolution. (An evolving or changing spot is concerning. If new itch, tenderness, or bleeding develop, these are concerning changes too.  See further explanation below:    Melanoma: Signs and symptoms Anyone can get melanoma. It's important to take time to look at the moles on your skin because this is a good way to find melanoma early. When checking your skin, you should look for the ABCDEs of melanoma.     ABCDEs of melanoma     A = Asymmetry  One half is unlike the other half.       B = Border  An irregular, scalloped, or poorly defined border.       C = Color  Is varied from one area to another; has shades of tan, brown or black, or is sometimes white, red, or blue.       D = Diameter  Melanomas usually greater than 6mm (the size of a pencil eraser) when diagnosed, but they can be smaller.       E = Evolving  A mole or skin lesion that looks different from the rest or is changing in size, shape, or color.    !! If you see a mole or new spot on your skin that has any of the ABCDEs, immediately make an appointment to see a dermatologist.    Signs of melanoma  The most common early signs (what you see) of melanoma are:     Growing mole on your skin.   Unusual looking mole on your skin or a mole that does not look like any other mole on your skin (the ugly duckling).   Non-uniform mole (has an odd shape, uneven or uncertain border, different colors).     Symptoms of melanoma  In the early stages, melanoma may not cause any symptoms (what you feel). But sometimes melanoma will:    Itch.    Bleed.    Feel painful.   Many melanomas have these signs and symptoms, but not all. There are different types of melanoma. One type can first appear as a brown or black streak underneath a fingernail or toenail. Melanoma also can look like a bruise that just won't heal.     Who gets melanoma?  Anyone can get melanoma. Most people who get it have light skin, but people who have brown and black skin also get melanoma.   Some people have a higher risk of getting melanoma. These people have the following traits:   Skin    Fair skin (The risk is higher if the person also has red or blond hair and blue or green eyes). Sun-sensitive skin (rarely tans or burns easily).    50-plus moles, large moles, or unusual-looking moles.    If you have had bad sunburns or spent time tanning (sun, tanning beds, or sun lamps), you also have a higher risk of getting melanoma.   Men older than 50 are at a higher risk for developing skin cancers, including melanoma. Learning how to check your skin and getting skin exams can help detect skin cancer.    Family/medical history   Melanoma runs  in the family (parent, child, sibling, cousin, aunt, uncle had melanoma).   You had another skin cancer, but most especially another melanoma.   A weakened immune system.      Research shows that indoor tanning increases a person's melanoma risk by 75%. The risk also may increase if you had breast or thyroid cancer.    More people getting melanoma  Fewer people are getting most types of cancer. Melanoma is different. More people are getting melanoma. Many are white men who are 50 years or older. More young people also are getting melanoma. Melanoma is now the most common cancer among people 19-66 years old. Even teenagers are getting melanoma.    What causes melanoma?  Ultraviolet (UV) radiation is a major contributor in most cases. We get UV radiation from the sun, tanning beds, and sun lamps. Heredity also plays a role. Research shows that if a close blood relative (parent, child, sibling, aunt, uncle) had melanoma, a person has a much greater risk of getting melanoma.     How do dermatologists diagnose melanoma?  To diagnose melanoma, a dermatologist begins by looking at the patient's skin. A dermatologist will carefully examine moles and other suspicious spots. To get a better look, a dermatologist may use a device called a dermoscope.      Vitamin D    Our bodies need vitamin D to build strong and healthy bones. Vitamin D helps the body absorb the calcium that our bones require.     For a healthy person, the recommended daily dietary allowance is 600 international units for people of 56-6 years old, and 800 international units for people older than 71 years. More vitamin D is not better. Higher amounts of vitamin D could be harmful, leading to many health problems such as high blood pressure and kidney damage.     American academy of Dermatology recommending everyone get vitamin D from foods naturally rich in vitamin D, foods and beverages fortified with vitamin D or vitamin D supplements. The foods that contain the greatest amount are fatty fishes such as salmon, tuna and mackerel. Fish liver oil is another good source.     One of the sources to look up vitamin amount is through [employer]. PrankT[employer]. This can help you find out whether you get enough vitamin D from your diet. If you are like many people, you may not be getting your recommended dietary allowance of vitamin D. You may want to change the foods that you eat or take a vitamin D supplements. Before you start taking a vitamin D supplement, talk with your doctor.     Vitamin D is produced in the skin by UV light, but the amount is highly variable and depends on many factors. However, getting vitamin D from the sun or tanning beds can 1) increase your risk of developing skin cancer including melanoma which can be deadly, 2) resulting premature skin aging (wrinkles, age spots, blotchy complexion) and 3) leading to a weakened immune system. Therefore, Teacher, music of Dermatology recommend getting vitamin D safely from foods, beverages and supplements.

## 2021-04-28 NOTE — Progress Notes
ATTESTATION    I personally performed the key portions of the E/M visit, discussed case with resident and concur with resident documentation of history, physical exam, assessment, and treatment plan unless otherwise noted.    I performed cryotherapy today: liquid nitrogen was applied for 10-12 seconds to the skin lesions and the expected blistering or scabbing reaction explained. Pt instructed not to pick at the area(s) and that hypopigmented scars may result from the procedure. Return if lesion fails to fully resolve. Patient tolerated procedure well, no complications.       Staff name:  Georgiann Hahn Date:  04/28/2021

## 2021-05-02 NOTE — Progress Notes
[facility]      PATIENT'S NAME: Sarah Pineda        DATE OF BIRTH / AGE: [birth year] / 71 y.o.  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 PD-L1 5-10%.      CHIEF COMPLAINT: Regular follow up.      CURRENT TREATMENT: s/p SBRT       PAST TREATMENT:  - 07/16/17: LUL wedge resection      ONCOLOGY HISTORY:  - Smoking hx: smoked 30 years, 0.5 pack per day on average. quitted for 3 years.  - 07/16/17: Left video assisted thoracoscopy left upper lobe wedge resection by Dr. Derenda Fennel. 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  - 10/22/19: CT?C/A/P: Development of 11 mm irregular pulmonary nodule in the left upper lobe suggesting recurrent pulmonary malignancy or metastasis.  - 10/29/19: PET: LUL nodule max SUV 9.5. 1. Post surgical changes of prior left upper lobe wedge resection with increased uptake about the resection margin, concerning for residual or   recurrent tumor. 2. New pulmonary nodule in the left upper lobe noted on CT of 10/22/2019 demonstrates significant increase FDG uptake, concerning for pulmonary metastasis.   - 11/12/19: PFT showed: FEV1 of 85% predicted and DLCO of 93% predicted   - 12/05/19: EBUS showed negative for mediastinal lymphadenopathy. However previous surgical site is positive for squamous carcinoma. PD-L1 5-10%.  - 01/08/21: s/p SBRT  - 01/26/20: Patient missed appointment with [place].  - 04/19/20: Telehealth to discuss adjuvant treatments.  - 04/26/20: Clinic visit with Med/Onc. Discussed adjuvant chemotherapy and immunotherapy in person. After weighing the benefits and risks, patient opted for NOT receiving these adjuvant treatments.  - 07/30/20: CT chest showed post-RT changes. It also shows Other subtle groundglass opacities in the right lung may reflect infection or drug reaction (in the appropriate clinical setting).   - 10/13/20: CT chest showed post-RT change. However Larger 4 mm left apical nodule which was a previously new punctate nodule on 04/07/2020. This is indeterminate though suspicious for metastasis. Short-term CT chest in 2-3 months is recommended for reevaluation as this is likely too small to biopsy or characterize on PET/CT. New punctate medial right lower lobe nodule which is also indeterminate and can be reevaluated on follow-up CT chest.  01/31/21: CT chest Continued increase in left apical nodule now measuring up to 6 mm, previously new in March 2022. Findings remain suspicious for metastasis given continued growth. Its small size again makes biopsy or PET/CT characterization difficult. Prior left upper lobe wedge resection. Similar bandlike consolidation in the left upper lobe near the suture line, obscuring previously treated left upper lobe nodule and compatible with prior radiation therapy. No enlarging lymph nodes.   - 03/23/21: CT chest Prior left upper lobe wedge resection. Stable adjacent post radiation fibrosis. Stable 6 mm left upper lobe pulmonary nodule which was new in March 2022. No thoracic lymphadenopathy. ABDOMEN: Previously treated segment 5/6 hepatocellular carcinoma. There is a 6 mm nodular area of hyperenhancement along the medial margin of the treatment bed. This is indeterminant. The largest treated, equivocal. Recommend follow-up in 3 months. Multiple additional small hyperenhancing foci are not significantly changed, collectively LR-3. Cirrhosis and portal hypertension       INTERVAL HISTORY: Doing well. Her daughter reports she feels she is  slightly more short of breath at times but the patient feels she is not. She reports not being as active over the winter. She has been having some left upper chest and back pain that she has been seeing a chiropractor for.       PAST MEDICAL HISTORY:  Medical History:   Diagnosis Date   ? Cancer (HCC)     bone/ right leg   ? Diverticulosis    ? Emphysema lung (HCC)    ? GERD (gastroesophageal reflux disease)     Tums PRN   ? Hepatitis    ? Hepatocellular carcinoma (HCC)    ? Immune disorder (HCC)    ? Nonalcoholic steatohepatitis    ? Pulmonary nodule, left    ? 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 Squamous cell cancer of lip          PAST SURGICAL HISTORY:  Surgical History:   Procedure Laterality Date   ? AMPUTATION  1970    right leg-bone cancer   ? HX TUBAL LIGATION  1977   ? HX CHOLECYSTECTOMY  2004   ? ESOPHAGOGASTRODUODENOSCOPY WITH BIOPSY - FLEXIBLE N/A 05/18/2017    Performed by Virgina Organ, MD at [facility] ENDO   ? COLONOSCOPY DIAGNOSTIC WITH SPECIMEN COLLECTION BY BRUSHING/ WASHING - FLEXIBLE N/A 05/18/2017    Performed by Virgina Organ, MD at [facility] ENDO   ? LUNG SURGERY  07/24/2017    removal of lung wedge   ? left video assisted thoracoscopy with wedge resection Left 07/24/2017    Performed by Bryson Dames, MD at [facility] CVOR   ? ESOPHAGOGASTRODUODENOSCOPY WITH SPECIMEN COLLECTION BY BRUSHING/ WASHING N/A 05/23/2019    Performed by Dawna Part, MD at [facility] ENDO   ? BRONCHOSCOPY DIAGNOSTIC WITH/ WITHOUT CELL WASHING - FLEXIBLE N/A 12/05/2019    Performed by Boykin Nearing, Sherryl Manges, MD at [facility] OR   ? BRONCHOSCOPY WITH IMAGE - GUIDED NAVIGATION - FLEXIBLE N/A 12/05/2019    Performed by Boykin Nearing, Sherryl Manges, MD at [facility] OR   ? BRONCHOSCOPY WITH ENDOBRONCHIAL ULTRASOUND GUIDED TRANSTRACHEAL/ TRANSBRONCHIAL SAMPLING - 3 OR MORE MEDIASTINAL/ HILAR LYMPH NODE STATIONS/ STRUCTURE - FLEXIBLE N/A 12/05/2019    Performed by Boykin Nearing, Maykol Elvera Lennox, MD at [facility] OR   ? BRONCHOSCOPY WITH TRANSBRONCHIAL LUNG BIOPSY - FLEXIBLE - SINGLE LOBE N/A 12/05/2019    Performed by Boykin Nearing, Sherryl Manges, MD at [facility] OR   ? BRONCHOSCOPY WITH TRANSBRONCHIAL NEEDLE ASPIRATION AND BIOPSY TRACHEA/ MAIN STEM/ LOBAR BRONCHUS - FLEXIBLE N/A 12/05/2019 Performed by Boykin Nearing, Maykol Elvera Lennox, MD at [facility] OR   ? BRONCHOSCOPY WITH BRUSHING/ PROTECTED BRUSHING - FLEXIBLE N/A 12/05/2019    Performed by Boykin Nearing, Sherryl Manges, MD at [facility] OR   ? LIVER SURGERY  06/2017, 07/2017    x 2 Ablasion    ? LYMPH NODE BIOPSY      back   ? SKIN CANCER EXCISION      sqc- lip and nose         FAMILY HISTORY:  Family History   Problem Relation Age of Onset   ? Heart Failure Mother    ? Cancer Mother         Stomach   ? Heart Failure Father         bypass   ? Heart Disease Father    ? Stroke Brother         age 54   ? Cancer-Colon Brother    ?  Aneurysm Brother    ? Diabetes Brother    ? Abnormal EKG Brother         Afib   ? Cancer Sister         Pancreatic   ? Heart Disease Maternal Grandfather    ? Melanoma Neg Hx           SOCIAL HISTORY:  Social History     Socioeconomic History   ? Marital status: Married   Tobacco Use   ? Smoking status: Former     Packs/day: 0.50     Years: 50.00     Pack years: 25.00     Types: Cigarettes     Quit date: 03/10/2017     Years since quitting: 4.1   ? Smokeless tobacco: Never   Vaping Use   ? Vaping Use: Never used   Substance and Sexual Activity   ? Alcohol use: Not Currently     Comment: couple times/year   ? Drug use: Never          ALLERGY:   Allergies   Allergen Reactions   ? Ciprofloxacin BLISTERS   ? Latex RASH and REDNESS     Pt states it tore up her skin          MEDICATION:  Current Outpatient Medications on File Prior to Visit   Medication Sig Dispense Refill   ? aspirin EC 81 mg tablet Take one tablet by mouth daily after lunch. Take with food.     ? azaTHIOprine (IMURAN) 50 mg tablet Take one tablet by mouth at bedtime daily. 90 tablet 3   ? calcium carbonate/vitamin D2 (CALCIUM + VITAMIN D PO) Take  by mouth twice daily.     ? CONSTULOSE 10 gram/15 mL oral solution TAKE BY MOUTH TWO TO THREE TIMES DAILY (TITRATES TO 2-3 BOWEL MOVEMENTS DAILY) 1896 mL 3   ? furosemide (LASIX) 20 mg tablet Take one tablet by mouth every morning. 90 tablet 3   ? omeprazole DR (PRILOSEC) 20 mg capsule Take one capsule by mouth daily before breakfast. 90 capsule 3   ? ondansetron HCL (ZOFRAN) 8 mg tablet Take one tablet by mouth every 8 hours as needed (nausea and vomiting). 30 tablet 3   ? oxyCODONE-acetaminophen (PERCOCET) 7.5-325 mg tablet Take one tablet by mouth every 12 hours as needed for Pain.     ? rifAXIMin (XIFAXAN) 550 mg tablet Take one tablet by mouth every 12 hours. 60 tablet 11   ? spironolactone (ALDACTONE) 50 mg tablet Take one tablet by mouth daily with breakfast. Take with food. 90 tablet 3     No current facility-administered medications on file prior to visit.          REVIEW OF SYSTEMS:    Constitutional: No weight loss. No fever or chills  Eyes: No red eyes. No itchy eyes. No double vision. No change on vision acuity    Ear, Nose, Mouth & Throat: No hearing loss. No discharge. No sore throat   Cardiovascular: No chest pain. No palpitations. No syncope  Respiratory: + mild shortness of breath with exertion. + Occasional cough  Gastrointestinal: No nausea or vomiting. No difficulty swallowing. No abdominal pain. No diarrhea or constipation. No heartburn, throat pain or difficulty swallowing.  Genitourinary: No burning urination. No change in the color of urine  Musculoskeletal: No joints pain. No swelling of the upper or lower extremities    Integumentary: No rash.    Neuro:  No headache.  No focal weakness or numbness in the upper or lower extremities  Endocrine: No heat or cold intolerance. No excessive sweating  All other systems are negative.       PHYSICAL EXAMINATION:  Vitals:    05/06/21 1254   BP: 126/71   Pulse: 84   Temp: 36.3 ?C (97.3 ?F)   Resp: 16   SpO2: 96%       ECOG performance status: 1  ?  Constitutional: Alert and oriented x3.    Eyes: Pupils are equal bilaterally. No redness. No jaundice.   Ear, Nose, Mouth & Throat: Ear canals are patent. No discharge from the nose. Mucous membranes are moist  Cardiovascular: Regular rate and rhythm.    Respiratory: Good air entry bilaterally. No crackles or wheezes.  Gastrointestinal: Soft. Nontender. Nondistended. Good bowel sounds. No hepatosplenomegaly    Musculoskeletal & Extremities: No limitation in the joints' range of motion. No lower extremity edema, + right leg AKA  Neuro:  CN II-XII intact. Strength in the upper and lower extremities: intact. Sensations in the upper and lower extremities: intact    Integumentary: No rash.    Heme/Lymph/Immunology: No cervical, supraclavicular, axillary or inguinal lymphadenopathy      LABS: Reviewed.      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  - Patient is seen by thoracic surgeon Dr. Derenda Fennel and considered not an ideal surgical candidate.  - PET/CT showed increased uptake about the resection margin, which is concerning for residual or recurrent tumor. Case was discussed in the Tumor Board. An EBUS was performed which showed N0, but previous surgical site was positive for squamous carcinoma. Therefore this can be justified for T3 disease and making adjuvant chemotherapy reasonable after SBRT. However I did tell patient that this is an extrapolation of data from patients who got complete surgical resection, and the 5 year survival benefit is marginal. I will also have a discussion with [name] for the appropriateness of adjuvant chemotherapy due to her liver disease.  - Now s/p SBRT. We re-discussed possible adjuvant chemotherapy and immunotherapy. I presented current data but made it clear that they are all from surgical patients. In addition, patient is 3 months out of SBRT which makes the benefit less clear. 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 Patient expressed understanding. After discussing the benefits and risks of each treatment, patient decided NOT to go for adjuvant treatment. Started active surveillance.   - Left upper anterior and posterior chest and back pain. She is unsure what caused this but thinks it may have been from moving incorrectly. Currently being managed by her chiropractor. She will reach out should this not improve.   - Recent scan showed post-RT change. Stable 6mm nodule in the left upper lobe. Continue active surveillance.  - RTC in 3 months with Telehealth visit d/t distance and with CBC, CMP after restaging CT chest.      Above A/P were explained in detail to the patient and family who expressed understanding. All the relevant questions were addressed appropriately. Patient and family understand in case of fever (>100.4), worsening chest pain or shortness of breath, or any other new symptoms, our on-call physician can be reached and 911 shall be called in case of emergencies.     ---  Deretha Emory, APRN    CC:  Bryson Dames, MD  Dayton Bailiff, MD  Jason Nest, MD

## 2021-05-04 ENCOUNTER — Encounter: Admit: 2021-05-04 | Discharge: 2021-05-04 | Payer: MEDICARE

## 2021-05-06 ENCOUNTER — Encounter: Admit: 2021-05-06 | Discharge: 2021-05-06 | Payer: MEDICARE

## 2021-05-06 DIAGNOSIS — C4402 Squamous cell carcinoma of skin of lip: Secondary | ICD-10-CM

## 2021-05-06 DIAGNOSIS — C499 Malignant neoplasm of connective and soft tissue, unspecified: Secondary | ICD-10-CM

## 2021-05-06 DIAGNOSIS — K5790 Diverticulosis of intestine, part unspecified, without perforation or abscess without bleeding: Secondary | ICD-10-CM

## 2021-05-06 DIAGNOSIS — C220 Liver cell carcinoma: Secondary | ICD-10-CM

## 2021-05-06 DIAGNOSIS — J439 Emphysema, unspecified: Secondary | ICD-10-CM

## 2021-05-06 DIAGNOSIS — D899 Disorder involving the immune mechanism, unspecified: Secondary | ICD-10-CM

## 2021-05-06 DIAGNOSIS — K759 Inflammatory liver disease, unspecified: Secondary | ICD-10-CM

## 2021-05-06 DIAGNOSIS — C3412 Malignant neoplasm of upper lobe, left bronchus or lung: Secondary | ICD-10-CM

## 2021-05-06 DIAGNOSIS — K7581 Nonalcoholic steatohepatitis (NASH): Secondary | ICD-10-CM

## 2021-05-06 DIAGNOSIS — K219 Gastro-esophageal reflux disease without esophagitis: Secondary | ICD-10-CM

## 2021-05-06 DIAGNOSIS — C3492 Malignant neoplasm of unspecified part of left bronchus or lung: Secondary | ICD-10-CM

## 2021-05-06 DIAGNOSIS — C801 Malignant (primary) neoplasm, unspecified: Secondary | ICD-10-CM

## 2021-05-06 DIAGNOSIS — R911 Solitary pulmonary nodule: Secondary | ICD-10-CM

## 2021-05-06 LAB — COMPREHENSIVE METABOLIC PANEL
ALBUMIN: 4.2 g/dL (ref 3.5–5.0)
EGFR: >60 mL/min (ref >60)
GLUCOSE,PANEL: 95 mg/dL — ABNORMAL LOW (ref 70–100)
SODIUM: 133 MMOL/L — ABNORMAL LOW (ref 137–147)

## 2021-05-06 LAB — CBC AND DIFF
ABSOLUTE BASO COUNT: 0 K/UL (ref 0–0.20)
ABSOLUTE EOS COUNT: 0.2 K/UL (ref 0–0.45)
ABSOLUTE LYMPH COUNT: 0.4 K/UL — ABNORMAL LOW (ref 1.0–4.8)
ABSOLUTE MONO COUNT: 0.3 K/UL (ref 0–0.80)
ABSOLUTE NEUTROPHIL: 5 K/UL (ref 1.8–7.0)
EOSINOPHILS %: 3 % (ref 0–5)
HEMATOCRIT: 33 % — ABNORMAL LOW (ref 36–45)
LYMPHOCYTES %: 7 % — ABNORMAL LOW (ref 24–44)
MCH: 32 pg (ref 26–34)
MCHC: 35 g/dL (ref 32.0–36.0)
MCV: 91 FL (ref 80–100)
MONOCYTES %: 6 % (ref 4–12)
MPV: 8.8 FL (ref 7–11)
NEUTROPHILS %: 84 % — ABNORMAL HIGH (ref 41–77)
RBC COUNT: 3.7 M/UL — ABNORMAL LOW (ref 4.0–5.0)
RDW: 13 % (ref 11–15)
WBC COUNT: 5.9 K/UL (ref 4.5–11.0)

## 2021-05-28 ENCOUNTER — Encounter: Admit: 2021-05-28 | Discharge: 2021-05-28 | Payer: MEDICARE

## 2021-05-28 MED ORDER — XIFAXAN 550 MG PO TAB
ORAL_TABLET | 0 refills
Start: 2021-05-28 — End: ?

## 2021-07-06 ENCOUNTER — Ambulatory Visit: Admit: 2021-07-06 | Discharge: 2021-07-06 | Payer: MEDICARE

## 2021-07-06 ENCOUNTER — Encounter: Admit: 2021-07-06 | Discharge: 2021-07-06 | Payer: MEDICARE

## 2021-07-06 DIAGNOSIS — K759 Inflammatory liver disease, unspecified: Secondary | ICD-10-CM

## 2021-07-06 DIAGNOSIS — C220 Liver cell carcinoma: Secondary | ICD-10-CM

## 2021-07-06 DIAGNOSIS — J449 Chronic obstructive pulmonary disease, unspecified: Secondary | ICD-10-CM

## 2021-07-06 DIAGNOSIS — C3492 Malignant neoplasm of unspecified part of left bronchus or lung: Secondary | ICD-10-CM

## 2021-07-06 DIAGNOSIS — J439 Emphysema, unspecified: Secondary | ICD-10-CM

## 2021-07-06 DIAGNOSIS — C801 Malignant (primary) neoplasm, unspecified: Secondary | ICD-10-CM

## 2021-07-06 DIAGNOSIS — D899 Disorder involving the immune mechanism, unspecified: Secondary | ICD-10-CM

## 2021-07-06 DIAGNOSIS — C499 Malignant neoplasm of connective and soft tissue, unspecified: Secondary | ICD-10-CM

## 2021-07-06 DIAGNOSIS — K219 Gastro-esophageal reflux disease without esophagitis: Secondary | ICD-10-CM

## 2021-07-06 DIAGNOSIS — C3412 Malignant neoplasm of upper lobe, left bronchus or lung: Secondary | ICD-10-CM

## 2021-07-06 DIAGNOSIS — K5790 Diverticulosis of intestine, part unspecified, without perforation or abscess without bleeding: Secondary | ICD-10-CM

## 2021-07-06 DIAGNOSIS — C4402 Squamous cell carcinoma of skin of lip: Secondary | ICD-10-CM

## 2021-07-06 DIAGNOSIS — K7581 Nonalcoholic steatohepatitis (NASH): Secondary | ICD-10-CM

## 2021-07-06 DIAGNOSIS — K754 Autoimmune hepatitis: Secondary | ICD-10-CM

## 2021-07-06 DIAGNOSIS — R911 Solitary pulmonary nodule: Secondary | ICD-10-CM

## 2021-07-06 LAB — CBC AND DIFF
HEMOGLOBIN: 12 g/dL — ABNORMAL HIGH (ref 12.0–15.0)
LYMPHOCYTES %: 6 % — ABNORMAL LOW (ref 24–44)
MCHC: 35 g/dL (ref 32.0–36.0)
MCV: 91 FL (ref 80–100)
MPV: 9 FL (ref 7–11)
NEUTROPHILS %: 87 % — ABNORMAL HIGH (ref 41–77)
PLATELET COUNT: 209 K/UL (ref 150–400)
RBC COUNT: 3.7 M/UL — ABNORMAL LOW (ref 4.0–5.0)
RDW: 13 % (ref 11–15)
WBC COUNT: 6.4 K/UL (ref 4.5–11.0)

## 2021-07-06 LAB — PROTIME INR (PT): PROTIME: 11 s (ref 9.5–14.2)

## 2021-07-06 LAB — ALPHA FETO PROTEIN (AFP): AFP: 9.2 ng/mL (ref 0.0–15.0)

## 2021-07-06 LAB — COMPREHENSIVE METABOLIC PANEL: SODIUM: 132 MMOL/L — ABNORMAL LOW (ref 137–147)

## 2021-07-06 LAB — POC CREATININE, RAD: CREATININE, POC: 1 mg/dL — ABNORMAL HIGH (ref 0.4–1.00)

## 2021-07-06 MED ORDER — IOHEXOL 350 MG IODINE/ML IV SOLN
100 mL | Freq: Once | INTRAVENOUS | 0 refills | Status: CP
Start: 2021-07-06 — End: ?
  Administered 2021-07-06: 15:00:00 100 mL via INTRAVENOUS

## 2021-07-06 MED ORDER — SODIUM CHLORIDE 0.9 % IJ SOLN
50 mL | Freq: Once | INTRAVENOUS | 0 refills | Status: CP
Start: 2021-07-06 — End: ?
  Administered 2021-07-06: 15:00:00 50 mL via INTRAVENOUS

## 2021-07-06 NOTE — Progress Notes
Name: Sarah Pineda          MRN: [id number]      DOB: 04/15/1950      AGE: 71 y.o.   DATE OF SERVICE: 07/06/2021    Subjective:             Reason for Visit:  No chief complaint on file.      Sarah Pineda is a 71 y.o. female.      Cancer Staging   Hepatocellular carcinoma (HCC)  Staging form: Liver, AJCC 8th Edition  - Clinical: cT1, cN0, cM0 - Signed by Dayton Bailiff, MD on 06/06/2017    Non-small cell lung cancer (HCC)  Staging form: Lung, AJCC 8th Edition  - Pathologic: Stage IA2 (pT1b, pN0, cM0) - Signed by Dayton Bailiff, MD on 08/03/2017  - Pathologic: No stage assigned - Unsigned        History of Present Illness     [name] doing really well.  No new complaints.  Denies any ascites.  Denies any encephalopathy.  Denies any bleeding per rectum.  Denies any shortness of breath or cough.  She is here for routine scans and labs.  Patient reported having left shoulder pain and was evaluated by PCP with an x-ray which did not show any fracture.  She reported having worsening shoulder pain and back pain on using crutches.     Review of Systems   Constitutional: Negative for fatigue, fever and unexpected weight change.   HENT: Negative for facial swelling, mouth sores, sore throat, tinnitus, trouble swallowing and voice change.    Respiratory: Negative for cough, choking, chest tightness, shortness of breath and wheezing.    Cardiovascular: Negative for chest pain, palpitations and leg swelling.   Gastrointestinal: Negative for abdominal distention, abdominal pain, anal bleeding, blood in stool, constipation, diarrhea, nausea, rectal pain and vomiting.   Genitourinary: Negative for difficulty urinating, dysuria, frequency and hematuria.   Musculoskeletal: Negative for arthralgias, back pain, gait problem, joint swelling, myalgias and neck pain.   Skin: Negative for pallor, rash and wound.   Neurological: Negative for dizziness, seizures, syncope, speech difficulty, weakness, light-headedness, numbness and headaches.   Hematological: Negative for adenopathy. Does not bruise/bleed easily.   Psychiatric/Behavioral: Negative for dysphoric mood and sleep disturbance. The patient is not nervous/anxious.          Objective:         ? aspirin EC 81 mg tablet Take one tablet by mouth daily after lunch. Take with food.   ? azaTHIOprine (IMURAN) 50 mg tablet Take one tablet by mouth at bedtime daily.   ? calcium citrate/vitamin D3 (CALCIUM CITRATE + D PO) Take 600 mg by mouth twice daily.   ? CONSTULOSE 10 gram/15 mL oral solution TAKE BY MOUTH TWO TO THREE TIMES DAILY (TITRATES TO 2-3 BOWEL MOVEMENTS DAILY)   ? furosemide (LASIX) 20 mg tablet Take one tablet by mouth every morning.   ? omeprazole DR (PRILOSEC) 20 mg capsule Take one capsule by mouth daily before breakfast.   ? ondansetron HCL (ZOFRAN) 8 mg tablet Take one tablet by mouth every 8 hours as needed (nausea and vomiting).   ? oxyCODONE-acetaminophen (PERCOCET) 7.5-325 mg tablet Take one tablet by mouth every 12 hours as needed for Pain.   ? spironolactone (ALDACTONE) 50 mg tablet Take one tablet by mouth daily with breakfast. Take with food.   ? XIFAXAN 550 mg tablet TAKE 1 TABLET BY MOUTH EVERY 12 HOURS     Vitals:  07/06/21 1258   BP: 114/69   BP Source: Arm, Left Upper   Pulse: 55   Temp: 36.2 ?C (97.1 ?F)   Resp: 16   SpO2: 100%   O2 Device: None (Room air)   TempSrc: Temporal   PainSc: Zero   Weight: 79.2 kg (174 lb 9.6 oz)     Body mass index is 28.18 kg/m?Marland Kitchen     Pain Score: Zero       Fatigue Scale: 0-None    Pain Addressed:  N/A    Patient Evaluated for a Clinical Trial: No treatment clinical trial available for this patient.     [place] Cooperative Oncology Group performance status is 1, Restricted in physically strenuous activity but ambulatory and able to carry out work of a light or sedentary nature, e.g., light house work, office work.     Physical Exam  Constitutional:       General: She is not in acute distress.     Appearance: Normal appearance. She is well-developed.   HENT:      Head: Normocephalic and atraumatic.   Eyes:      General: No scleral icterus.        Right eye: No discharge.      Pupils: Pupils are equal, round, and reactive to light.   Neck:      Thyroid: No thyromegaly.   Cardiovascular:      Rate and Rhythm: Normal rate and regular rhythm.      Heart sounds: Normal heart sounds. No murmur heard.     No friction rub.   Pulmonary:      Effort: No respiratory distress.      Breath sounds: Normal breath sounds. No wheezing or rales.   Abdominal:      General: Bowel sounds are normal. There is no distension.      Palpations: Abdomen is soft. There is no hepatomegaly, splenomegaly or mass.      Tenderness: There is no abdominal tenderness. There is no guarding or rebound.      Hernia: No hernia is present.   Musculoskeletal:         General: No tenderness.      Cervical back: Full passive range of motion without pain, normal range of motion and neck supple.      Comments: Right lower limb amputation   Lymphadenopathy:      Head:      Right side of head: No submental, submandibular, tonsillar, preauricular, posterior auricular or occipital adenopathy.      Left side of head: No submental, submandibular, tonsillar, preauricular, posterior auricular or occipital adenopathy.      Cervical: No cervical adenopathy.      Upper Body:      Right upper body: No supraclavicular adenopathy.      Left upper body: No supraclavicular adenopathy.   Skin:     General: Skin is warm.      Findings: No petechiae or rash. Rash is not purpuric.   Neurological:      Mental Status: She is alert and oriented to person, place, and time.      Cranial Nerves: No cranial nerve deficit.      Sensory: No sensory deficit.      Coordination: Coordination normal.      Deep Tendon Reflexes: Reflexes are normal and symmetric.   Psychiatric:         Mood and Affect: Mood is not anxious or depressed.  Behavior: Behavior normal.         Thought Content: Thought content normal.         Judgment: Judgment normal.        Latest Reference Range & Units 07/06/21 12:07   Hemoglobin 12.0 - 15.0 GM/DL 91.4   Hematocrit 36 - 45 % 34.4 (L)   Platelet Count 150 - 400 K/UL 209   White Blood Cells 4.5 - 11.0 K/UL 6.4   Neutrophils 41 - 77 % 87 (H)   Absolute Neutrophil Count 1.8 - 7.0 K/UL 5.60   Lymphocytes 24 - 44 % 6 (L)   Absolute Lymph Count 1.0 - 4.8 K/UL 0.40 (L)   Monocytes 4 - 12 % 4   Absolute Monocyte Count 0 - 0.80 K/UL 0.30   Eosinophils 0 - 5 % 3   Absolute Eosinophil Count 0 - 0.45 K/UL 0.20   Absolute Basophil Count 0 - 0.20 K/UL 0.00   Basophils 0 - 2 % 0   RBC 4.0 - 5.0 M/UL 3.78 (L)   MCV 80 - 100 FL 91.0   MCH 26 - 34 PG 31.9   MCHC 32.0 - 36.0 G/DL 78.2   MPV 7 - 11 FL 9.0   RDW 11 - 15 % 13.8   Protime 9.5 - 14.2 SEC 11.9   INR 0.8 - 1.2  1.1   Sodium 137 - 147 MMOL/L 132 (L)   Potassium 3.5 - 5.1 MMOL/L 4.2   Chloride 98 - 110 MMOL/L 100   CO2 21 - 30 MMOL/L 24   Anion Gap 3 - 12  8   Blood Urea Nitrogen 7 - 25 MG/DL 17   Creatinine 0.4 - 9.56 MG/DL 2.13   eGFR >08 mL/min >60   Glucose 70 - 100 MG/DL 657 (H)   Albumin 3.5 - 5.0 G/DL 4.0   Calcium 8.5 - 84.6 MG/DL 9.4   Total Bilirubin 0.3 - 1.2 MG/DL 0.4   Total Protein 6.0 - 8.0 G/DL 7.5   AST (SGOT) 7 - 40 U/L 23   ALT (SGPT) 7 - 56 U/L 17   Alk Phosphatase 25 - 110 U/L 102   Alpha Feto Protein 0.0 - 15.0 NG/ML 9.2   (L): Data is abnormally low  (H): Data is abnormally high       CT chest abdomen 07/06/2021  CT CHEST AND ABDOMEN     Clinical Indication: ?Hepatocellular carcinoma. Surveillance.     Technique: Multiple contiguous axial images were obtained through the   chest and abdomen following the administration of IV contrast material.   Noncontrast as well as hepatic arterial, portal venous and delayed imaging   was obtained through the abdomen. Post processing coronal and sagittal   reconstruction images were made from the axial images. ?     IV contrast: Yes   Bowel contrast: ?None     Comparison: Several prior exams, including the most recent CT chest and   abdomen March 23, 2021 as well as the most recent PET October 29, 2019     CHEST FINDINGS:     Lower Neck: Unremarkable     Axilla, Mediastinum, and Hila: Unremarkable.     Heart and Great Vessels: Normal size heart. Moderate calcific coronary   artery disease. No pericardial effusion.     Airway, Lungs, and Pleura: Prior left upper lobe wedge resection with   unchanged adjacent scarring. Increase in size of the nodule medially   within the right lung apex that  now measures 10 mm, previously 6 mm   (series 103 image 9). Increase in size of the nodule laterally within the   left lower lobe measuring 8 mm, previously 4 mm (series 103 image 49).   Couple sub-5 mm nodules laterally within the right lung apex are both   unchanged (series 103 images 13 and 18). No pleural effusion.     Chest Wall and Osseous Structures: Increase in heterogeneous sclerosis   involving the lateral left 3rd rib and increased in mild surrounding soft   tissue thickening that likely represents a healing rib fracture (series   103 image 34). Mild thoracic spondylosis. Diffuse osteopenia.     ABDOMEN FINDINGS:     Liver and Biliary system: Unchanged size and morphology of the cirrhotic   liver. Portal veins and hepatic veins are all patent. Prior   cholecystectomy.     * ?Segment 5-6 observation: (Series 3 image 116). Previously treated   lesion. Increase in size of a nodular area of arterial phase   hyperenhancement along the anterior margin that also has associated   washout, now measuring 14 mm, previously 6 mm. LI-RADS treated viable.     * ?Multiple additional tiny subcentimeter areas of ill-defined arterial   phase hyperenhancement scattered throughout both hepatic lobes again not   significantly changed. There is no definite washout. LI-RADS 3.     Spleen: Unremarkable.     Adrenal Glands and Kidneys: Normal adrenal glands. No hydronephrosis.   Small left renal cyst. Couple punctate nonobstructing left renal calculi.     Pancreas and Retroperitoneum: Mild fatty infiltration of the pancreas. No   retroperitoneal lymphadenopathy.     Aorta and Major Vessels: Subtle fusiform ectasia of the infrarenal   abdominal aorta. Moderate atherosclerosis. Patent stent within the common   hepatic artery. At least mild if not moderate stenosis of the common   hepatic artery at its origin just proximal to the stent. Right common   iliac and visualized internal and external iliac arteries are   asymmetrically small compared to the left, which is unchanged.     Bowel, Mesentery, and Peritoneal space: Mildly dilated small bowel loops   are present that likely represents a mild ileus. There is loculated gas   within the mesentery lateral to the visualized portion of the cecum in the   right lower quadrant (series 3 image 170). Appendix is normal. No ascites.     Abdominal Wall and Osseous Structures: Diffuse osteopenia.     IMPRESSION   CHEST:   1. Increase in size of a couple left lung nodules that most likely   represent metastases. Couple sub-5 mm right lung nodules are unchanged.   2. Prior left upper lobe wedge resection with unchanged adjacent fibrosis.   3. Healing fracture deformity of the lateral left 3rd rib ?     ABDOMEN:   1. Previously treated hepatic segment 5-6 observation with increase in   nodular enhancement along the anterior margin. LI-RADS treated nonviable.   2. Multiple additional tiny subcentimeter arterial phase hyperenhancing   observations are unchanged. LI-RADS 3   3. Loculated mesenteric gas adjacent to the cecum is benign and has been   present on multiple prior imaging that has included this region, including   imaging back to at least February 2021.          Assessment and Plan:  Problem List        GASTROINTESTINAL AND ABDOMINAL    Autoimmune hepatitis (HCC)  HEMATOLOGY AND NEOPLASIA    Hepatocellular carcinoma (HCC)    Overview       ?  She was admitted to the hospital in 02/2017 with decompensated liver failure. Her MELD at that time was 27. She is currently still on prednisone taper with plans to start azathioprine soon  ?  ?  ?  03/14/17 - AFP = 22.4 and 05/09/17 = 8.3  ?  03/15/17 - MRI ABD impression Redemonstration of 1.7 cm LI RADS 3 observation in segment 5/6 of the liver with arterial enhancement. No appreciable washout or additional significant signal abnormality.  Heterogeneous perfusion pattern throughout the liver without additional discrete mass. Follow-up CT or MRI abdomen (liver protocol) in 3 months recommended. Cirrhosis with small portosystemic varices. No splenomegaly or ascites. ?  ?  03/15/17 Liver biopsy  SURG PATH #: [id number]  A.  Chronic hepatitis with moderate to severe interface and lobular activity with lymphocytic and lympho- plasmacytic infiltrates in the portal tracts and extending into the lobule and areas of confluent hepatocyte necrosis/collapse, compatible with ? ? autoimmune hepatitis. ?   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   See comment.   ?  03/29/17 - Liver biopsy  SURG PATH #: [id number]  A. Liver, mass, biopsy: ? Negative for mass/lesion.  ?  4//8/19 Liver biopsy SURG PATH #: [id number] A. Liver mass, biopsy: Well differentiated hepatocellular carcinoma. ? ?   ?  05/30/17 mri pelvis IMPRESSION ?Three soft tissue nodules within the right pelvis that are unchanged since February 2019. ?These most likely represent metastases from the reported sarcoma. ?Consider biopsy of the largest nodule, as clinically indicated.      ?05/31/17 US Guided bx of the target right gluteal soft tissue nodule  ?Successful ultrasound guided biopsy of the target right gluteal soft tissue mass. ?Four core biopsy specimens submitted to pathology in formalin  ?  05/31/17 PATHOLOGY SURG PATH #: [id number] Diagnosis:  A. Nerve bundles, pelvic lymph node biopsy, core needle biopsy: ? Abundant nerve bundles. See comment.        She is to also have MWA along with TACE for LI-Rads 5 lesion -   08/22/17 Successful CT -guided Microwave Ablation of hepatic segment 5/6 lesion  02/07/19 Successful CT -guided Microwave Ablation of segment 6 liver tumor.               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 Lung, left upper lobe wedge mass, wedge resection: ?   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                               1. Hepatocellular carcinoma biopsy proven.   - Status post her second chemoembolization tolerated that very well.    - We reviewed her labs and tumor marker which are unremarkable. AFP is 2.1.  - Reviewed her CT AP which is negative, except for stable cirrhosis, no ascites.   - Had EGD on 05/23/2019 which showed esophageal varices, small and non-bleeding, so no intervention needed.    Anael is currently on surveillance.  We discussed the results of her scans today there is a 6 mm enhancement focus in segment 5/6.  We will discuss her case at our multidisciplinary liver tumor board we will plan on repeat  imaging in 3 months.  We will get a CT scan of the chest with contrast CT of the abdomen with and without contrast.  CBC CMP alpha-fetoprotein.  INR    Plan:  -Patient had CT chest abdomen and pelvis done today which were not read during the office visit  -On our review of the CT abdomen showed hyperenhancing, increase in the size of the hepatic nodule concerning for viable tumor.  We will discuss her case in the tumor board on 07/11/2021 and decide whether she is a candidate for microwave ablation of the lesion.   -Patient CT scan resulted after the office visit.  I called the patient and updated her daughter about the CT chest and abdomen results which showed increase in the size of the left upper lobe lesion from 6mm to 1 cm, also concern for viable tumor in the liver.  Also explained to them we will discuss his case in the tumor board and see if she needs a biopsy of the lung nodule and intervention/microwave ablation of the hepatic lesion.  - RTC in 7 weeks from now      2. Abnormal lung nodule of the left upper lobe suspicious for primary neoplasm of the lung .  Squamous cell carcinoma of the lung following with thoracic oncology Dr. 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  CT chest showed increase in size of a couple left lung nodules that most likely represent metastases. Couple sub-5 mm right lung nodules are unchanged.   Plan:  We will discuss her case in the [facility] tumor board, patient might need biopsy of the lung nodule to rule out primary lung cancer versus metastatic from [facility]        3. 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     4. Autoimmune hepatitis   - Following up with Dr. Ladona Ridgel well compensated.      Updated the above plan of care to the patient's daughter.  She verbalized understanding.      Filbert Schilder M.D.  Hematology/Oncology Fellow - PGY-5      Attestation by Dayton Bailiff, MD :  I interviewed and examined this patient with Dr. Sherrin Daisy and confirmed her history and physical findings.  I have personally reviewed the lab data and x-ray studies.  Together we formulated the assessment and plans outlined below

## 2021-07-08 ENCOUNTER — Encounter: Admit: 2021-07-08 | Discharge: 2021-07-08 | Payer: MEDICARE

## 2021-07-08 DIAGNOSIS — K759 Inflammatory liver disease, unspecified: Secondary | ICD-10-CM

## 2021-07-08 DIAGNOSIS — D899 Disorder involving the immune mechanism, unspecified: Secondary | ICD-10-CM

## 2021-07-08 DIAGNOSIS — C220 Liver cell carcinoma: Secondary | ICD-10-CM

## 2021-07-08 DIAGNOSIS — K7581 Nonalcoholic steatohepatitis (NASH): Secondary | ICD-10-CM

## 2021-07-08 DIAGNOSIS — K5790 Diverticulosis of intestine, part unspecified, without perforation or abscess without bleeding: Secondary | ICD-10-CM

## 2021-07-08 DIAGNOSIS — R911 Solitary pulmonary nodule: Secondary | ICD-10-CM

## 2021-07-08 DIAGNOSIS — J439 Emphysema, unspecified: Secondary | ICD-10-CM

## 2021-07-08 DIAGNOSIS — C499 Malignant neoplasm of connective and soft tissue, unspecified: Secondary | ICD-10-CM

## 2021-07-08 DIAGNOSIS — C4402 Squamous cell carcinoma of skin of lip: Secondary | ICD-10-CM

## 2021-07-08 DIAGNOSIS — K219 Gastro-esophageal reflux disease without esophagitis: Secondary | ICD-10-CM

## 2021-07-08 DIAGNOSIS — C3492 Malignant neoplasm of unspecified part of left bronchus or lung: Secondary | ICD-10-CM

## 2021-07-08 DIAGNOSIS — C801 Malignant (primary) neoplasm, unspecified: Secondary | ICD-10-CM

## 2021-07-08 NOTE — Progress Notes
Telehealth Visit    \  [facility]      PATIENT'S NAME: Sarah Pineda        DATE OF BIRTH / AGE: 09-16-1950 / 71 y.o.  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 PD-L1 5-10%.      CHIEF COMPLAINT: Regular follow up.      CURRENT TREATMENT: Active Surveillance      PAST TREATMENT:  - 07/16/17: LUL wedge resection  - 12/30/19 - 01/09/20: SBRT    ONCOLOGY HISTORY:  - Smoking hx: smoked 30 years, 0.5 pack per day on average. quitted for 3 years.  - 07/16/17: Left video assisted thoracoscopy left upper lobe wedge resection by Dr. Derenda Fennel. 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  - 10/22/19: CT?C/A/P: Development of 11 mm irregular pulmonary nodule in the left upper lobe suggesting recurrent pulmonary malignancy or metastasis.  - 10/29/19: PET: LUL nodule max SUV 9.5. 1. Post surgical changes of prior left upper lobe wedge resection with increased uptake about the resection margin, concerning for residual or   recurrent tumor. 2. New pulmonary nodule in the left upper lobe noted on CT of 10/22/2019 demonstrates significant increase FDG uptake, concerning for pulmonary metastasis.   - 11/12/19: PFT showed: FEV1 of 85% predicted and DLCO of 93% predicted   - 12/05/19: EBUS showed negative for mediastinal lymphadenopathy. However previous surgical site is positive for squamous carcinoma. PD-L1 5-10%.  - 01/08/21: s/p SBRT  - 01/26/20: Patient missed appointment with [place].  - 04/19/20: Telehealth to discuss adjuvant treatments.  - 04/26/20: Clinic visit with Med/Onc. Discussed adjuvant chemotherapy and immunotherapy in person. After weighing the benefits and risks, patient opted for NOT receiving these adjuvant treatments.  - 07/30/20: CT chest showed post-RT changes. It also shows Other subtle groundglass opacities in the right lung may reflect infection or drug reaction (in the appropriate clinical setting).   - 10/13/20: CT chest showed post-RT change. However Larger 4 mm left apical nodule which was a previously new punctate nodule on 04/07/2020. This is indeterminate though suspicious for metastasis. Short-term CT chest in 2-3 months is recommended for reevaluation as this is likely too small to biopsy or characterize on PET/CT. New punctate medial right lower lobe nodule which is also indeterminate and can be reevaluated on follow-up CT chest.  01/31/21: CT chest Continued increase in left apical nodule now measuring up to 6 mm, previously new in March 2022. Findings remain suspicious for metastasis given continued growth. Its small size again makes biopsy or PET/CT characterization difficult. Prior left upper lobe wedge resection. Similar bandlike consolidation in the left upper lobe near the suture line, obscuring previously treated left upper lobe nodule and compatible with prior radiation therapy. No enlarging lymph nodes.   - 03/23/21: CT chest Prior left upper lobe wedge resection. Stable adjacent post radiation fibrosis. Stable 6 mm left upper lobe pulmonary nodule which was new in March 2022. No thoracic lymphadenopathy. ABDOMEN: Previously treated segment 5/6 hepatocellular carcinoma. There is a 6 mm nodular area of hyperenhancement along the medial margin of the treatment bed. This is indeterminant. The largest treated, equivocal. Recommend follow-up in 3 months. Multiple additional small hyperenhancing foci are not significantly changed, collectively LR-3. Cirrhosis and portal hypertension  - 07/06/21: CT c/a/p Increase in  size of a couple left lung nodules that most likely represent metastases. Couple sub-5 mm right lung nodules are unchanged. Prior left upper lobe wedge resection with unchanged adjacent fibrosis. Healing fracture deformity of the lateral left 3rd rib. Previously treated hepatic segment 5-6 observation with increase in nodular enhancement along the anterior margin. LI-RADS treated nonviable. Multiple additional tiny subcentimeter arterial phase hyperenhancing observations are unchanged. LI-RADS 3. Loculated mesenteric gas adjacent to the cecum is benign and has been present on multiple prior imaging that has included this region, including imaging back to at least February 2021.        INTERVAL HISTORY: Doing well. She reports that her breathing has not changed.       PAST MEDICAL HISTORY:  Medical History:   Diagnosis Date   ? Cancer (HCC)     bone/ right leg   ? Diverticulosis    ? Emphysema lung (HCC)    ? GERD (gastroesophageal reflux disease)     Tums PRN   ? Hepatitis    ? Hepatocellular carcinoma (HCC)    ? Immune disorder (HCC)    ? Nonalcoholic steatohepatitis    ? Pulmonary nodule, left    ? 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 Squamous cell cancer of lip          PAST SURGICAL HISTORY:  Surgical History:   Procedure Laterality Date   ? AMPUTATION  1970    right leg-bone cancer   ? HX TUBAL LIGATION  1977   ? HX CHOLECYSTECTOMY  2004   ? ESOPHAGOGASTRODUODENOSCOPY WITH BIOPSY - FLEXIBLE N/A 05/18/2017    Performed by Virgina Organ, MD at [facility] ENDO   ? COLONOSCOPY DIAGNOSTIC WITH SPECIMEN COLLECTION BY BRUSHING/ WASHING - FLEXIBLE N/A 05/18/2017    Performed by Virgina Organ, MD at [facility] ENDO   ? LUNG SURGERY  07/24/2017    removal of lung wedge   ? left video assisted thoracoscopy with wedge resection Left 07/24/2017    Performed by Bryson Dames, MD at [facility] CVOR   ? ESOPHAGOGASTRODUODENOSCOPY WITH SPECIMEN COLLECTION BY BRUSHING/ WASHING N/A 05/23/2019    Performed by Dawna Part, MD at [facility] ENDO   ? BRONCHOSCOPY DIAGNOSTIC WITH/ WITHOUT CELL WASHING - FLEXIBLE N/A 12/05/2019    Performed by Boykin Nearing, Sherryl Manges, MD at [facility] OR   ? BRONCHOSCOPY WITH IMAGE - GUIDED NAVIGATION - FLEXIBLE N/A 12/05/2019    Performed by Boykin Nearing, Sherryl Manges, MD at [facility] OR   ? BRONCHOSCOPY WITH ENDOBRONCHIAL ULTRASOUND GUIDED TRANSTRACHEAL/ TRANSBRONCHIAL SAMPLING - 3 OR MORE MEDIASTINAL/ HILAR LYMPH NODE STATIONS/ STRUCTURE - FLEXIBLE N/A 12/05/2019    Performed by Boykin Nearing, Maykol Elvera Lennox, MD at [facility] OR   ? BRONCHOSCOPY WITH TRANSBRONCHIAL LUNG BIOPSY - FLEXIBLE - SINGLE LOBE N/A 12/05/2019    Performed by Boykin Nearing, Sherryl Manges, MD at [facility] OR   ? BRONCHOSCOPY WITH TRANSBRONCHIAL NEEDLE ASPIRATION AND BIOPSY TRACHEA/ MAIN STEM/ LOBAR BRONCHUS - FLEXIBLE N/A 12/05/2019    Performed by Boykin Nearing, Maykol Elvera Lennox, MD at [facility] OR   ? BRONCHOSCOPY WITH BRUSHING/ PROTECTED BRUSHING - FLEXIBLE N/A 12/05/2019    Performed by Boykin Nearing, Sherryl Manges, MD at [facility] OR   ? LIVER SURGERY  06/2017, 07/2017    x 2 Ablasion    ? LYMPH NODE BIOPSY      back   ? SKIN CANCER EXCISION      sqc- lip and nose  FAMILY HISTORY:  Family History   Problem Relation Age of Onset   ? Heart Failure Mother    ? Cancer Mother         Stomach   ? Heart Failure Father         bypass   ? Heart Disease Father    ? Stroke Brother         age 52   ? Cancer-Colon Brother    ? Aneurysm Brother    ? Diabetes Brother    ? Abnormal EKG Brother         Afib   ? Cancer Sister         Pancreatic   ? Heart Disease Maternal Grandfather    ? Melanoma Neg Hx           SOCIAL HISTORY:  Social History     Socioeconomic History   ? Marital status: Married   Tobacco Use   ? Smoking status: Former     Packs/day: 0.50     Years: 50.00     Pack years: 25.00     Types: Cigarettes     Quit date: 03/10/2017     Years since quitting: 4.3   ? Smokeless tobacco: Never   Vaping Use   ? Vaping Use: Never used   Substance and Sexual Activity   ? Alcohol use: Not Currently     Comment: couple times/year   ? Drug use: Never          ALLERGY:   Allergies   Allergen Reactions   ? Ciprofloxacin BLISTERS   ? Latex RASH and REDNESS     Pt states it tore up her skin MEDICATION:  Current Outpatient Medications on File Prior to Visit   Medication Sig Dispense Refill   ? aspirin EC 81 mg tablet Take one tablet by mouth daily after lunch. Take with food.     ? azaTHIOprine (IMURAN) 50 mg tablet Take one tablet by mouth at bedtime daily. 90 tablet 3   ? calcium citrate/vitamin D3 (CALCIUM CITRATE + D PO) Take 600 mg by mouth twice daily.     ? CONSTULOSE 10 gram/15 mL oral solution TAKE BY MOUTH TWO TO THREE TIMES DAILY (TITRATES TO 2-3 BOWEL MOVEMENTS DAILY) 1896 mL 3   ? furosemide (LASIX) 20 mg tablet Take one tablet by mouth every morning. 90 tablet 3   ? omeprazole DR (PRILOSEC) 20 mg capsule Take one capsule by mouth daily before breakfast. 90 capsule 3   ? ondansetron HCL (ZOFRAN) 8 mg tablet Take one tablet by mouth every 8 hours as needed (nausea and vomiting). 30 tablet 3   ? oxyCODONE-acetaminophen (PERCOCET) 7.5-325 mg tablet Take one tablet by mouth every 12 hours as needed for Pain.     ? spironolactone (ALDACTONE) 50 mg tablet Take one tablet by mouth daily with breakfast. Take with food. 90 tablet 3   ? XIFAXAN 550 mg tablet TAKE 1 TABLET BY MOUTH EVERY 12 HOURS 60 tablet 11     No current facility-administered medications on file prior to visit.          REVIEW OF SYSTEMS:    Constitutional: No weight loss. No fever or chills  Eyes: No red eyes. No itchy eyes. No double vision. No change on vision acuity    Ear, Nose, Mouth & Throat: No hearing loss. No discharge. No sore throat   Cardiovascular: No chest pain. No palpitations. No syncope  Respiratory: +  mild shortness of breath with exertion. + Occasional cough  Gastrointestinal: No nausea or vomiting. No difficulty swallowing. No abdominal pain. No diarrhea or constipation. No heartburn, throat pain or difficulty swallowing.  Genitourinary: No burning urination. No change in the color of urine  Musculoskeletal: No joints pain. No swelling of the upper or lower extremities    Integumentary: No rash. Neuro:  No headache. No focal weakness or numbness in the upper or lower extremities  Endocrine: No heat or cold intolerance. No excessive sweating  All other systems are negative.       PHYSICAL EXAMINATION:  There were no vitals filed for this visit.  Telehealth Visit    ECOG performance status: 1  ?  Constitutional: Alert and oriented x3.    Eyes: Pupils are equal bilaterally. No redness. No jaundice.   Ear, Nose, Mouth & Throat: Ear canals are patent. No discharge from the nose. Mucous membranes are moist  Cardiovascular: Regular rate and rhythm.    Respiratory: Good air entry bilaterally. No crackles or wheezes.  Gastrointestinal: Soft. Nontender. Nondistended. Good bowel sounds. No hepatosplenomegaly    Musculoskeletal & Extremities: No limitation in the joints' range of motion. No lower extremity edema, + right leg AKA  Neuro:  CN II-XII intact. Strength in the upper and lower extremities: intact. Sensations in the upper and lower extremities: intact    Integumentary: No rash.    Heme/Lymph/Immunology: No cervical, supraclavicular, axillary or inguinal lymphadenopathy      LABS: Reviewed.      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  - Patient is seen by thoracic surgeon Dr. Derenda Fennel and considered not an ideal surgical candidate.  - PET/CT showed increased uptake about the resection margin, which is concerning for residual or recurrent tumor. Case was discussed in the Tumor Board. An EBUS was performed which showed N0, but previous surgical site was positive for squamous carcinoma. Therefore this can be justified for T3 disease and making adjuvant chemotherapy reasonable after SBRT. However I did tell patient that this is an extrapolation of data from patients who got complete surgical resection, and the 5 year survival benefit is marginal. I will also have a discussion with [name] for the appropriateness of adjuvant chemotherapy due to her liver disease.  - Now s/p SBRT. We re-discussed possible adjuvant chemotherapy and immunotherapy. I presented current data but made it clear that they are all from surgical patients. In addition, patient is 3 months out of SBRT which makes the benefit less clear. 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 Patient expressed understanding. After discussing the benefits and risks of each treatment, patient decided NOT to go for adjuvant treatment. Started active surveillance.   - Recent scan showed an increase in two left lung nodules. We will obtain PET and refer to Dr. Regino Schultze for possible SBRT. This is already currently scheduled for early August. Patient would prefer to leave as is as she is scheduled to undergo a procedure for her liver.   - RTC in 2 months with Telehealth visit d/t distance and with CBC, CMP after restaging CT chest.      Above A/P were explained in detail to the patient and family who expressed understanding. All the relevant questions were addressed appropriately. Patient and  family understand in case of fever (>100.4), worsening chest pain or shortness of breath, or any other new symptoms, our on-call physician can be reached and 911 shall be called in case of emergencies.     ---  Deretha Emory, APRN    CC:  Bryson Dames, MD  Dayton Bailiff, MD  Jason Nest, MD

## 2021-07-11 ENCOUNTER — Encounter: Admit: 2021-07-11 | Discharge: 2021-07-11 | Payer: MEDICARE

## 2021-07-11 NOTE — Progress Notes
The patient case was discussed  at our multidisciplinary tumor board the most recent imaging was compared to previous imaging to pulmonary nodules 1 in the left apical area concerning for malignancy.  The nodule in the apical region can be biopsied to confirm which histology is the cause.  It is unlikely related to the patient's hepatocellular carcinoma and the lesion in the liver is very small could be easily microwave ablated and the tumor board recommendation was to consider biopsy of the apical lung nodule and microwave ablation of the liver lesion at the same time.

## 2021-07-13 ENCOUNTER — Encounter: Admit: 2021-07-13 | Discharge: 2021-07-13 | Payer: MEDICARE

## 2021-07-14 ENCOUNTER — Encounter: Admit: 2021-07-14 | Discharge: 2021-07-14 | Payer: MEDICARE

## 2021-07-14 DIAGNOSIS — C220 Liver cell carcinoma: Secondary | ICD-10-CM

## 2021-07-14 DIAGNOSIS — C3492 Malignant neoplasm of unspecified part of left bronchus or lung: Secondary | ICD-10-CM

## 2021-07-20 ENCOUNTER — Encounter: Admit: 2021-07-20 | Discharge: 2021-07-20 | Payer: MEDICARE

## 2021-07-20 DIAGNOSIS — C220 Liver cell carcinoma: Secondary | ICD-10-CM

## 2021-07-20 NOTE — Progress Notes
Received IR order from Dr. Arma Heading for liver mass biopsy and microwave ablation and biopsy of right upper lobe lung mass, on the same day. CT abdomen and CT chest done 6/16/ reviewed.  Labs done 07/06/21.    Discussed in [facility] conference 07/11/21.  Reviewed by Dr. Theda Sers who agrees with this plan and is agreeable to performing all 3 procedures on the same day.  Patient will need to be prone and in CT for lung mass biopsy.        Patient was previously treated in IR with MWA of hepatic segment 5/6 lesion 08/04/17 and MWA of hepatic segment 6 on 02/07/19.      Phone call made to patient to discuss scheduling of these procedures.  Spoke with patient's daughter, Jonelle Sidle. She requests not 7/10 through 7/14 due to previously scheduled trip. Patient scheduled for lung mass biopsy, liver mass biopsy and MWA of liver mass on 08/15/21 @ 8AM. Patient will need new labs and requests to have orders sent to [facility] in [place]. Pre Procedure instructions to be sent to My Chart.  PAT telehealth appointment pending.    Patient's daughter provided with name and number for this [place].

## 2021-07-29 ENCOUNTER — Encounter: Admit: 2021-07-29 | Discharge: 2021-07-29 | Payer: MEDICARE

## 2021-07-29 NOTE — Progress Notes
Pre procedure lab orders sent electronically to [facility] in [place] @ [phone].

## 2021-08-08 ENCOUNTER — Encounter: Admit: 2021-08-08 | Discharge: 2021-08-08 | Payer: MEDICARE

## 2021-08-08 ENCOUNTER — Ambulatory Visit: Admit: 2021-08-08 | Discharge: 2021-08-09 | Payer: MEDICARE

## 2021-08-08 DIAGNOSIS — R911 Solitary pulmonary nodule: Secondary | ICD-10-CM

## 2021-08-08 DIAGNOSIS — C499 Malignant neoplasm of connective and soft tissue, unspecified: Secondary | ICD-10-CM

## 2021-08-08 DIAGNOSIS — K7581 Nonalcoholic steatohepatitis (NASH): Secondary | ICD-10-CM

## 2021-08-08 DIAGNOSIS — J439 Emphysema, unspecified: Secondary | ICD-10-CM

## 2021-08-08 DIAGNOSIS — K759 Inflammatory liver disease, unspecified: Secondary | ICD-10-CM

## 2021-08-08 DIAGNOSIS — D899 Disorder involving the immune mechanism, unspecified: Secondary | ICD-10-CM

## 2021-08-08 DIAGNOSIS — C220 Liver cell carcinoma: Secondary | ICD-10-CM

## 2021-08-08 DIAGNOSIS — K219 Gastro-esophageal reflux disease without esophagitis: Secondary | ICD-10-CM

## 2021-08-08 DIAGNOSIS — C801 Malignant (primary) neoplasm, unspecified: Secondary | ICD-10-CM

## 2021-08-08 DIAGNOSIS — K5790 Diverticulosis of intestine, part unspecified, without perforation or abscess without bleeding: Secondary | ICD-10-CM

## 2021-08-08 DIAGNOSIS — C4402 Squamous cell carcinoma of skin of lip: Secondary | ICD-10-CM

## 2021-08-08 NOTE — Pre-Anesthesia Patient Instructions
PREPROCEDURE INFORMATION    Arrival at the hospital  [facility]  [address]  [place], [place] [address]    Park in the [employer], located directly across from the main entrance to the hospital.  Enter through the ground floor main hospital entrance and check in at the Information Desk in the lobby.  They will validate your parking ticket and direct you to the next location.    You will receive your day of surgery arrival time from your Surgeon's office.  If you have any questions, please contact your Surgeon's office for assistance.    Eating or drinking before surgery  Do not eat or drink anything after 11:00 p.m. the day before your procedure (including gum, mints, candy, or chewing tobacco) OR follow the specific instructions you were given by your Surgeon.  You may have WATER ONLY up to 2 hours before arriving at the hospital.    Planning transportation for outpatient procedure  For your safety, you will need to arrange for a responsible ride/person to accompany you home due to sedation or anesthesia with your procedure.  An [employer], taxi or other public transportation driver is not considered a responsible person to accompany you home.    Bath/Shower Instructions  Take a bath or shower with antibacterial soap the night before and the morning of your procedure. Use clean towels.  Put on clean clothes after bath or shower.  Avoid using lotion and oils.  If you are having surgery above the waist, wear a shirt that fastens up the front.  Sleep on clean sheets if bath or shower is done the night before procedure.    Morning of your procedure:  Brush your teeth and tongue  Do not smoke, vape, chew or user any tobacco products.  Do not shave the area where you will have surgery.  Remove nail polish, makeup and all jewelry (including piercings) before coming to the hospital.  Dress in clean, loose, comfortable clothing.    Valuables  Leave money, credit cards, jewelry, and any other valuables at home. The [facility] is not responsible for the loss or breakage of personal items.    What to bring to the hospital  ID/Insurance card  Medical Device card  Official documents for legal guardianship  Copy of your Living Will, Advanced Directives, and/or Durable Power of Attorney. If you have these documents, please bring them to the admissions office on the day of your surgery to be scanned into your records.  Do not bring medications from home unless instructed by a pharmacist.  CPAP/BiPAP machine (including all supplies)  Walker, cane, or motorized scooter  Cases for glasses/hearing aids/contact lens (bring solutions for contacts)     Notify us at [facility]: [phone] on the day of your procedure if:  You need to cancel your procedure.  You are going to be late.    Notify your surgeon if:  You become ill with a cough, fever, sore throat, nausea, vomiting or flu-like symptoms.  You have any open wounds/sores that are red, painful, draining, or are new since you last saw the doctor.  You need to cancel your procedure.    Preparing to get your medications at discharge  Your surgeon may prescribe you medications to take after your procedure.  If you like the convenience of having your medications filled here at [facility], please do the following:  Go to [facility] pharmacy after your [facility] appointment to put a  credit card on file.  Call [place] pharmacy at [phone] (Monday-Friday 7am-9pm or Saturday and Sunday 9am-5pm) to put a credit card on file.  Bring a credit card or cash on the day of your procedure- please leave with a family member rather than bringing it into the preop area.    Current Visitor Policy:  Visitors must be free of fever and symptoms to be in our facilities.  No more than 2 visitors per patient are allowed.  Additional guidelines may vary, based on patient care area or patient's condition.  Patients in semiprivate rooms may have visitors, but visits should be coordinated so only two total visitors are in a room at a time due to space limitations.  Children younger than age 51 are allowed to visit inpatients.    Thank you for participating in your Preoperative Assessment Clinic visit today.    If you have any changes to your health or hospitalizations between now and your surgery, please call us at [phone] for Main instructions.         Pre-Surgery Shower    Bathe with an antibacterial soap such as Dial Antibacterial (Dial Gold antibacterial bar soap - can be purchased at a pharmacy or [employer]).    Shower with this soap the DAY BEFORE and the MORNING OF your surgery.    This mild soap reduces the amount of germs on your skin that could cause an infection.    1) Shampoo your hair and wash your face with the soap you normally use.    2) From the neck down, apply the antibacterial bar soap with a clean washcloth.      3) Do not shave surgical area.    4) Turn water off to prevent rinsing the antibacterial soap off too soon.  Wash your body gently for 2 minutes.  Pay special attention to the area where you will have your surgery.  Do not wash with your regular soap after the antibacterial soap is used.    5) After this soap has been on your skin for 2 minutes, rinse your body thoroughly.    6) Pat yourself dry with a clean, soft towel.    7) Do not put products such as lotion, deodorant, powder, or perfume/aftershave on your hair, face or skin after your shower.    8) Put on freshly laundered clothes and sleep on freshly laundered linens.    9) Wear freshly laundered clothes to the hospital the day of surgery.

## 2021-08-08 NOTE — Anesthesia Pre-Procedure Evaluation
Anesthesia Pre-Procedure Evaluation    Name: Sarah Pineda      MRN: [id number]     DOB: July 04, 1950     Age: 71 y.o.     Sex: female   _________________________________________________________________________     Procedure Info:   Procedure Information     Date/Time: 08/15/21 0800    Procedures:       IR LIVER MASS BIOPSY      IR ABLATION      IR CT GUIDED LUNG BIOPSY    Location: Interventional Radiology: [facility]          Physical Assessment  Vital Signs (last filed in past 24 hours):  BP: 114/69 (07/17 1056)  Pulse: 55 (07/17 1056)  SpO2: 100 % (07/17 1056)  O2 Device: None (Room air) (07/17 1056)  Height: 167.6 cm (5' 6) (07/17 1056)  Weight: 78.9 kg (174 lb) (07/17 1056)      Patient History   Allergies   Allergen Reactions   ? Ciprofloxacin BLISTERS   ? Latex RASH and REDNESS     Pt states it tore up her skin        Current Medications    Medication Directions   aspirin EC 81 mg tablet Take one tablet by mouth daily after lunch. Take with food.   As of 08/08/21: ON HOLD due to all procedures/appointments. Last dose > 1 month ago. Pt to check in with Dr. Ladona Ridgel about resumption and when/if appropriate to start again.   azaTHIOprine (IMURAN) 50 mg tablet Take one tablet by mouth at bedtime daily.   calcium citrate/vitamin D3 (CALCIUM CITRATE + D PO) Take 1 tablet by mouth twice daily.   CONSTULOSE 10 gram/15 mL oral solution TAKE BY MOUTH TWO TO THREE TIMES DAILY (TITRATES TO 2-3 BOWEL MOVEMENTS DAILY)   furosemide (LASIX) 20 mg tablet Take one tablet by mouth every morning.   omeprazole DR (PRILOSEC) 20 mg capsule Take one capsule by mouth daily before breakfast.   ondansetron HCL (ZOFRAN) 8 mg tablet Take one tablet by mouth every 8 hours as needed (nausea and vomiting).   oxyCODONE-acetaminophen (PERCOCET) 7.5-325 mg tablet Take one tablet by mouth every 12 hours as needed for Pain.   spironolactone (ALDACTONE) 50 mg tablet Take one tablet by mouth daily with breakfast. Take with food. XIFAXAN 550 mg tablet TAKE 1 TABLET BY MOUTH EVERY 12 HOURS       Review of Systems/Medical History      Patient summary reviewed    PONV Screening: Non-smoker and Female sex  No history of anesthetic complications  No family history of anesthetic complications      Airway         12/05/19; Mask ventilation not attempted (0); Direct laryngoscopy, Stylet; Cuffed, Single-Lumen; ETT Size: 8.26mm; Mac; Blade Size: 3; Cricoid Pressure: No; Oral; 1-Full view of the glottis; 1 insertion attempt; Auscultation, ETCO2 Detector; Vol of Air in Cuff: 10 mL; Taped at Gums: 21 centimeters; atraumatic, dentition unchanged; 12/05/19; 1355      Pulmonary      Not a current smoker (quit 2019, 25PYH)      COPD ( no inhalers)      LUL sqNSCLC with sarcomatoid feature, 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      Cardiovascular         Exercise tolerance: >4 METS (uses crutches/wheelchair due to AKA; denies DOE/CP)      No hypertension,  No past MI:        No dysrhythmias      No indications/hx of CHF        No hyperlipidemia      GI/Hepatic/Renal             GERD (PPI), well controlled      Liver disease (autoimmune hepatitis, 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            Hepatitis (NASH)      Neuro/Psych         No CVA      No indications/hx of dementia      Musculoskeletal         Right AKA        Endocrine/Other             Autoimmune disease (autoimmune hepatitis)      Malignancy (HCC;  LUL sqNSCLC with sarcomatoid feature, 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, sarcoma - LLE):      Constitution - negative   PHYSICAL EXAM     Diagnostic Tests  Hematology:   Lab Results   Component Value Date    HGB 12.1 07/06/2021    HCT 34.4 07/06/2021    PLTCT 209 07/06/2021    WBC 6.4 07/06/2021    NEUT 87 07/06/2021    ANC 5.60 07/06/2021    LYMPH 25.7 06/04/2017    ALC 0.40 07/06/2021    MONA 4 07/06/2021    AMC 0.30 07/06/2021    EOSA 3 07/06/2021    ABC 0.00 07/06/2021    BASOPHILS 0.8 06/04/2017    MCV 91.0 07/06/2021    MCH 31.9 07/06/2021    MCHC 35.0 07/06/2021    MPV 9.0 07/06/2021    RDW 13.8 07/06/2021         General Chemistry:   Lab Results   Component Value Date    NA 132 07/06/2021    K 4.2 07/06/2021    CL 100 07/06/2021    CO2 24 07/06/2021    GAP 8 07/06/2021    BUN 17 07/06/2021    CR 0.91 07/06/2021    CR 1.0 07/06/2021    GLU 138 07/06/2021    GLU 93 08/20/2017    CA 9.4 07/06/2021    ALBUMIN 4.0 07/06/2021    MG 1.9 07/23/2017    TOTBILI 0.4 07/06/2021    PO4 4.2 07/23/2017      Coagulation:   Lab Results   Component Value Date    PT 11.9 07/06/2021    INR 1.1 07/06/2021     CT C/A/P 07/06/21 (CE):  IMPRESSION     CHEST:   1. Increase in size of a couple left lung nodules that most likely   represent metastases. Couple sub-5 mm right lung nodules are unchanged.   2. Prior left upper lobe wedge resection with unchanged adjacent fibrosis.   3. Healing fracture deformity of the lateral left 3rd rib ?     ABDOMEN:   1. Previously treated hepatic segment 5-6 observation with increase in   nodular enhancement along the anterior margin. LI-RADS treated nonviable.   2. Multiple additional tiny subcentimeter arterial phase hyperenhancing   observations are unchanged. LI-RADS 3   3. Loculated mesenteric gas adjacent to the cecum is benign and has been   present on multiple prior imaging that has included this region, including   imaging back to at least February 2021.     EKG 12/05/19  PAC Plan    Interview: Telehealth Interview    ASA Score: 3    Anesthesia Options Discussed: General                          PAC RISK ASSESSMENTS:   *Duke Activity Status Index (DASI): 10.7, Calculated METS: 4.06 (score < 25 correlates with increased risk for death, MI, and moderate to severe complications, max score 57)  *STOP-BANG Score: 1 (total 5-8 high risk for OSA, 3-4 with HCO3 >28 also high risk. OSA associated with greater than twice the odds for respiratory failure, cardiac events, ICU admission, and difficult intubation)      T&S reviewed from 07/22/2017      PLAN

## 2021-08-08 NOTE — Telephone Encounter
Sarah Pineda LVM pt has a procedure on 7/24. She was asking if Dr. Lovena Le has any pre and/or post-op recommendations for amaran. Please call back.

## 2021-08-08 NOTE — Telephone Encounter
NC tried to call Sarah Pineda back but the extension recorded is not a working extension. There will be no changes to her Imuran at this time. She can take her medication as prescribed.

## 2021-08-12 ENCOUNTER — Encounter: Admit: 2021-08-12 | Discharge: 2021-08-12 | Payer: MEDICARE

## 2021-08-15 ENCOUNTER — Encounter: Admit: 2021-08-15 | Discharge: 2021-08-15 | Payer: MEDICARE

## 2021-08-15 ENCOUNTER — Ambulatory Visit: Admit: 2021-08-15 | Discharge: 2021-08-15 | Payer: MEDICARE

## 2021-08-15 DIAGNOSIS — K7581 Nonalcoholic steatohepatitis (NASH): Secondary | ICD-10-CM

## 2021-08-15 DIAGNOSIS — C3492 Malignant neoplasm of unspecified part of left bronchus or lung: Secondary | ICD-10-CM

## 2021-08-15 DIAGNOSIS — K219 Gastro-esophageal reflux disease without esophagitis: Secondary | ICD-10-CM

## 2021-08-15 DIAGNOSIS — C220 Liver cell carcinoma: Secondary | ICD-10-CM

## 2021-08-15 DIAGNOSIS — C499 Malignant neoplasm of connective and soft tissue, unspecified: Secondary | ICD-10-CM

## 2021-08-15 DIAGNOSIS — R911 Solitary pulmonary nodule: Secondary | ICD-10-CM

## 2021-08-15 DIAGNOSIS — K759 Inflammatory liver disease, unspecified: Secondary | ICD-10-CM

## 2021-08-15 DIAGNOSIS — J439 Emphysema, unspecified: Secondary | ICD-10-CM

## 2021-08-15 DIAGNOSIS — D899 Disorder involving the immune mechanism, unspecified: Secondary | ICD-10-CM

## 2021-08-15 DIAGNOSIS — K5790 Diverticulosis of intestine, part unspecified, without perforation or abscess without bleeding: Secondary | ICD-10-CM

## 2021-08-15 DIAGNOSIS — C4402 Squamous cell carcinoma of skin of lip: Secondary | ICD-10-CM

## 2021-08-15 DIAGNOSIS — C801 Malignant (primary) neoplasm, unspecified: Secondary | ICD-10-CM

## 2021-08-15 MED ORDER — HYDROMORPHONE (PF) 2 MG/ML IJ SYRG
INTRAVENOUS | 0 refills | Status: DC
Start: 2021-08-15 — End: 2021-08-15

## 2021-08-15 MED ORDER — FENTANYL CITRATE (PF) 50 MCG/ML IJ SOLN
25 ug | INTRAVENOUS | 0 refills | Status: DC | PRN
Start: 2021-08-15 — End: 2021-08-15

## 2021-08-15 MED ORDER — ARTIFICIAL TEARS (PF) SINGLE DOSE DROPS GROUP
OPHTHALMIC | 0 refills | Status: DC
Start: 2021-08-15 — End: 2021-08-15
  Administered 2021-08-15: 14:00:00 2 [drp] via OPHTHALMIC

## 2021-08-15 MED ORDER — SODIUM CHLORIDE 0.9 % IV SOLP
INTRAVENOUS | 0 refills | Status: DC
Start: 2021-08-15 — End: 2021-08-15
  Administered 2021-08-15: 13:00:00 via INTRAVENOUS

## 2021-08-15 MED ORDER — AMPICILLIN/SULBACTAM 3G/100ML NS IVPB (MB+)
3 g | Freq: Once | INTRAVENOUS | 0 refills | Status: CP
Start: 2021-08-15 — End: ?
  Administered 2021-08-15: 14:00:00 3 g via INTRAVENOUS

## 2021-08-15 MED ORDER — PROPOFOL INJ 10 MG/ML IV VIAL
INTRAVENOUS | 0 refills | Status: DC
Start: 2021-08-15 — End: 2021-08-15

## 2021-08-15 MED ORDER — FENTANYL CITRATE (PF) 50 MCG/ML IJ SOLN
50 ug | INTRAVENOUS | 0 refills | Status: DC | PRN
Start: 2021-08-15 — End: 2021-08-15

## 2021-08-15 MED ORDER — OXYCODONE 5 MG PO TAB
5-10 mg | Freq: Once | ORAL | 0 refills | Status: CP | PRN
Start: 2021-08-15 — End: ?

## 2021-08-15 MED ORDER — HYDROMORPHONE (PF) 2 MG/ML IJ SYRG
.5-1 mg | INTRAVENOUS | 0 refills | Status: DC | PRN
Start: 2021-08-15 — End: 2021-08-15

## 2021-08-15 MED ORDER — OXYCODONE 5 MG PO TAB
5 mg | ORAL_TABLET | ORAL | 0 refills | 6.00000 days | Status: AC | PRN
Start: 2021-08-15 — End: ?
  Filled 2021-08-15: qty 20, 5d supply, fill #1

## 2021-08-15 MED ORDER — LIDOCAINE (PF) 200 MG/10 ML (2 %) IJ SYRG
INTRAVENOUS | 0 refills | Status: DC
Start: 2021-08-15 — End: 2021-08-15

## 2021-08-15 MED ORDER — HALOPERIDOL LACTATE 5 MG/ML IJ SOLN
1 mg | Freq: Once | INTRAVENOUS | 0 refills | Status: DC | PRN
Start: 2021-08-15 — End: 2021-08-15

## 2021-08-15 MED ORDER — MEPERIDINE (PF) 25 MG/ML IJ SYRG
12.5 mg | INTRAVENOUS | 0 refills | Status: DC | PRN
Start: 2021-08-15 — End: 2021-08-15

## 2021-08-15 MED ORDER — SUGAMMADEX 100 MG/ML IV SOLN
INTRAVENOUS | 0 refills | Status: DC
Start: 2021-08-15 — End: 2021-08-15

## 2021-08-15 MED ORDER — DEXAMETHASONE SODIUM PHOSPHATE 4 MG/ML IJ SOLN
INTRAVENOUS | 0 refills | Status: DC
Start: 2021-08-15 — End: 2021-08-15

## 2021-08-15 MED ORDER — FENTANYL CITRATE (PF) 50 MCG/ML IJ SOLN
INTRAVENOUS | 0 refills | Status: DC
Start: 2021-08-15 — End: 2021-08-15

## 2021-08-15 MED ORDER — ONDANSETRON HCL 4 MG PO TAB
4 mg | ORAL_TABLET | ORAL | 0 refills | 8.00000 days | Status: AC | PRN
Start: 2021-08-15 — End: ?
  Filled 2021-08-15: qty 30, 8d supply, fill #1

## 2021-08-15 MED ORDER — ROCURONIUM 10 MG/ML IV SOLN
INTRAVENOUS | 0 refills | Status: DC
Start: 2021-08-15 — End: 2021-08-15

## 2021-08-15 MED ORDER — ONDANSETRON HCL (PF) 4 MG/2 ML IJ SOLN
INTRAVENOUS | 0 refills | Status: DC
Start: 2021-08-15 — End: 2021-08-15

## 2021-08-15 NOTE — Progress Notes
Initial post procedure chest xray read by Dr. Raelene Bott. No pneumothorax.

## 2021-08-15 NOTE — Progress Notes
Pt recovery complete. 1 hour post procedure chest xray read by Dr. Kerrin Champagne. No pneumothorax.     Ambulatory status: Ambulatory with Assistance.   Vitals stable. Dressing is clean, dry, and intact.  Pain level returned to baseline.  Discharge instructions reviewed with: Patient, spouse, and daughter  AVS signed and provided to patient.    Pt discharged to main lobby via wheelchair transport.

## 2021-08-15 NOTE — H&P (View-Only)
Pre Procedure History and Physical/Sedation Plan-OP    Procedure Date: 08/15/2021     Planned Procedure(s):  CT guided Liver biopsy and ablation, LUL lung biopsy    Indication for exam: Diagnostic biopsies, therapeutic treatment of liver mass   __________________________________________________________________    Chief Complaint:  SCC of the lung, HCC    History of Present Illness: Sarah Pineda is a 71 y.o. female.    Patient Active Problem List    Diagnosis Date Noted   ? Age-related osteoporosis without current pathological fracture 11/29/2020   ? History of tobacco abuse 11/10/2019   ? Chronic obstructive pulmonary disease, unspecified (HCC) 11/10/2019   ? 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 Non-small cell lung cancer (HCC) 08/03/2017   ? Pneumothorax of left lung after biopsy 07/16/2017   ? Amputation of right lower extremity (HCC) 07/16/2017   ? Lung nodules 06/19/2017   ? Hepatocellular carcinoma (HCC) 05/27/2017   ? Immunosuppression (HCC) 05/27/2017   ? Autoimmune hepatitis (HCC) 03/18/2017   ? End stage liver disease (HCC) 03/14/2017   ? Elevated liver enzymes 03/13/2017   ? Elevated INR 03/13/2017   ? Hyponatremia 03/13/2017   ? Liver cirrhosis secondary to NASH (HCC) 03/13/2017   ? History of squamous cell carcinoma 11/28/2010   ? Skin lesion 11/28/2010     Medical History:   Diagnosis Date   ? Cancer (HCC)     bone/ right leg   ? Diverticulosis    ? Emphysema lung (HCC)    ? GERD (gastroesophageal reflux disease)     Tums PRN   ? Hepatitis    ? Hepatocellular carcinoma (HCC)    ? Immune disorder (HCC)    ? Nonalcoholic steatohepatitis    ? Pulmonary nodule, left    ? 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 Squamous cell cancer of lip       Surgical History:   Procedure Laterality Date   ? AMPUTATION  1970    right leg-bone cancer   ? HX TUBAL LIGATION  1977   ? HX CHOLECYSTECTOMY  2004   ? ESOPHAGOGASTRODUODENOSCOPY WITH BIOPSY - FLEXIBLE N/A 05/18/2017 Performed by Virgina Organ, MD at [facility] ENDO   ? COLONOSCOPY DIAGNOSTIC WITH SPECIMEN COLLECTION BY BRUSHING/ WASHING - FLEXIBLE N/A 05/18/2017    Performed by Virgina Organ, MD at [facility] ENDO   ? LUNG SURGERY  07/24/2017    removal of lung wedge   ? left video assisted thoracoscopy with wedge resection Left 07/24/2017    Performed by Bryson Dames, MD at [facility] CVOR   ? ESOPHAGOGASTRODUODENOSCOPY WITH SPECIMEN COLLECTION BY BRUSHING/ WASHING N/A 05/23/2019    Performed by Dawna Part, MD at [facility] ENDO   ? BRONCHOSCOPY DIAGNOSTIC WITH/ WITHOUT CELL WASHING - FLEXIBLE N/A 12/05/2019    Performed by Boykin Nearing, Sherryl Manges, MD at [facility] OR   ? BRONCHOSCOPY WITH IMAGE - GUIDED NAVIGATION - FLEXIBLE N/A 12/05/2019    Performed by Boykin Nearing, Sherryl Manges, MD at [facility] OR   ? BRONCHOSCOPY WITH ENDOBRONCHIAL ULTRASOUND GUIDED TRANSTRACHEAL/ TRANSBRONCHIAL SAMPLING - 3 OR MORE MEDIASTINAL/ HILAR LYMPH NODE STATIONS/ STRUCTURE - FLEXIBLE N/A 12/05/2019    Performed by Boykin Nearing, Maykol Elvera Lennox, MD at [facility] OR   ? BRONCHOSCOPY WITH TRANSBRONCHIAL LUNG BIOPSY - FLEXIBLE - SINGLE LOBE N/A 12/05/2019    Performed by Boykin Nearing, Sherryl Manges, MD at [facility] OR   ? BRONCHOSCOPY WITH TRANSBRONCHIAL  NEEDLE ASPIRATION AND BIOPSY TRACHEA/ MAIN STEM/ LOBAR BRONCHUS - FLEXIBLE N/A 12/05/2019    Performed by Boykin Nearing, Maykol Elvera Lennox, MD at [facility] OR   ? BRONCHOSCOPY WITH BRUSHING/ PROTECTED BRUSHING - FLEXIBLE N/A 12/05/2019    Performed by Boykin Nearing, Sherryl Manges, MD at [facility] OR   ? LIVER SURGERY      MWA 08/04/17, 02/07/19   ? LYMPH NODE BIOPSY      back   ? SKIN CANCER EXCISION      sqc- lip and nose      Medications Prior to Admission   Medication Sig Dispense Refill Last Dose   ? aspirin EC 81 mg tablet Take one tablet by mouth daily after lunch. Take with food.   As of 08/08/21: ON HOLD due to all procedures/appointments. Last dose > 1 month ago. Pt to check in with Dr. Ladona Ridgel about resumption and when/if appropriate to start again.   >1 Month   ? azaTHIOprine (IMURAN) 50 mg tablet Take one tablet by mouth at bedtime daily. 90 tablet 3 08/14/2021   ? calcium citrate/vitamin D3 (CALCIUM CITRATE + D PO) Take 1 tablet by mouth twice daily.   08/14/2021   ? CONSTULOSE 10 gram/15 mL oral solution TAKE BY MOUTH TWO TO THREE TIMES DAILY (TITRATES TO 2-3 BOWEL MOVEMENTS DAILY) 1896 mL 3 08/14/2021   ? furosemide (LASIX) 20 mg tablet Take one tablet by mouth every morning. 90 tablet 3 08/14/2021   ? omeprazole DR (PRILOSEC) 20 mg capsule Take one capsule by mouth daily before breakfast. 90 capsule 3 08/15/2021   ? ondansetron HCL (ZOFRAN) 8 mg tablet Take one tablet by mouth every 8 hours as needed (nausea and vomiting). 30 tablet 3    ? oxyCODONE-acetaminophen (PERCOCET) 7.5-325 mg tablet Take one tablet by mouth every 12 hours as needed for Pain.   Past Month   ? spironolactone (ALDACTONE) 50 mg tablet Take one tablet by mouth daily with breakfast. Take with food. 90 tablet 3 08/14/2021   ? XIFAXAN 550 mg tablet TAKE 1 TABLET BY MOUTH EVERY 12 HOURS 60 tablet 11 08/14/2021     Allergies   Allergen Reactions   ? Ciprofloxacin BLISTERS   ? Latex RASH and REDNESS     Pt states it tore up her skin       Social History:   Social History     Tobacco Use   ? Smoking status: Former     Packs/day: 0.50     Years: 50.00     Pack years: 25.00     Types: Cigarettes     Quit date: 03/10/2017     Years since quitting: 4.4   ? Smokeless tobacco: Never   Vaping Use   ? Vaping Use: Never used   Substance Use Topics   ? Alcohol use: Not Currently     Comment: couple times/year      Family History   Problem Relation Age of Onset   ? Heart Failure Mother    ? Cancer Mother         Stomach   ? Heart Failure Father         bypass   ? Heart Disease Father    ? Stroke Brother         age 42   ? Cancer-Colon Brother    ? Aneurysm Brother    ? Diabetes Brother    ? Abnormal EKG Brother  Afib   ? Cancer Sister         Pancreatic   ? Heart Disease Maternal Grandfather    ? Melanoma Neg Hx         Review of Systems  All other systems reviewed and are negative.    Previous Anesthetic/Sedation History:  Reviewed    Code Status: Full Code    Physical Exam:  Vital Signs: Last Filed In 24 Hours Vital Signs: 24 Hour Range   BP: 106/88 (07/24 0715)  Temp: 36.6 ?C (97.8 ?F) (07/24 0715)  Pulse: 82 (07/24 0715)  Respirations: 18 PER MINUTE (07/24 0715)  SpO2: 94 % (07/24 0715)  O2 Device: None (Room air) (07/24 0715)  SpO2 Pulse: 82 (07/24 0715) BP: (106-114)/(74-88)   Temp:  [36.6 ?C (97.8 ?F)]   Pulse:  [81-82]   Respirations:  [18 PER MINUTE]   SpO2:  [94 %]   O2 Device: None (Room air)            General appearance: alert and no distress  Neurologic: Grossly normal, at baseline  Lungs: Nonlabored with normal effort  Abdomen: soft, non-tender.   Extremities: extremities normal,       Pre-procedure anxiolysis plan: Per Anesthesia  Sedation/Medication Plan: Per Anesthesia  Personal history of sedation complications: Per Anesthesia  Family history of sedation complications: Per Anesthesia  Medications for Reversal: Per Anesthesia  Discussion/Reviews:  Physician has discussed risks and alternatives of this type of sedation and above planned procedures with patient    NPO Status: Acceptable  Airway:  Per Anesthesia  Head and Neck: Per Anesthesia  Mouth: Per Anesthesia   Anesthesia Classification:  Per Anesthesia  Pregnancy Status: N/A       Lab/Radiology/Other Diagnostic Tests:  Labs:    Hematology:    Lab Results   Component Value Date    HGB 12.1 07/06/2021    HCT 34.4 07/06/2021    PLTCT 209 07/06/2021    WBC 6.4 07/06/2021    NEUT 87 07/06/2021    ANC 5.60 07/06/2021    LYMPH 25.7 06/04/2017    ALC 0.40 07/06/2021    MONA 4 07/06/2021    AMC 0.30 07/06/2021    ABC 0.00 07/06/2021    BASOPHILS 0.8 06/04/2017    MCV 91.0 07/06/2021    MCHC 35.0 07/06/2021    MPV 9.0 07/06/2021    RDW 13.8 07/06/2021   , Coagulation:    Lab Results   Component Value Date    PT 11.9 07/06/2021    INR 1.1 07/06/2021    and General Chemistry:    Lab Results   Component Value Date    NA 132 07/06/2021    K 4.2 07/06/2021    CL 100 07/06/2021    GAP 8 07/06/2021    BUN 17 07/06/2021    CR 0.91 07/06/2021    CR 1.0 07/06/2021    GLU 138 07/06/2021    GLU 93 08/20/2017    CA 9.4 07/06/2021    ALBUMIN 4.0 07/06/2021    MG 1.9 07/23/2017    TOTBILI 0.4 07/06/2021              Philmore Pali, APRN-NP  Pager [phone]

## 2021-08-15 NOTE — Other
Immediate Post Procedure Note    Date:  08/15/2021                                         Attending Physician:   Arlie Solomons, MD  Performing Provider:  Tona Sensing, MD    Consent:  Consent obtained from patient.  Time out performed: Consent obtained, correct patient verified, correct procedure verified, correct site verified, patient marked as necessary.  Pre/Post Procedure Diagnosis:  Hepatocellular carcinoma. Suspected pulmonary metastasis.  Indications:  See above.      Procedure(s):  CT guided biopsy and microwave ablation of segment 5/6 liver mass and biopsy of  Left upper lobe nodule.  Findings:  Successful biopsy of left upper lobe pulmonary nodule and successful biopsy and microwave ablation of the segment 5/6 hepatic mass.     Estimated Blood Loss:  None/Negligible  Specimen(s) Removed/Disposition:  Yes, sent to pathology  Complications: None  Patient Tolerated Procedure: Well  Post-Procedure Condition:  stable    Tona Sensing, MD  Pager

## 2021-08-15 NOTE — Progress Notes
Anesthesia staff present to monitor patient airway, vital signs, and medications. See anesthesia docflow. This RN will assist as needed.

## 2021-08-16 ENCOUNTER — Encounter: Admit: 2021-08-16 | Discharge: 2021-08-16 | Payer: MEDICARE

## 2021-08-16 NOTE — Telephone Encounter
Follow up phone call made to patient after lung mass biopsy and liver mass biopsy and MWA yesterday in IR.  Patient states she is doing well except an aching pain in her right shoulder. Informed patient this is common after a liver mass biopsy and MWA and is a referred pain from the liver in most instances.  Advised her to take tylenol as she is able and to use heating pad and/or ice pack. She said she took one of the prescribed pain pill and is feeling fine.      She denies fever, chills, bleeding, chest pain, shortness of breath, nausea or vomiting. She plans to remove the procedure dressings later this evening.  She is currently scheduled for follow up labs, imaging and clinic appointment 08/24/21 wit Dr. Arma Heading. This appointment will be rescheduled and moved out to one month post procedure.    Patient had no further questions or concerns at this time.

## 2021-08-16 NOTE — Telephone Encounter
-----   Message from Fritzi Mandes, RN sent at 08/15/2021 12:33 PM CDT -----  Regarding: need to push out 8/2 appts  Hello,    This patient just got her liver directed treatment today so we need to push out her 8/2 labs,scans, alrajabi to 4 weeks from now. Please let the patient know of the times, thank you!    Luther Redo

## 2021-08-19 ENCOUNTER — Encounter: Admit: 2021-08-19 | Discharge: 2021-08-19 | Payer: MEDICARE

## 2021-08-24 ENCOUNTER — Encounter: Admit: 2021-08-24 | Discharge: 2021-08-24 | Payer: MEDICARE

## 2021-08-26 ENCOUNTER — Encounter: Admit: 2021-08-26 | Discharge: 2021-08-26 | Payer: MEDICARE

## 2021-08-29 NOTE — Progress Notes
[facility]      PATIENT'S NAME: Sarah Pineda        DATE OF BIRTH / AGE: 1950-02-22 / 71 y.o.  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 PD-L1 5-10%.      CHIEF COMPLAINT: Regular follow up.      CURRENT TREATMENT: Active Surveillance      PAST TREATMENT:  - 07/16/17: LUL wedge resection  - 12/30/19 - 01/09/20: SBRT    ONCOLOGY HISTORY:  - Smoking hx: smoked 30 years, 0.5 pack per day on average. quitted for 3 years.  - 07/16/17: Left video assisted thoracoscopy left upper lobe wedge resection by Dr. Derenda Fennel. 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  - 10/22/19: CT?C/A/P: Development of 11 mm irregular pulmonary nodule in the left upper lobe suggesting recurrent pulmonary malignancy or metastasis.  - 10/29/19: PET: LUL nodule max SUV 9.5. 1. Post surgical changes of prior left upper lobe wedge resection with increased uptake about the resection margin, concerning for residual or   recurrent tumor. 2. New pulmonary nodule in the left upper lobe noted on CT of 10/22/2019 demonstrates significant increase FDG uptake, concerning for pulmonary metastasis.   - 11/12/19: PFT showed: FEV1 of 85% predicted and DLCO of 93% predicted   - 12/05/19: EBUS showed negative for mediastinal lymphadenopathy. However previous surgical site is positive for squamous carcinoma. PD-L1 5-10%.  - 01/08/21: s/p SBRT  - 01/26/20: Patient missed appointment with [place].  - 04/19/20: Telehealth to discuss adjuvant treatments.  - 04/26/20: Clinic visit with Med/Onc. Discussed adjuvant chemotherapy and immunotherapy in person. After weighing the benefits and risks, patient opted for NOT receiving these adjuvant treatments.  - 07/30/20: CT chest showed post-RT changes. It also shows Other subtle groundglass opacities in the right lung may reflect infection or drug reaction (in the appropriate clinical setting).   - 10/13/20: CT chest showed post-RT change. However Larger 4 mm left apical nodule which was a previously new punctate nodule on 04/07/2020. This is indeterminate though suspicious for metastasis. Short-term CT chest in 2-3 months is recommended for reevaluation as this is likely too small to biopsy or characterize on PET/CT. New punctate medial right lower lobe nodule which is also indeterminate and can be reevaluated on follow-up CT chest.  01/31/21: CT chest Continued increase in left apical nodule now measuring up to 6 mm, previously new in March 2022. Findings remain suspicious for metastasis given continued growth. Its small size again makes biopsy or PET/CT characterization difficult. Prior left upper lobe wedge resection. Similar bandlike consolidation in the left upper lobe near the suture line, obscuring previously treated left upper lobe nodule and compatible with prior radiation therapy. No enlarging lymph nodes.   - 03/23/21: CT chest Prior left upper lobe wedge resection. Stable adjacent post radiation fibrosis. Stable 6 mm left upper lobe pulmonary nodule which was new in March 2022. No thoracic lymphadenopathy. ABDOMEN: Previously treated segment 5/6 hepatocellular carcinoma. There is a 6 mm nodular area of hyperenhancement along the medial margin of the treatment bed. This is indeterminant. The largest treated, equivocal. Recommend follow-up in 3 months. Multiple additional small hyperenhancing foci are not significantly changed, collectively LR-3. Cirrhosis and portal hypertension  - 07/06/21: CT c/a/p Increase in size of a couple left lung nodules  that most likely represent metastases. Couple sub-5 mm right lung nodules are unchanged. Prior left upper lobe wedge resection with unchanged adjacent fibrosis. Healing fracture deformity of the lateral left 3rd rib. Previously treated hepatic segment 5-6 observation with increase in nodular enhancement along the anterior margin. LI-RADS treated nonviable. Multiple additional tiny subcentimeter arterial phase hyperenhancing observations are unchanged. LI-RADS 3. Loculated mesenteric gas adjacent to the cecum is benign and has been present on multiple prior imaging that has included this region, including imaging back to at least February 2021.   - 08/15/21: Alveolar lung parenchyma, left lung mass biopsy: Squamous cell carcinoma.        INTERVAL HISTORY: She has been doing well since her biopsies and recent liver ablation. Breathing is stable. Denies any fevers, chills, or recent illnesses.       PAST MEDICAL HISTORY:  Medical History:   Diagnosis Date   ? Cancer (HCC)     bone/ right leg   ? Diverticulosis    ? Emphysema lung (HCC)    ? GERD (gastroesophageal reflux disease)     Tums PRN   ? Hepatitis    ? Hepatocellular carcinoma (HCC)    ? Immune disorder (HCC)    ? Nonalcoholic steatohepatitis    ? Pulmonary nodule, left    ? 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 Squamous cell cancer of lip          PAST SURGICAL HISTORY:  Surgical History:   Procedure Laterality Date   ? AMPUTATION  1970    right leg-bone cancer   ? HX TUBAL LIGATION  1977   ? HX CHOLECYSTECTOMY  2004   ? ESOPHAGOGASTRODUODENOSCOPY WITH BIOPSY - FLEXIBLE N/A 05/18/2017    Performed by Virgina Organ, MD at [facility] ENDO   ? COLONOSCOPY DIAGNOSTIC WITH SPECIMEN COLLECTION BY BRUSHING/ WASHING - FLEXIBLE N/A 05/18/2017    Performed by Virgina Organ, MD at [facility] ENDO   ? LUNG SURGERY  07/24/2017    removal of lung wedge   ? left video assisted thoracoscopy with wedge resection Left 07/24/2017    Performed by Bryson Dames, MD at [facility] CVOR   ? ESOPHAGOGASTRODUODENOSCOPY WITH SPECIMEN COLLECTION BY BRUSHING/ WASHING N/A 05/23/2019    Performed by Dawna Part, MD at [facility] ENDO   ? BRONCHOSCOPY DIAGNOSTIC WITH/ WITHOUT CELL WASHING - FLEXIBLE N/A 12/05/2019    Performed by Boykin Nearing, Sherryl Manges, MD at [facility] OR   ? BRONCHOSCOPY WITH IMAGE - GUIDED NAVIGATION - FLEXIBLE N/A 12/05/2019    Performed by Boykin Nearing, Sherryl Manges, MD at [facility] OR   ? BRONCHOSCOPY WITH ENDOBRONCHIAL ULTRASOUND GUIDED TRANSTRACHEAL/ TRANSBRONCHIAL SAMPLING - 3 OR MORE MEDIASTINAL/ HILAR LYMPH NODE STATIONS/ STRUCTURE - FLEXIBLE N/A 12/05/2019    Performed by Boykin Nearing, Maykol Elvera Lennox, MD at [facility] OR   ? BRONCHOSCOPY WITH TRANSBRONCHIAL LUNG BIOPSY - FLEXIBLE - SINGLE LOBE N/A 12/05/2019    Performed by Boykin Nearing, Sherryl Manges, MD at [facility] OR   ? BRONCHOSCOPY WITH TRANSBRONCHIAL NEEDLE ASPIRATION AND BIOPSY TRACHEA/ MAIN STEM/ LOBAR BRONCHUS - FLEXIBLE N/A 12/05/2019    Performed by Boykin Nearing, Maykol Elvera Lennox, MD at [facility] OR   ? BRONCHOSCOPY WITH BRUSHING/ PROTECTED BRUSHING - FLEXIBLE N/A 12/05/2019    Performed by Boykin Nearing, Sherryl Manges, MD at [facility] OR   ? LIVER SURGERY      MWA 08/04/17, 02/07/19   ? LYMPH NODE BIOPSY      back   ?  SKIN CANCER EXCISION      sqc- lip and nose         FAMILY HISTORY:  Family History   Problem Relation Age of Onset   ? Heart Failure Mother    ? Cancer Mother         Stomach   ? Heart Failure Father         bypass   ? Heart Disease Father    ? Stroke Brother         age 17   ? Cancer-Colon Brother    ? Aneurysm Brother    ? Diabetes Brother    ? Abnormal EKG Brother         Afib   ? Cancer Sister         Pancreatic   ? Heart Disease Maternal Grandfather    ? Melanoma Neg Hx           SOCIAL HISTORY:  Social History     Socioeconomic History   ? Marital status: Married   Tobacco Use   ? Smoking status: Former     Packs/day: 0.50     Years: 50.00     Pack years: 25.00     Types: Cigarettes     Quit date: 03/10/2017     Years since quitting: 4.4   ? Smokeless tobacco: Never   Vaping Use   ? Vaping Use: Never used   Substance and Sexual Activity   ? Alcohol use: Not Currently     Comment: couple times/year   ? Drug use: Never          ALLERGY:   Allergies Allergen Reactions   ? Ciprofloxacin BLISTERS   ? Latex RASH and REDNESS     Pt states it tore up her skin          MEDICATION:  Current Outpatient Medications on File Prior to Visit   Medication Sig Dispense Refill   ? aspirin EC 81 mg tablet Take one tablet by mouth daily after lunch. Take with food.   As of 08/08/21: ON HOLD due to all procedures/appointments. Last dose > 1 month ago. Pt to check in with Dr. Ladona Ridgel about resumption and when/if appropriate to start again.     ? azaTHIOprine (IMURAN) 50 mg tablet Take one tablet by mouth at bedtime daily. 90 tablet 3   ? calcium citrate/vitamin D3 (CALCIUM CITRATE + D PO) Take 1 tablet by mouth twice daily.     ? CONSTULOSE 10 gram/15 mL oral solution TAKE BY MOUTH TWO TO THREE TIMES DAILY (TITRATES TO 2-3 BOWEL MOVEMENTS DAILY) 1896 mL 3   ? furosemide (LASIX) 20 mg tablet Take one tablet by mouth every morning. 90 tablet 3   ? omeprazole DR (PRILOSEC) 20 mg capsule Take one capsule by mouth daily before breakfast. 90 capsule 3   ? ondansetron HCL (ZOFRAN) 4 mg tablet Take one tablet by mouth every 6 hours as needed for Nausea or Vomiting. 30 tablet 0   ? ondansetron HCL (ZOFRAN) 8 mg tablet Take one tablet by mouth every 8 hours as needed (nausea and vomiting). 30 tablet 3   ? oxyCODONE (ROXICODONE) 5 mg tablet Take one tablet by mouth every 6 hours as needed for Pain. 20 tablet 0   ? oxyCODONE-acetaminophen (PERCOCET) 7.5-325 mg tablet Take one tablet by mouth every 12 hours as needed for Pain.     ? spironolactone (ALDACTONE) 50 mg tablet Take one tablet by mouth  daily with breakfast. Take with food. 90 tablet 3   ? XIFAXAN 550 mg tablet TAKE 1 TABLET BY MOUTH EVERY 12 HOURS 60 tablet 11     No current facility-administered medications on file prior to visit.          REVIEW OF SYSTEMS:    Constitutional: No weight loss. No fever or chills  Eyes: No red eyes. No itchy eyes. No double vision. No change on vision acuity    Ear, Nose, Mouth & Throat: No hearing loss. No discharge. No sore throat   Cardiovascular: No chest pain. No palpitations. No syncope  Respiratory: + mild shortness of breath with exertion, stable. + Occasional cough  Gastrointestinal: No nausea or vomiting. No difficulty swallowing. No abdominal pain. No diarrhea or constipation. No heartburn, throat pain or difficulty swallowing.  Genitourinary: No burning urination. No change in the color of urine  Musculoskeletal: No joints pain. No swelling of the upper or lower extremities    Integumentary: No rash.    Neuro:  No headache. No focal weakness or numbness in the upper or lower extremities  Endocrine: No heat or cold intolerance. No excessive sweating  All other systems are negative.       PHYSICAL EXAMINATION:  Vitals:    08/30/21 0951   BP: 113/65   Pulse: 82   Temp: 36.9 ?C (98.4 ?F)   Resp: 16   SpO2: 97%     Telehealth Visit    ECOG performance status: 1  ?  Constitutional: Alert and oriented x3.    Eyes: Pupils are equal bilaterally. No redness. No jaundice.   Ear, Nose, Mouth & Throat: Ear canals are patent. No discharge from the nose. Mucous membranes are moist  Cardiovascular: Regular rate and rhythm.    Respiratory: Good air entry bilaterally. No crackles or wheezes.  Gastrointestinal: Soft. Nontender. Nondistended. Good bowel sounds. No hepatosplenomegaly    Musculoskeletal & Extremities: No limitation in the joints' range of motion. No lower extremity edema, + right leg AKA  Neuro:  CN II-XII intact. Strength in the upper and lower extremities: intact. Sensations in the upper and lower extremities: intact    Integumentary: No rash.    Heme/Lymph/Immunology: No cervical, supraclavicular, axillary or inguinal lymphadenopathy      LABS: Reviewed.      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  - Patient is seen by thoracic surgeon Dr. Derenda Fennel and considered not an ideal surgical candidate.  - PET/CT showed increased uptake about the resection margin, which is concerning for residual or recurrent tumor. Case was discussed in the Tumor Board. An EBUS was performed which showed N0, but previous surgical site was positive for squamous carcinoma. Therefore this can be justified for T3 disease and making adjuvant chemotherapy reasonable after SBRT. However I did tell patient that this is an extrapolation of data from patients who got complete surgical resection, and the 5 year survival benefit is marginal. I will also have a discussion with [name] for the appropriateness of adjuvant chemotherapy due to her liver disease.  - Now s/p SBRT. We re-discussed possible adjuvant chemotherapy and immunotherapy. I presented current data but made it clear that they are all from surgical patients. In addition, patient is 3 months out of SBRT which makes the benefit  less clear. 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 Patient expressed understanding. After discussing the benefits and risks of each treatment, patient decided NOT to go for adjuvant treatment. Started active surveillance.   - Discussed with Dr. Chipper Herb, PET pending for today and follow up with Dr. Regino Schultze for possible SBRT.   - RTC in 2 months with Telehealth visit d/t distance and with CBC, CMP after restaging CT chest.      Above A/P were explained in detail to the patient and family who expressed understanding. All the relevant questions were addressed appropriately. Patient and family understand in case of fever (>100.4), worsening chest pain or shortness of breath, or any other new symptoms, our on-call physician can be reached and 911 shall be called in case of emergencies.     ---  Deretha Emory, APRN    CC:  Bryson Dames, MD  Dayton Bailiff, MD  Jason Nest, MD

## 2021-08-30 ENCOUNTER — Encounter: Admit: 2021-08-30 | Discharge: 2021-08-30 | Payer: MEDICARE

## 2021-08-30 ENCOUNTER — Ambulatory Visit: Admit: 2021-08-30 | Discharge: 2021-08-30 | Payer: MEDICARE

## 2021-08-30 DIAGNOSIS — K7581 Nonalcoholic steatohepatitis (NASH): Secondary | ICD-10-CM

## 2021-08-30 DIAGNOSIS — C220 Liver cell carcinoma: Secondary | ICD-10-CM

## 2021-08-30 DIAGNOSIS — C4402 Squamous cell carcinoma of skin of lip: Secondary | ICD-10-CM

## 2021-08-30 DIAGNOSIS — C801 Malignant (primary) neoplasm, unspecified: Secondary | ICD-10-CM

## 2021-08-30 DIAGNOSIS — R911 Solitary pulmonary nodule: Secondary | ICD-10-CM

## 2021-08-30 DIAGNOSIS — K5790 Diverticulosis of intestine, part unspecified, without perforation or abscess without bleeding: Secondary | ICD-10-CM

## 2021-08-30 DIAGNOSIS — K759 Inflammatory liver disease, unspecified: Secondary | ICD-10-CM

## 2021-08-30 DIAGNOSIS — C3492 Malignant neoplasm of unspecified part of left bronchus or lung: Secondary | ICD-10-CM

## 2021-08-30 DIAGNOSIS — C499 Malignant neoplasm of connective and soft tissue, unspecified: Secondary | ICD-10-CM

## 2021-08-30 DIAGNOSIS — D899 Disorder involving the immune mechanism, unspecified: Secondary | ICD-10-CM

## 2021-08-30 DIAGNOSIS — K219 Gastro-esophageal reflux disease without esophagitis: Secondary | ICD-10-CM

## 2021-08-30 DIAGNOSIS — J439 Emphysema, unspecified: Secondary | ICD-10-CM

## 2021-08-30 MED ORDER — RP DX F-18 FDG MCI
10 | Freq: Once | INTRAVENOUS | 0 refills | Status: CP
Start: 2021-08-30 — End: ?

## 2021-09-06 ENCOUNTER — Encounter: Admit: 2021-09-06 | Discharge: 2021-09-06 | Payer: MEDICARE

## 2021-09-06 NOTE — Progress Notes
Plan: Offer support/resources.    Intervention: SW reviewed chart and sent MyChart message to pt to introduce self and offer support/resources.    SW offered to assist with local lodging. SW included links for [employer] and [employer].    SW included phone number and encouraged pt to contact SW if questions/needs.    SW will remain available to assist as needed.    Lorriane Shire, LMSW  Social Work Case Manager  [phone]; Voalte Me

## 2021-09-13 ENCOUNTER — Ambulatory Visit: Admit: 2021-09-13 | Discharge: 2021-09-13 | Payer: MEDICARE

## 2021-09-13 ENCOUNTER — Encounter: Admit: 2021-09-13 | Discharge: 2021-09-13 | Payer: MEDICARE

## 2021-09-16 ENCOUNTER — Encounter: Admit: 2021-09-16 | Discharge: 2021-09-16 | Payer: MEDICARE

## 2021-09-16 ENCOUNTER — Ambulatory Visit: Admit: 2021-09-16 | Discharge: 2021-09-16 | Payer: MEDICARE

## 2021-09-16 DIAGNOSIS — C220 Liver cell carcinoma: Secondary | ICD-10-CM

## 2021-09-16 DIAGNOSIS — C499 Malignant neoplasm of connective and soft tissue, unspecified: Secondary | ICD-10-CM

## 2021-09-16 DIAGNOSIS — R911 Solitary pulmonary nodule: Secondary | ICD-10-CM

## 2021-09-16 DIAGNOSIS — C3492 Malignant neoplasm of unspecified part of left bronchus or lung: Secondary | ICD-10-CM

## 2021-09-16 DIAGNOSIS — D899 Disorder involving the immune mechanism, unspecified: Secondary | ICD-10-CM

## 2021-09-16 DIAGNOSIS — J439 Emphysema, unspecified: Secondary | ICD-10-CM

## 2021-09-16 DIAGNOSIS — K759 Inflammatory liver disease, unspecified: Secondary | ICD-10-CM

## 2021-09-16 DIAGNOSIS — C4402 Squamous cell carcinoma of skin of lip: Secondary | ICD-10-CM

## 2021-09-16 DIAGNOSIS — K5790 Diverticulosis of intestine, part unspecified, without perforation or abscess without bleeding: Secondary | ICD-10-CM

## 2021-09-16 DIAGNOSIS — K7581 Nonalcoholic steatohepatitis (NASH): Secondary | ICD-10-CM

## 2021-09-16 DIAGNOSIS — C3412 Malignant neoplasm of upper lobe, left bronchus or lung: Secondary | ICD-10-CM

## 2021-09-16 DIAGNOSIS — K219 Gastro-esophageal reflux disease without esophagitis: Secondary | ICD-10-CM

## 2021-09-16 DIAGNOSIS — C801 Malignant (primary) neoplasm, unspecified: Secondary | ICD-10-CM

## 2021-09-16 LAB — CBC AND DIFF
ABSOLUTE BASO COUNT: 0 K/UL (ref 0–0.20)
ABSOLUTE EOS COUNT: 0.1 K/UL (ref 0–0.45)
MCH: 31 pg (ref 26–34)
MCHC: 34 g/dL (ref 32.0–36.0)
MCV: 91 FL (ref 80–100)
RDW: 13 % (ref 11–15)
WBC COUNT: 4.9 K/UL — ABNORMAL LOW (ref 4.5–11.0)

## 2021-09-16 LAB — PROTIME INR (PT)
INR: 1.1 g/dL — ABNORMAL LOW (ref 0.8–1.2)
PROTIME: 12 s — ABNORMAL LOW (ref 9.5–14.2)

## 2021-09-16 LAB — COMPREHENSIVE METABOLIC PANEL
POTASSIUM: 4.3 MMOL/L (ref 3.5–5.1)
SODIUM: 130 MMOL/L — ABNORMAL LOW (ref 137–147)

## 2021-09-16 LAB — ALPHA FETO PROTEIN (AFP), NON-PREGNANT: AFP: 2.7 ng/mL — ABNORMAL LOW (ref 0.0–15.0)

## 2021-09-16 MED ORDER — SODIUM CHLORIDE 0.9 % IJ SOLN
50 mL | Freq: Once | INTRAVENOUS | 0 refills | Status: CP
Start: 2021-09-16 — End: ?

## 2021-09-16 MED ORDER — IOHEXOL 350 MG IODINE/ML IV SOLN
100 mL | Freq: Once | INTRAVENOUS | 0 refills | Status: CP
Start: 2021-09-16 — End: ?

## 2021-09-16 NOTE — Progress Notes
Name: Sarah Pineda          MRN: [id number]      DOB: 1950-07-03      AGE: 71 y.o.   DATE OF SERVICE: 09/16/2021    Subjective:             Reason for Visit:  Cancer (Pt states she is not a limb restriction.)      Sarah Pineda is a 71 y.o. female.      Cancer Staging   Hepatocellular carcinoma (HCC)  Staging form: Liver, AJCC 8th Edition  - Clinical: cT1, cN0, cM0 - Signed by Dayton Bailiff, MD on 06/06/2017    Non-small cell lung cancer (HCC)  Staging form: Lung, AJCC 8th Edition  - Pathologic: Stage IA2 (pT1b, pN0, cM0) - Signed by Dayton Bailiff, MD on 08/03/2017  - Pathologic: No stage assigned - Unsigned        History of Present Illness     [name] having a lot of right upper quadrant discomfort and shoulder pain after her microwave ablation this is gotten better.  She denies any shortness of breath or cough.  Has seen radiation oncology and the plan is to start with SBRT early September.  Denies any ascites or encephalopathy she is here to review her scans.    .     Review of Systems   Constitutional: Negative for fatigue, fever and unexpected weight change.   HENT: Negative for facial swelling, mouth sores, sore throat, tinnitus, trouble swallowing and voice change.    Respiratory: Negative for cough, choking, chest tightness, shortness of breath and wheezing.    Cardiovascular: Negative for chest pain, palpitations and leg swelling.   Gastrointestinal: Negative for abdominal distention, abdominal pain, anal bleeding, blood in stool, constipation, diarrhea, nausea, rectal pain and vomiting.   Genitourinary: Negative for difficulty urinating, dysuria, frequency and hematuria.   Musculoskeletal: Negative for arthralgias, back pain, gait problem, joint swelling, myalgias and neck pain.   Skin: Negative for pallor, rash and wound.   Neurological: Negative for dizziness, seizures, syncope, speech difficulty, weakness, light-headedness, numbness and headaches.   Hematological: Negative for adenopathy. Does not bruise/bleed easily.   Psychiatric/Behavioral: Negative for dysphoric mood and sleep disturbance. The patient is not nervous/anxious.          Objective:         ? aspirin EC 81 mg tablet Take one tablet by mouth daily after lunch. Take with food.   As of 08/08/21: ON HOLD due to all procedures/appointments. Last dose > 1 month ago. Pt to check in with Dr. Ladona Ridgel about resumption and when/if appropriate to start again.   ? azaTHIOprine (IMURAN) 50 mg tablet Take one tablet by mouth at bedtime daily.   ? calcium citrate/vitamin D3 (CALCIUM CITRATE + D PO) Take 1 tablet by mouth twice daily.   ? CONSTULOSE 10 gram/15 mL oral solution TAKE BY MOUTH TWO TO THREE TIMES DAILY (TITRATES TO 2-3 BOWEL MOVEMENTS DAILY)   ? furosemide (LASIX) 20 mg tablet Take one tablet by mouth every morning.   ? omeprazole DR (PRILOSEC) 20 mg capsule Take one capsule by mouth daily before breakfast.   ? ondansetron HCL (ZOFRAN) 4 mg tablet Take one tablet by mouth every 6 hours as needed for Nausea or Vomiting.   ? ondansetron HCL (ZOFRAN) 8 mg tablet Take one tablet by mouth every 8 hours as needed (nausea and vomiting).   ? oxyCODONE (ROXICODONE) 5 mg tablet Take one tablet by  mouth every 6 hours as needed for Pain.   ? oxyCODONE-acetaminophen (PERCOCET) 7.5-325 mg tablet Take one tablet by mouth every 12 hours as needed for Pain.   ? spironolactone (ALDACTONE) 50 mg tablet Take one tablet by mouth daily with breakfast. Take with food.   ? XIFAXAN 550 mg tablet TAKE 1 TABLET BY MOUTH EVERY 12 HOURS     Vitals:    09/16/21 1242   BP: (!) 116/99   BP Source: Arm, Left Upper   Pulse: 89  Comment: pulse is irregular, dropped to 40   Temp: 36.1 ?C (97 ?F)   Resp: 16   SpO2: 98%   TempSrc: Temporal   PainSc: Zero   Weight: 79.8 kg (176 lb)     Body mass index is 28.41 kg/m?Marland Kitchen     Pain Score: Zero       Fatigue Scale: 0-None    Pain Addressed:  N/A    Patient Evaluated for a Clinical Trial: No treatment clinical trial available for this patient.     [place] Cooperative Oncology Group performance status is 1, Restricted in physically strenuous activity but ambulatory and able to carry out work of a light or sedentary nature, e.g., light house work, office work.     Physical Exam  Constitutional:       General: She is not in acute distress.     Appearance: Normal appearance. She is well-developed.   HENT:      Head: Normocephalic and atraumatic.   Eyes:      General: No scleral icterus.        Right eye: No discharge.      Pupils: Pupils are equal, round, and reactive to light.   Neck:      Thyroid: No thyromegaly.   Cardiovascular:      Rate and Rhythm: Normal rate and regular rhythm.      Heart sounds: Normal heart sounds. No murmur heard.     No friction rub.   Pulmonary:      Effort: No respiratory distress.      Breath sounds: Normal breath sounds. No wheezing or rales.   Abdominal:      General: Bowel sounds are normal. There is no distension.      Palpations: Abdomen is soft. There is no hepatomegaly, splenomegaly or mass.      Tenderness: There is no abdominal tenderness. There is no guarding or rebound.      Hernia: No hernia is present.   Musculoskeletal:         General: No tenderness.      Cervical back: Full passive range of motion without pain, normal range of motion and neck supple.      Comments: Right lower limb amputation   Lymphadenopathy:      Head:      Right side of head: No submental, submandibular, tonsillar, preauricular, posterior auricular or occipital adenopathy.      Left side of head: No submental, submandibular, tonsillar, preauricular, posterior auricular or occipital adenopathy.      Cervical: No cervical adenopathy.      Upper Body:      Right upper body: No supraclavicular adenopathy.      Left upper body: No supraclavicular adenopathy.   Skin:     General: Skin is warm.      Findings: No petechiae or rash. Rash is not purpuric.   Neurological:      Mental Status: She is alert and oriented to person, place, and  time.      Cranial Nerves: No cranial nerve deficit.      Sensory: No sensory deficit.      Coordination: Coordination normal.      Deep Tendon Reflexes: Reflexes are normal and symmetric.   Psychiatric:         Mood and Affect: Mood is not anxious or depressed.         Behavior: Behavior normal.         Thought Content: Thought content normal.         Judgment: Judgment normal.                Assessment and Plan:  Problem List        Oncology    Hepatocellular carcinoma [facility])    Overview       ?  She was admitted to the hospital in 02/2017 with decompensated liver failure. Her MELD at that time was 27. She is currently still on prednisone taper with plans to start azathioprine soon  ?  ?  ?  03/14/17 - AFP = 22.4 and 05/09/17 = 8.3  ?  03/15/17 - MRI ABD impression Redemonstration of 1.7 cm LI RADS 3 observation in segment 5/6 of the liver with arterial enhancement. No appreciable washout or additional significant signal abnormality.  Heterogeneous perfusion pattern throughout the liver without additional discrete mass. Follow-up CT or MRI abdomen (liver protocol) in 3 months recommended. Cirrhosis with small portosystemic varices. No splenomegaly or ascites. ?  ?  03/15/17 Liver biopsy  SURG PATH #: [id number]  A.  Chronic hepatitis with moderate to severe interface and lobular activity with lymphocytic and lympho- plasmacytic infiltrates in the portal tracts and extending into the lobule and areas of confluent hepatocyte necrosis/collapse, compatible with ? ? autoimmune hepatitis. ?   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   See comment.   ?  03/29/17 - Liver biopsy  SURG PATH #: [id number]  A. Liver, mass, biopsy: ? Negative for mass/lesion.  ?  4//8/19 Liver biopsy SURG PATH #: [id number] A. Liver mass, biopsy: Well differentiated hepatocellular carcinoma. ? ?   ?  05/30/17 mri pelvis IMPRESSION ?Three soft tissue nodules within the right pelvis that are unchanged since February 2019. ?These most likely represent metastases from the reported sarcoma. ?Consider biopsy of the largest nodule, as clinically indicated.      ?05/31/17 US Guided bx of the target right gluteal soft tissue nodule  ?Successful ultrasound guided biopsy of the target right gluteal soft tissue mass. ?Four core biopsy specimens submitted to pathology in formalin  ?  05/31/17 PATHOLOGY SURG PATH #: [id number] Diagnosis:  A. Nerve bundles, pelvic lymph node biopsy, core needle biopsy: ? Abundant nerve bundles. See comment.        She is to also have MWA along with TACE for LI-Rads 5 lesion -   08/22/17 Successful CT -guided Microwave Ablation of hepatic segment 5/6 lesion  02/07/19 Successful CT -guided Microwave Ablation of segment 6 liver tumor.               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 Lung, left upper lobe wedge mass, wedge resection: ?   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  1. Hepatocellular carcinoma biopsy proven.   - Status post her second chemoembolization tolerated that very well.    - We reviewed her labs and tumor marker which are unremarkable. AFP is 2.1.  - Reviewed her CT AP which is negative, except for stable cirrhosis, no ascites.   - Had EGD on 05/23/2019 which showed esophageal varices, small and non-bleeding, so no intervention needed.    [facility]ifi is currently on surveillance.  We discussed results of her scans today showing no evidence of viable tumor I recommended follow-up in 3 months with CT scan of the chest CT of the abdomen with and without contrast CBC CMP alpha-fetoprotein    Plan:  -Follow-up in 3 months with CT scan of the chest with contrast CT of the abdomen with and without contrast CBC CMP alpha-fetoprotein INR.      2. Abnormal lung nodule of the left upper lobe suspicious for primary neoplasm of the lung .  Squamous cell carcinoma of the lung following with thoracic oncology Dr. 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  CT chest showed increase in size of a couple left lung nodules that most likely represent metastases. Couple sub-5 mm right lung nodules are unchanged.   Plan:  We will proceed with SBRT we will follow-up with her response 3 months.        3. 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     4. Autoimmune hepatitis   - Following up with Dr. Ladona Ridgel well compensated.      Updated the above plan of care to the patient's daughter.  She verbalized understanding.

## 2021-09-23 ENCOUNTER — Ambulatory Visit: Admit: 2021-09-23 | Discharge: 2021-09-23 | Payer: MEDICARE

## 2021-09-27 ENCOUNTER — Encounter: Admit: 2021-09-27 | Discharge: 2021-09-27 | Payer: MEDICARE

## 2021-09-27 MED ORDER — CONSTULOSE 10 GRAM/15 ML PO SOLN
ORAL | 3 refills | 21.00000 days | Status: AC
Start: 2021-09-27 — End: ?

## 2021-09-28 ENCOUNTER — Ambulatory Visit: Admit: 2021-09-28 | Discharge: 2021-09-28 | Payer: MEDICARE

## 2021-09-28 ENCOUNTER — Encounter: Admit: 2021-09-28 | Discharge: 2021-09-28 | Payer: MEDICARE

## 2021-09-30 ENCOUNTER — Ambulatory Visit: Admit: 2021-09-30 | Discharge: 2021-09-30 | Payer: MEDICARE

## 2021-09-30 ENCOUNTER — Encounter: Admit: 2021-09-30 | Discharge: 2021-09-30 | Payer: MEDICARE

## 2021-10-04 ENCOUNTER — Encounter: Admit: 2021-10-04 | Discharge: 2021-10-04 | Payer: MEDICARE

## 2021-10-04 ENCOUNTER — Ambulatory Visit: Admit: 2021-10-04 | Discharge: 2021-10-04 | Payer: MEDICARE

## 2021-10-06 ENCOUNTER — Ambulatory Visit: Admit: 2021-10-06 | Discharge: 2021-10-06 | Payer: MEDICARE

## 2021-10-06 ENCOUNTER — Encounter: Admit: 2021-10-06 | Discharge: 2021-10-06 | Payer: MEDICARE

## 2021-10-10 ENCOUNTER — Encounter: Admit: 2021-10-10 | Discharge: 2021-10-10 | Payer: MEDICARE

## 2021-10-10 ENCOUNTER — Ambulatory Visit: Admit: 2021-10-10 | Discharge: 2021-10-10 | Payer: MEDICARE

## 2021-10-10 DIAGNOSIS — K759 Inflammatory liver disease, unspecified: Secondary | ICD-10-CM

## 2021-10-10 DIAGNOSIS — K7581 Nonalcoholic steatohepatitis (NASH): Secondary | ICD-10-CM

## 2021-10-10 DIAGNOSIS — J439 Emphysema, unspecified: Secondary | ICD-10-CM

## 2021-10-10 DIAGNOSIS — K5790 Diverticulosis of intestine, part unspecified, without perforation or abscess without bleeding: Secondary | ICD-10-CM

## 2021-10-10 DIAGNOSIS — C499 Malignant neoplasm of connective and soft tissue, unspecified: Secondary | ICD-10-CM

## 2021-10-10 DIAGNOSIS — C4402 Squamous cell carcinoma of skin of lip: Secondary | ICD-10-CM

## 2021-10-10 DIAGNOSIS — K219 Gastro-esophageal reflux disease without esophagitis: Secondary | ICD-10-CM

## 2021-10-10 DIAGNOSIS — C3412 Malignant neoplasm of upper lobe, left bronchus or lung: Secondary | ICD-10-CM

## 2021-10-10 DIAGNOSIS — C801 Malignant (primary) neoplasm, unspecified: Secondary | ICD-10-CM

## 2021-10-10 DIAGNOSIS — C220 Liver cell carcinoma: Secondary | ICD-10-CM

## 2021-10-10 DIAGNOSIS — R911 Solitary pulmonary nodule: Secondary | ICD-10-CM

## 2021-10-10 DIAGNOSIS — D899 Disorder involving the immune mechanism, unspecified: Secondary | ICD-10-CM

## 2021-10-10 NOTE — Progress Notes
End of Treatment Summary Note    Date:  10/10/2021    Sarah Pineda is a 71 y.o. female.     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  Staging:  Cancer Staging   Hepatocellular carcinoma ([place])  Staging form: Liver, AJCC 8th Edition  - Clinical: cT1, cN0, cM0 - Signed by Lenise Arena, MD on 06/06/2017    Non-small cell lung cancer ([place])  Staging form: Lung, AJCC 8th Edition  - Pathologic: Stage IA2 (pT1b, pN0, cM0) - Signed by Lenise Arena, MD on 08/03/2017  - Pathologic: No stage assigned - Unsigned      Treatment Data Summary:   SBRT: Stereotactic Body Radiation Therapy       01/07/2020     3:46 PM 01/09/2020     2:16 PM 09/28/2021     3:52 PM 09/30/2021     2:38 PM 10/04/2021     2:56 PM 10/06/2021     2:29 PM 10/10/2021     2:20 PM   Treatment Data   Course ID C1 SBRT LT Lung  C1 SBRT LT Lung  C2 Lt Lung 23  C2 Lt Lung 23  C2 Lt Lung 23  C2 Lt Lung 23  C2 Lt Lung 23    Plan ID SBRT Lt Lung  SBRT Lt Lung  LUL  LUL  LUL  LUL  LUL    Prescription Dose (cGy) 5,000  5,000  5,000  5,000  5,000  5,000  5,000    Prescribed Dose per Fraction (Gy) 10  10  10  10  10  10  10     Fractions Treated to Date 4  5  1  2  3  4  5     Total Fractions on Plan 5  5  5  5  5  5  5     Treatment Elapsed Days 8  10  0  2  6  8  12     Reference Point ID SBRT Lt Lung  SBRT Lt Lung  Lt Lung 23  Lt Lung 23  Lt Lung 23  Lt Lung 23  Lt Lung 23    Dosage Given to Date 40  50  10  20  30   40  50         Site of treatment: left lung  Date of treatment: 9/6/-18/2023  Dose /fraction: 5000 cGy, 5 fractions.  Technique of radiation: SBRT, photon      Treatment Tolerance and Response:  The patient tolerated treatment well without unexpected or significant acute side effects.        Follow Up:  Sarah Pineda will be scheduled for a follow up US on 12/15/2021.

## 2021-10-10 NOTE — Progress Notes
Weekly Management Progress Note    Date: 10/10/2021    Sarah Pineda is a 71 y.o. female.     Vitals:  Vitals:    10/10/21 1434   BP: 126/73   BP Source: Arm, Left Upper   Pulse: 81   SpO2: 95%   PainSc: Zero   Weight: 78.5 kg (173 lb)  Comment: per pt     Subjective   There were no encounter diagnoses.  Staging:  Cancer Staging   Hepatocellular carcinoma ([place])  Staging form: Liver, AJCC 8th Edition  - Clinical: cT1, cN0, cM0 - Signed by Lenise Arena, MD on 06/06/2017    Non-small cell lung cancer ([place])  Staging form: Lung, AJCC 8th Edition  - Pathologic: Stage IA2 (pT1b, pN0, cM0) - Signed by Lenise Arena, MD on 08/03/2017  - Pathologic: No stage assigned - Unsigned      Body mass index is 27.92 kg/m.  Pain Score: Zero     Fatigue Scale: 0-None    Treatment Data Summary:          01/07/2020     3:46 PM 01/09/2020     2:16 PM 09/28/2021     3:52 PM 09/30/2021     2:38 PM 10/04/2021     2:56 PM 10/06/2021     2:29 PM 10/10/2021     2:20 PM   Treatment Data   Course ID C1 SBRT LT Lung  C1 SBRT LT Lung  C2 Lt Lung 23  C2 Lt Lung 23  C2 Lt Lung 23  C2 Lt Lung 23  C2 Lt Lung 23    Plan ID SBRT Lt Lung  SBRT Lt Lung  LUL  LUL  LUL  LUL  LUL    Prescription Dose (cGy) 5,000  5,000  5,000  5,000  5,000  5,000  5,000    Prescribed Dose per Fraction (Gy) 10  10  10  10  10  10  10     Fractions Treated to Date 4  5  1  2  3  4  5     Total Fractions on Plan 5  5  5  5  5  5  5     Treatment Elapsed Days 8  10  0  2  6  8  12     Reference Point ID SBRT Lt Lung  SBRT Lt Lung  Lt Lung 23  Lt Lung 23  Lt Lung 23  Lt Lung 23  Lt Lung 23    Dosage Given to Date (Gy) 40  50  10  20  30   40  50         Subjective:  Doing well     Objective:WNL     Assessment: lung cancer    Plan: EOT today.

## 2021-10-21 ENCOUNTER — Encounter: Admit: 2021-10-21 | Discharge: 2021-10-21 | Payer: MEDICARE

## 2021-10-21 DIAGNOSIS — K754 Autoimmune hepatitis: Secondary | ICD-10-CM

## 2021-10-21 DIAGNOSIS — K7210 Chronic hepatic failure without coma: Secondary | ICD-10-CM

## 2021-10-21 MED ORDER — AZATHIOPRINE 50 MG PO TAB
50 mg | ORAL_TABLET | Freq: Every evening | ORAL | 3 refills | Status: AC
Start: 2021-10-21 — End: ?

## 2021-10-26 ENCOUNTER — Encounter: Admit: 2021-10-26 | Discharge: 2021-10-26 | Payer: MEDICARE

## 2021-10-31 ENCOUNTER — Encounter: Admit: 2021-10-31 | Discharge: 2021-10-31 | Payer: MEDICARE

## 2021-11-04 ENCOUNTER — Encounter: Admit: 2021-11-04 | Discharge: 2021-11-04 | Payer: MEDICARE

## 2021-11-04 DIAGNOSIS — K754 Autoimmune hepatitis: Secondary | ICD-10-CM

## 2021-11-04 DIAGNOSIS — K7210 Chronic hepatic failure without coma: Secondary | ICD-10-CM

## 2021-11-04 MED ORDER — FUROSEMIDE 20 MG PO TAB
20 mg | ORAL_TABLET | Freq: Every morning | ORAL | 3 refills | 90.00000 days | Status: AC
Start: 2021-11-04 — End: ?

## 2021-11-14 ENCOUNTER — Encounter: Admit: 2021-11-14 | Discharge: 2021-11-14 | Payer: MEDICARE

## 2021-11-14 ENCOUNTER — Ambulatory Visit: Admit: 2021-11-14 | Discharge: 2021-11-14 | Payer: MEDICARE

## 2021-11-14 ENCOUNTER — Ambulatory Visit: Admit: 2021-11-14 | Discharge: 2021-11-15 | Payer: MEDICARE

## 2021-11-14 DIAGNOSIS — C220 Liver cell carcinoma: Secondary | ICD-10-CM

## 2021-11-14 DIAGNOSIS — C801 Malignant (primary) neoplasm, unspecified: Secondary | ICD-10-CM

## 2021-11-14 DIAGNOSIS — K219 Gastro-esophageal reflux disease without esophagitis: Secondary | ICD-10-CM

## 2021-11-14 DIAGNOSIS — D899 Disorder involving the immune mechanism, unspecified: Secondary | ICD-10-CM

## 2021-11-14 DIAGNOSIS — J439 Emphysema, unspecified: Secondary | ICD-10-CM

## 2021-11-14 DIAGNOSIS — K759 Inflammatory liver disease, unspecified: Secondary | ICD-10-CM

## 2021-11-14 DIAGNOSIS — L570 Actinic keratosis: Secondary | ICD-10-CM

## 2021-11-14 DIAGNOSIS — D849 Immunodeficiency, unspecified: Secondary | ICD-10-CM

## 2021-11-14 DIAGNOSIS — I8511 Secondary esophageal varices with bleeding: Secondary | ICD-10-CM

## 2021-11-14 DIAGNOSIS — C499 Malignant neoplasm of connective and soft tissue, unspecified: Secondary | ICD-10-CM

## 2021-11-14 DIAGNOSIS — K5790 Diverticulosis of intestine, part unspecified, without perforation or abscess without bleeding: Secondary | ICD-10-CM

## 2021-11-14 DIAGNOSIS — K7682 Hepatic encephalopathy (HCC): Secondary | ICD-10-CM

## 2021-11-14 DIAGNOSIS — C4402 Squamous cell carcinoma of skin of lip: Secondary | ICD-10-CM

## 2021-11-14 DIAGNOSIS — D1801 Hemangioma of skin and subcutaneous tissue: Secondary | ICD-10-CM

## 2021-11-14 DIAGNOSIS — L821 Other seborrheic keratosis: Secondary | ICD-10-CM

## 2021-11-14 DIAGNOSIS — Z8589 Personal history of malignant neoplasm of other organs and systems: Secondary | ICD-10-CM

## 2021-11-14 DIAGNOSIS — D229 Melanocytic nevi, unspecified: Secondary | ICD-10-CM

## 2021-11-14 DIAGNOSIS — R911 Solitary pulmonary nodule: Secondary | ICD-10-CM

## 2021-11-14 DIAGNOSIS — K7581 Nonalcoholic steatohepatitis (NASH): Secondary | ICD-10-CM

## 2021-11-14 NOTE — Progress Notes
ATTESTATION    I personally performed the key portions of the E/M visit, discussed case with resident and concur with resident documentation of history, physical exam, assessment, and treatment plan unless otherwise noted.    I performed cryotherapy today: liquid nitrogen was applied for 10-12 seconds to the skin lesions and the expected blistering or scabbing reaction explained. Pt instructed not to pick at the area(s) and that hypopigmented scars may result from the procedure. Return if lesion fails to fully resolve. Patient tolerated procedure well, no complications.       Staff name:  Georgiann Hahn Date:  11/14/2021

## 2021-11-14 NOTE — Patient Instructions
Vitamin D  - We recommend Vitamin D supplementation after a fatty meal, especially if there is no contraindications such as kidney stones    Moles  --Common melanocytic nevi (moles) tend to be =6 mm in diameter and symmetric with even pigmentation, round or oval shape, regular outline, and sharp, non-fuzzy border.  --Dermal nevi stick out from the skin, but if they are soft they are usually not worrisome.  --Flaky seemingly stuck-on brown bumps are usually benign keratoses, not moles.  --Bright red smooth bumps that do not bleed are usually benign blood vessel lesions (cherry angiomas), not moles.  --Atypical nevi/clinical features of possible melanoma include asymmetry, border irregularities, color variability, and diameter >6 mm.  The earliest sign of melanoma is usually a rapidly growing mole.  --About half of melanoma arises in an existing mole and up to half on normal skin.  --It is normal to get new moles until the age of 7-45 years old.  --Multiple atypical nevi are a marker of increased risk of melanoma. The risk of melanoma depends also upon the total number of nevi, family and/or personal history of melanoma, and sun exposure history.   --It is important to look at your moles once a month.  It may help to follow them with photos such as on your smart phone.  Looking once a month you can notice rapid changes and call if these occur.  --Using sunscreens and sun avoidance will decrease your risk of developing melanoma.  The best sun protection is sun avoidance, including with clothing such as long sleeves and a broad brimmed hat.  The best sunscreens are SPF 30 or above cream based with zinc oxide.  One ounce (shot glass sized) amount is needed for an adult.  It should be reapplied every 2 hours if possible.  --Any tanning bed use will increase your risk of melanoma significantly.    Sun Protection   UPF/SPF rated clothing (gloves, long sleeves, scarves); broad-brimmed hats (NOT ball caps!)   [employer] additive can increase the SPF value of your everyday clothing   Cowboy hats protect from sun; baseball hats don't   Here are several recommended zinc-based sunscreen brands in aplphabetical order (always read the ingredient list, as many brands have multiple varieties of sunscreens and not all are zinc-based)   Cyndia Bent, Coca [employer]a, [employer], Culp, [place], Goddess Garden, Green Screen,  [employer], Forensic scientist, SkinCeuticals, Vanicream   2 shot-glases = whole body    Melanoma Patient Information    Also called malignant melanoma     Skin cancer screening: If you notice a mole that differs from others or one that changes, bleeds, or itches, see a dermatologist.   Melanoma is a type of skin cancer. Anyone can get melanoma. When found early and treated, the cure rate is nearly 100%. Allowed to grow, melanoma can spread to other parts of the body. Melanoma can spread quickly. When melanoma spreads, it can be deadly.Dermatologists believe that the number of deaths from melanoma would be much lower if people:  Knew the warning signs of melanoma.   Learned how to examine their skin for signs of skin cancer.   Took the time to examine their skin.   It's important to take time to look at the moles on your skin because this is a good way to find melanoma early. When checking your skin, you should look for the ABCDEs of melanoma.     ABCDE's of melanoma:  When performing monthly  skin exams for your moles or new moles, remember the ABCDE's of melanoma:    A - Asymmetry. (Concerning if spot is not symmetric)  B - Border. (Irregular border or notched border are concerning)  C - Color. (Multiple colors or changes in color are concerning.)  D - Diameter. (Larger than 6mm, ie, a pencil eraser, is concerning.)  E - Evolution. (An evolving or changing spot is concerning. If new itch, tenderness, or bleeding develop, these are concerning changes too.  See further explanation below:    Melanoma: Signs and symptoms Anyone can get melanoma. It's important to take time to look at the moles on your skin because this is a good way to find melanoma early. When checking your skin, you should look for the ABCDEs of melanoma.     ABCDEs of melanoma     A = Asymmetry  One half is unlike the other half.       B = Border  An irregular, scalloped, or poorly defined border.       C = Color  Is varied from one area to another; has shades of tan, brown or black, or is sometimes white, red, or blue.       D = Diameter  Melanomas usually greater than 6mm (the size of a pencil eraser) when diagnosed, but they can be smaller.       E = Evolving  A mole or skin lesion that looks different from the rest or is changing in size, shape, or color.    !! If you see a mole or new spot on your skin that has any of the ABCDEs, immediately make an appointment to see a dermatologist.    Signs of melanoma  The most common early signs (what you see) of melanoma are:     Growing mole on your skin.   Unusual looking mole on your skin or a mole that does not look like any other mole on your skin (the ugly duckling).   Non-uniform mole (has an odd shape, uneven or uncertain border, different colors).     Symptoms of melanoma  In the early stages, melanoma may not cause any symptoms (what you feel). But sometimes melanoma will:    Itch.    Bleed.    Feel painful.   Many melanomas have these signs and symptoms, but not all. There are different types of melanoma. One type can first appear as a brown or black streak underneath a fingernail or toenail. Melanoma also can look like a bruise that just won't heal.     Who gets melanoma?  Anyone can get melanoma. Most people who get it have light skin, but people who have brown and black skin also get melanoma.   Some people have a higher risk of getting melanoma. These people have the following traits:   Skin    Fair skin (The risk is higher if the person also has red or blond hair and blue or green eyes). Sun-sensitive skin (rarely tans or burns easily).    50-plus moles, large moles, or unusual-looking moles.    If you have had bad sunburns or spent time tanning (sun, tanning beds, or sun lamps), you also have a higher risk of getting melanoma.   Men older than 50 are at a higher risk for developing skin cancers, including melanoma. Learning how to check your skin and getting skin exams can help detect skin cancer.    Family/medical history   Melanoma runs  in the family (parent, child, sibling, cousin, aunt, uncle had melanoma).   You had another skin cancer, but most especially another melanoma.   A weakened immune system.      Research shows that indoor tanning increases a person's melanoma risk by 75%. The risk also may increase if you had breast or thyroid cancer.    More people getting melanoma  Fewer people are getting most types of cancer. Melanoma is different. More people are getting melanoma. Many are white men who are 50 years or older. More young people also are getting melanoma. Melanoma is now the most common cancer among people 19-66 years old. Even teenagers are getting melanoma.    What causes melanoma?  Ultraviolet (UV) radiation is a major contributor in most cases. We get UV radiation from the sun, tanning beds, and sun lamps. Heredity also plays a role. Research shows that if a close blood relative (parent, child, sibling, aunt, uncle) had melanoma, a person has a much greater risk of getting melanoma.     How do dermatologists diagnose melanoma?  To diagnose melanoma, a dermatologist begins by looking at the patient's skin. A dermatologist will carefully examine moles and other suspicious spots. To get a better look, a dermatologist may use a device called a dermoscope.      Vitamin D    Our bodies need vitamin D to build strong and healthy bones. Vitamin D helps the body absorb the calcium that our bones require.     For a healthy person, the recommended daily dietary allowance is 600 international units for people of 56-6 years old, and 800 international units for people older than 71 years. More vitamin D is not better. Higher amounts of vitamin D could be harmful, leading to many health problems such as high blood pressure and kidney damage.     American academy of Dermatology recommending everyone get vitamin D from foods naturally rich in vitamin D, foods and beverages fortified with vitamin D or vitamin D supplements. The foods that contain the greatest amount are fatty fishes such as salmon, tuna and mackerel. Fish liver oil is another good source.     One of the sources to look up vitamin amount is through [employer]. PrankT[employer]. This can help you find out whether you get enough vitamin D from your diet. If you are like many people, you may not be getting your recommended dietary allowance of vitamin D. You may want to change the foods that you eat or take a vitamin D supplements. Before you start taking a vitamin D supplement, talk with your doctor.     Vitamin D is produced in the skin by UV light, but the amount is highly variable and depends on many factors. However, getting vitamin D from the sun or tanning beds can 1) increase your risk of developing skin cancer including melanoma which can be deadly, 2) resulting premature skin aging (wrinkles, age spots, blotchy complexion) and 3) leading to a weakened immune system. Therefore, Teacher, music of Dermatology recommend getting vitamin D safely from foods, beverages and supplements.

## 2021-11-14 NOTE — Progress Notes
Date of Service: 11/14/2021    Subjective:             Sarah Pineda is a 71 y.o. female.    History of Present Illness  History reviewed from 04/2021 visitation with Dr. Demetrios Isaacs and is unchanged unless otherwise noted.     # Multiple melanocytic nevi  - Patient has a history of brown and tan spots distributed over the head, trunk, arms and legs.?   - These have been present for many years.?   - These get darker with sun exposure.?   - There is?no history of blistering sunburns.     # Hx of squamous cell cancer?x2  - upper cutaneous lip removed by Dr. Robby Sermon in 2010  - last one 2012 on nose?s/p surgery  ?  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  #History of Non-small cell lung cancer      Review of Systems   Constitutional: Negative for appetite change and unexpected weight change.   Gastrointestinal: Negative for diarrhea, nausea and vomiting.   Skin: Negative for color change, pallor, rash and wound.         Objective:         ? aspirin EC 81 mg tablet Take one tablet by mouth daily after lunch. Take with food.   As of 08/08/21: ON HOLD due to all procedures/appointments. Last dose > 1 month ago. Pt to check in with Dr. Ladona Ridgel about resumption and when/if appropriate to start again.   ? azaTHIOprine (IMURAN) 50 mg tablet Take one tablet by mouth at bedtime daily.   ? calcium citrate/vitamin D3 (CALCIUM CITRATE + D PO) Take 1 tablet by mouth twice daily.   ? CONSTULOSE 10 gram/15 mL oral solution TAKE 30 ML BY MOUTH TWO TO THREE TIMES DAILY (TITRATES TO 2 - 3 BOWEL MOVEMENTS DAILY)   ? furosemide (LASIX) 20 mg tablet Take one tablet by mouth every morning.   ? omeprazole DR (PRILOSEC) 20 mg capsule Take one capsule by mouth daily before breakfast.   ? ondansetron HCL (ZOFRAN) 4 mg tablet Take one tablet by mouth every 6 hours as needed for Nausea or Vomiting.   ? ondansetron HCL (ZOFRAN) 8 mg tablet Take one tablet by mouth every 8 hours as needed (nausea and vomiting).   ? oxyCODONE (ROXICODONE) 5 mg tablet Take one tablet by mouth every 6 hours as needed for Pain.   ? oxyCODONE-acetaminophen (PERCOCET) 7.5-325 mg tablet Take one tablet by mouth every 12 hours as needed for Pain.   ? spironolactone (ALDACTONE) 50 mg tablet Take one tablet by mouth daily with breakfast. Take with food.   ? XIFAXAN 550 mg tablet TAKE 1 TABLET BY MOUTH EVERY 12 HOURS     Vitals:    11/14/21 1053   PainSc: Zero   Weight: 78.5 kg (173 lb)   Height: 167.6 cm (5' 6)     Body mass index is 27.92 kg/m?Marland Kitchen     Physical Exam  Areas Examined (all normal unless noted below):  Head/Face  Neck  Chest/axillae  Back  Abdomen  Buttocks  R upper ext  L upper ext  L lower ext    Pt declines examination of groin/genitals, breasts today.     Pertinent findings include:  General: Alert and Oriented x 3, Well-nourished  Eyes: Normal Conjunctivae, EOMI  Psych: normal mood    Right above the knee amputation noted     Multiple brown and tan evenly pigmented macules are distributed over  the examined areas.  All have symmetric similar dermascopic findings with primarily globular and reticular patterns.    Soft, pigmented, stuck-on-appearing papules are distributed over the examined areas.  All have symmetric pebbled dermoscopic findings.     Bright red, round to oval, dome-shaped papules distributed over the examined areas.     Scar at site of prior squamous cell carcinoma is noted with no evidence of recurrence.       Red gritty papule on on the right tragus        Assessment and Plan:  #History of SCC  - No evidence of recurrence.  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   - Reviewed increased risk of developing skin cancers  - Continue routine skin checks    # Seborrheic Keratoses  - reassurance, explained benign nature     #Cherry Angiomas  - reassurance of benign nature    #Melanocytic Nevi w/ +Hx Blistering Sunburns  - Reassured examined lesions appear normal  - counseled lesions that change in size, shape, color or are tender warrant further evaluation  - counseled on zinc-based sunscreen SPF 30 or greater  - counseled on wearing wide-brimmed hats and sun protective clothing  - recommend vitamin D3 1000 IU/day  - RTC for new/changing lesions    RTC 6 months         Liquid Nitrogen Procedure Note    Risk and benefits of the above procedure including pain, dyspigmentation, scar, infection, recurrence were discussed with the patient (or legal guardian) in detail, who afterwards decided to proceed with the procedure.    Verbal informed consent given  Diagnosis: actinic keratosis   Body site: see progress note   Number of lesions: x1  Cycle duration: 10 sec  Number or cycles: 2   Wound care instructions given: Yes  Complications:  None  Tolerated well:  Yes  Ambulated from room:  Yes  Duration of procedure: > 

## 2021-11-15 ENCOUNTER — Ambulatory Visit: Admit: 2021-11-15 | Discharge: 2021-11-15 | Payer: MEDICARE

## 2021-11-15 DIAGNOSIS — K7469 Other cirrhosis of liver: Secondary | ICD-10-CM

## 2021-11-15 DIAGNOSIS — R188 Other ascites: Secondary | ICD-10-CM

## 2021-11-15 DIAGNOSIS — K754 Autoimmune hepatitis: Secondary | ICD-10-CM

## 2021-11-15 DIAGNOSIS — K7682 Hepatic encephalopathy (HCC): Secondary | ICD-10-CM

## 2021-11-15 DIAGNOSIS — C220 Liver cell carcinoma: Secondary | ICD-10-CM

## 2021-11-15 DIAGNOSIS — I8511 Secondary esophageal varices with bleeding: Secondary | ICD-10-CM

## 2021-11-15 NOTE — Progress Notes
Spoke with pt.'s daughter, Elmarie Shiley. Scheduled EGD. Reviewed procedure/prep instructions, meds and need for a driver. Also discussed driver/visitor policy. She verbalized understanding. Instructions sent to My Chart.   EGD (ESOPHAGOGASTRODUODENOSCOPY) PREP      Upper GI endoscopy allows healthcare providers to look directly into the beginning of your gastrointestinal(GI) tract.  The esophagus, stomach, and duodenum (first part of the small intestine) make up the upper GI tract.      1 Week Prior:  If you are taking a weekly GLP-1 agonist (ex. Ozempic, Mounjaro)  do not take this medication the week prior.     5 Days Prior:  Check with your prescribing physician for instructions about stopping your blood thinner.  Examples of blood thinners are Aleve, Aspirin. Coumadin, Eliquis, Ibuprofen, Naproxen, Plavix, and Xarelto.  Do not give yourself a Lovenox injection the morning of the test. Lovenox injections may be taken as usual through the day before your test.    Day of Exam:  Do not eat or drink anything after midnight the night before your exam. However, if your exam is in the afternoon you may drink clear liquids only up until (4) hours before your scheduled procedure time.  After this, you should have nothing by mouth.  This includes GUM or CANDY.   Chewing tobacco must be stopped  (6) hours before your scheduled procedure.   If you have an early morning test, take ONLY your essential morning medications (heart, blood pressure, seizure, etc.) with a small sip of water.   You will be sedated for the procedure. A responsible adult must drive you home (no Benedetto Goad, taxis, or buses are permitted). If you do not have a driver we will be unable to do the test.   If you are taking a daily GLP-1 agonist (ex. Ozempic, Mounjaro)  do not take this medication the day of the test.   You will be here for (3-4) hours from arrival time.   You will not be able to return to work the same day.   Please bring a list of your current medications and the dosages with you.     The Procedure:  You will lie on the endoscopy table. Usually patients lie on the left side.  You will be monitored and given oxygen.   You are given sedation (relaxing) medication through an intravenous (IV) line.  The healthcare provider will put the endoscope in your mouth and down your esophagus. It is thinner than most pieces of food that you swallow.  It will not affect your breathing. The medicine helps keep you from gagging.   Air is inserted to expand your GI tract. It can make you burp.  During the procedure, the healthcare provider can take biopsies (tissue samples), remove abnormalities such as polyps, or treat abnormalities though a variety of devices placed through the endoscope. You will not feel this.   The endoscope carries images of your upper GI tract to a video screen.  An adult must drive you home.

## 2021-11-25 ENCOUNTER — Ambulatory Visit: Admit: 2021-11-25 | Discharge: 2021-11-25 | Payer: MEDICARE

## 2021-11-25 ENCOUNTER — Encounter: Admit: 2021-11-25 | Discharge: 2021-11-25 | Payer: MEDICARE

## 2021-11-25 DIAGNOSIS — C4402 Squamous cell carcinoma of skin of lip: Secondary | ICD-10-CM

## 2021-11-25 DIAGNOSIS — C499 Malignant neoplasm of connective and soft tissue, unspecified: Secondary | ICD-10-CM

## 2021-11-25 DIAGNOSIS — K7581 Nonalcoholic steatohepatitis (NASH): Secondary | ICD-10-CM

## 2021-11-25 DIAGNOSIS — J439 Emphysema, unspecified: Secondary | ICD-10-CM

## 2021-11-25 DIAGNOSIS — K219 Gastro-esophageal reflux disease without esophagitis: Secondary | ICD-10-CM

## 2021-11-25 DIAGNOSIS — C801 Malignant (primary) neoplasm, unspecified: Secondary | ICD-10-CM

## 2021-11-25 DIAGNOSIS — C220 Liver cell carcinoma: Secondary | ICD-10-CM

## 2021-11-25 DIAGNOSIS — K5790 Diverticulosis of intestine, part unspecified, without perforation or abscess without bleeding: Secondary | ICD-10-CM

## 2021-11-25 DIAGNOSIS — R911 Solitary pulmonary nodule: Secondary | ICD-10-CM

## 2021-11-25 DIAGNOSIS — K759 Inflammatory liver disease, unspecified: Secondary | ICD-10-CM

## 2021-11-25 DIAGNOSIS — D899 Disorder involving the immune mechanism, unspecified: Secondary | ICD-10-CM

## 2021-11-25 MED ORDER — PROPOFOL INJ 10 MG/ML IV VIAL
INTRAVENOUS | 0 refills | Status: DC
Start: 2021-11-25 — End: 2021-11-25

## 2021-11-25 MED ORDER — LIDOCAINE (PF) 20 MG/ML (2 %) IJ SOLN
INTRAVENOUS | 0 refills | Status: DC
Start: 2021-11-25 — End: 2021-11-25

## 2021-11-25 MED ORDER — PROPOFOL 10 MG/ML IV EMUL 20 ML (INFUSION)(AM)(OR)
INTRAVENOUS | 0 refills | Status: DC
Start: 2021-11-25 — End: 2021-11-25

## 2021-11-25 MED ADMIN — LACTATED RINGERS IV SOLP [4318]: 1000.000 mL | INTRAVENOUS | @ 17:00:00 | Stop: 2021-11-25 | NDC 00338011704

## 2021-11-25 NOTE — Anesthesia Post-Procedure Evaluation
Post-Anesthesia Evaluation    Name: Sarah Pineda      MRN: [id number]     DOB: Mar 08, 1950     Age: 71 y.o.     Sex: female   __________________________________________________________________________     Procedure Information     Anesthesia Start Date/Time: 11/25/21 1204    Procedure: ESOPHAGOGASTRODUODENOSCOPY WITH SPECIMEN COLLECTION BY BRUSHING/ WASHING    Location: END[facility] / ENDO/GI    Surgeons: Dawna Part, MD          Post-Anesthesia Vitals  BP: 116/78 (11/03 1245)  Temp: 36.4 C (97.6 F) (11/03 1220)  Pulse: 81 (11/03 1245)  Respirations: 22 PER MINUTE (11/03 1245)  SpO2: 98 % (11/03 1245)  O2 Device: None (Room air) (11/03 1245)   Vitals Value Taken Time   BP 116/78 11/25/21 1245   Temp 36.4 C (97.6 F) 11/25/21 1220   Pulse 81 11/25/21 1245   Respirations 22 PER MINUTE 11/25/21 1245   SpO2 98 % 11/25/21 1245   O2 Device None (Room air) 11/25/21 1245   ABP     ART BP           Post Anesthesia Evaluation Note    Evaluation location: Pre/Post  Patient participation: recovered; patient participated in evaluation  Level of consciousness: alert    Pain score: 0  Pain management: adequate    Hydration: normovolemia  Temperature: 36.0C - 38.4C  Airway patency: adequate    Perioperative Events       Post-op nausea and vomiting: no PONV    Postoperative Status  Cardiovascular status: hemodynamically stable  Respiratory status: spontaneous ventilation  Follow-up needed: none        Perioperative Events  There were no known notable events for this encounter.

## 2021-11-26 ENCOUNTER — Encounter: Admit: 2021-11-26 | Discharge: 2021-11-26 | Payer: MEDICARE

## 2021-11-26 DIAGNOSIS — J439 Emphysema, unspecified: Secondary | ICD-10-CM

## 2021-11-26 DIAGNOSIS — R911 Solitary pulmonary nodule: Secondary | ICD-10-CM

## 2021-11-26 DIAGNOSIS — C220 Liver cell carcinoma: Secondary | ICD-10-CM

## 2021-11-26 DIAGNOSIS — K759 Inflammatory liver disease, unspecified: Secondary | ICD-10-CM

## 2021-11-26 DIAGNOSIS — K5790 Diverticulosis of intestine, part unspecified, without perforation or abscess without bleeding: Secondary | ICD-10-CM

## 2021-11-26 DIAGNOSIS — D899 Disorder involving the immune mechanism, unspecified: Secondary | ICD-10-CM

## 2021-11-26 DIAGNOSIS — C499 Malignant neoplasm of connective and soft tissue, unspecified: Secondary | ICD-10-CM

## 2021-11-26 DIAGNOSIS — K219 Gastro-esophageal reflux disease without esophagitis: Secondary | ICD-10-CM

## 2021-11-26 DIAGNOSIS — C4402 Squamous cell carcinoma of skin of lip: Secondary | ICD-10-CM

## 2021-11-26 DIAGNOSIS — K7581 Nonalcoholic steatohepatitis (NASH): Secondary | ICD-10-CM

## 2021-11-26 DIAGNOSIS — C801 Malignant (primary) neoplasm, unspecified: Secondary | ICD-10-CM

## 2021-12-14 ENCOUNTER — Encounter: Admit: 2021-12-14 | Discharge: 2021-12-14 | Payer: MEDICARE

## 2021-12-14 ENCOUNTER — Ambulatory Visit: Admit: 2021-12-14 | Discharge: 2021-12-14 | Payer: MEDICARE

## 2021-12-14 DIAGNOSIS — C220 Liver cell carcinoma: Secondary | ICD-10-CM

## 2021-12-14 DIAGNOSIS — R911 Solitary pulmonary nodule: Secondary | ICD-10-CM

## 2021-12-14 DIAGNOSIS — K7581 Nonalcoholic steatohepatitis (NASH): Secondary | ICD-10-CM

## 2021-12-14 DIAGNOSIS — K759 Inflammatory liver disease, unspecified: Secondary | ICD-10-CM

## 2021-12-14 DIAGNOSIS — K219 Gastro-esophageal reflux disease without esophagitis: Secondary | ICD-10-CM

## 2021-12-14 DIAGNOSIS — C3492 Malignant neoplasm of unspecified part of left bronchus or lung: Secondary | ICD-10-CM

## 2021-12-14 DIAGNOSIS — K5790 Diverticulosis of intestine, part unspecified, without perforation or abscess without bleeding: Secondary | ICD-10-CM

## 2021-12-14 DIAGNOSIS — J439 Emphysema, unspecified: Secondary | ICD-10-CM

## 2021-12-14 DIAGNOSIS — C3412 Malignant neoplasm of upper lobe, left bronchus or lung: Secondary | ICD-10-CM

## 2021-12-14 DIAGNOSIS — D899 Disorder involving the immune mechanism, unspecified: Secondary | ICD-10-CM

## 2021-12-14 DIAGNOSIS — C499 Malignant neoplasm of connective and soft tissue, unspecified: Secondary | ICD-10-CM

## 2021-12-14 DIAGNOSIS — C801 Malignant (primary) neoplasm, unspecified: Secondary | ICD-10-CM

## 2021-12-14 DIAGNOSIS — K754 Autoimmune hepatitis: Secondary | ICD-10-CM

## 2021-12-14 DIAGNOSIS — C4402 Squamous cell carcinoma of skin of lip: Secondary | ICD-10-CM

## 2021-12-14 LAB — COMPREHENSIVE METABOLIC PANEL
ALBUMIN: 4.1 g/dL (ref 3.5–5.0)
ALK PHOSPHATASE: 106 U/L (ref 25–110)
ALT: 14 U/L (ref 7–56)
ANION GAP: 6 K/UL (ref 3–12)
AST: 27 U/L — ABNORMAL LOW (ref 7–40)
BLD UREA NITROGEN: 13 mg/dL (ref 7–25)
CALCIUM: 9.4 mg/dL — ABNORMAL LOW (ref 8.5–10.6)
CHLORIDE: 100 MMOL/L (ref 98–110)
CO2: 28 MMOL/L (ref 21–30)
CREATININE: 1 mg/dL — ABNORMAL HIGH (ref 0.4–1.00)
GLUCOSE,PANEL: 128 mg/dL — ABNORMAL HIGH (ref 70–100)
POTASSIUM: 4.3 MMOL/L (ref 3.5–5.1)
SODIUM: 134 MMOL/L — ABNORMAL LOW (ref 137–147)
TOTAL BILIRUBIN: 0.5 mg/dL (ref 0.3–1.2)
TOTAL PROTEIN: 7.2 g/dL (ref 6.0–8.0)

## 2021-12-14 LAB — PROTIME INR (PT)
INR: 1.1 (ref 0.8–1.2)
PROTIME: 12 s (ref 9.5–14.2)

## 2021-12-14 LAB — POC CREATININE, RAD: CREATININE, POC: 1 mg/dL (ref 0.4–1.00)

## 2021-12-14 LAB — CBC AND DIFF
HEMATOCRIT: 36 % (ref 36–45)
HEMOGLOBIN: 12 g/dL (ref 12.0–15.0)
MCV: 92 FL (ref 80–100)
RBC COUNT: 3.9 M/UL — ABNORMAL LOW (ref 4.0–5.0)
WBC COUNT: 5.5 K/UL (ref 4.5–11.0)

## 2021-12-14 LAB — ALPHA FETO PROTEIN (AFP), NON-PREGNANT: AFP: 4.9 ng/mL — ABNORMAL LOW (ref >60)

## 2021-12-14 MED ORDER — SODIUM CHLORIDE 0.9 % IJ SOLN
50 mL | Freq: Once | INTRAVENOUS | 0 refills | Status: CP
Start: 2021-12-14 — End: ?

## 2021-12-14 MED ORDER — IOHEXOL 350 MG IODINE/ML IV SOLN
100 mL | Freq: Once | INTRAVENOUS | 0 refills | Status: CP
Start: 2021-12-14 — End: ?

## 2021-12-14 NOTE — Progress Notes
Telehealth Visit Note    Date of Service: 12/14/2021    Subjective:           Sarah Pineda is a 71 y.o. female.    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 She then?presented?with an?11 mm irregular pulmonary nodule in the left upper lobe?on chest CT and?significant increase FDG uptake?with maximum SUV of 9.5?on PET scan. She was treated with SBRT to 5000 cGy in 5 fractions, completed on 01/09/2020.  ?  ?  Interval history:  Currently on surveillance of HCC.  Recent abdominal scan demonstrated a 6 mm enhancement focus in segment 5/6 and CT chest showed increase in size of a couple left lung nodules that most likely represent metastases  Tumor board discussion suggested needle biopsy of lung nodule to rule out primary lung cancer versus metastatic from [facility].  Lung nodule biopsy showed recurrence of her squamous cell carcinoma of the lung. Liver biopsy showed hepatocellular carcinoma s/p microwave ablation completed on 08/11/2021. Pet scan scheduled today showed development of increased FDG uptake in the left apical nodule with SUV max of 12.1 and mild FDG uptake at prior wedge resection site with SUV max of 3.7. No hypermetabolic lesions in the liver.  The patient is overall doing well without major complaints.  She denies worsening shortness of breath, chest pain, fever or chills.    NM PET SCAN TORSO (SKULL-THIGHS)  Result Date: 08/30/2021   1. Mild diffuse FDG uptake at the prior wedge resection site, likely post therapeutic changes. 2. Increased FDG uptake in the left apical nodule, which could be synchronous primary lung cancer or metastatic disease.    She was treated with SBRT to left apical nodule, completed on 10/10/2021.        Review of Systems   All other systems reviewed and are negative.      .  Objective:         ? aspirin EC 81 mg tablet Take one tablet by mouth daily after lunch. Take with food.   As of 08/08/21: ON HOLD due to all procedures/appointments. Last dose > 1 month ago. Pt to check in with Dr. Ladona Ridgel about resumption and when/if appropriate to start again.   ? azaTHIOprine (IMURAN) 50 mg tablet Take one tablet by mouth at bedtime daily.   ? calcium citrate/vitamin D3 (CALCIUM CITRATE + D PO) Take 1 tablet by mouth twice daily.   ? CONSTULOSE 10 gram/15 mL oral solution TAKE 30 ML BY MOUTH TWO TO THREE TIMES DAILY (TITRATES TO 2 - 3 BOWEL MOVEMENTS DAILY)   ? furosemide (LASIX) 20 mg tablet Take one tablet by mouth every morning.   ? omeprazole DR (PRILOSEC) 20 mg capsule Take one capsule by mouth daily before breakfast.   ? ondansetron HCL (ZOFRAN) 4 mg tablet Take one tablet by mouth every 6 hours as needed for Nausea or Vomiting.   ? ondansetron HCL (ZOFRAN) 8 mg tablet Take one tablet by mouth every 8 hours as needed (nausea and vomiting).   ? oxyCODONE (ROXICODONE) 5 mg tablet Take one tablet by mouth every 6 hours as needed for Pain.   ? oxyCODONE-acetaminophen (PERCOCET) 7.5-325 mg tablet Take one tablet by mouth every 12 hours as needed for Pain.   ? spironolactone (ALDACTONE) 50 mg tablet Take one tablet by mouth daily with  breakfast. Take with food.   ? XIFAXAN 550 mg tablet TAKE 1 TABLET BY MOUTH EVERY 12 HOURS           Computed Telehealth Body Mass Index unavailable. Necessary lab results were not found in the last year.    Physical Exam    12/14/2021  CHEST:   1. ?Left upper lobe wedge resection with unchanged mild adjacent scarring.   No new or enlarging measurable mass at this site.     2. Significant decrease in size of left apical biopsy-proven squamous cell   carcinoma metastasis post radiation. Similar decrease in size of   indeterminate left lower lobe pulmonary nodule.     3. No thoracic lymphadenopathy.     4. No significant change in several indeterminate sub-5 mm pulmonary   nodules.     5. Pathologic fracture deformity of the left lateral third rib with   increased lytic appearance, nonspecific and may be from osteonecrosis from   prior radiation or a metastasis. ?     ABDOMEN:   1. ?Previous ablation of caudal right hepatic hepatocellular carcinoma. No   appreciable nodular enhancement or washout to suggest recurrent tumor.   LI-RADS treated, nonviable.     2. No significant change in subtle subcentimeter arterial enhancing   LI-RADS 3 observations. No appreciable washout.     3. 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 No   significant splenomegaly or ascites.     4. No upper abdominal metastatic disease.     5. Subcentimeter nonobstructing renal calculi           Assessment and Plan:  1. CT showed no recurrence nor metastasis.  2. Pathologic fracture deformity of the left lateral third rib with increased lytic appearance, nonspecific and may be from osteonecrosis from   prior radiation or a metastasis. ?most likely a rib damage from SBRT.  No chest wall pain.  3. 6 months follow up with CT.                         20 minutes spent on this patient's encounter with counseling and coordination of care taking >50% of the visit.

## 2021-12-14 NOTE — Progress Notes
IV placed in radiology. Able to obtain blood return for blood drawn from right AC IV. Line flushed and removed per protocol.

## 2021-12-14 NOTE — Progress Notes
Name: Sarah Pineda          MRN: [id number]      DOB: 1950-08-02      AGE: 71 y.o.   DATE OF SERVICE: 12/14/2021    Subjective:             Reason for Visit:  Follow Up      Sarah Pineda is a 71 y.o. female.      Cancer Staging   Hepatocellular carcinoma (HCC)  Staging form: Liver, AJCC 8th Edition  - Clinical: cT1, cN0, cM0 - Signed by Dayton Bailiff, MD on 06/06/2017    Non-small cell lung cancer (HCC)  Staging form: Lung, AJCC 8th Edition  - Pathologic: Stage IA2 (pT1b, pN0, cM0) - Signed by Dayton Bailiff, MD on 08/03/2017  - Pathologic: No stage assigned - Unsigned        History of Present Illness     [name] She reports no issues with the radiation treatment and her breathing is generally okay. However, she experiences some coughing in the mornings, which does not last throughout the day. The patient denies any blood or phlegm accompanying the cough.    The patient's last radiation session was on September 18th, and she has since had a PET scan in August. The scan showed no concerning findings, and the liver lesion that was treated has completely resolved.    The patient has not experienced any falls or injuries but has been informed of a fracture in her left lateral third rib. This fracture was not mentioned in previous scans, and the patient is unsure of its cause. She has not experienced any pain or discomfort in the area of the fracture.    The patient's appetite and energy levels have been stable, and she has not reported any significant weight changes. The patient is scheduled for a follow-up CT scan of the abdomen in three months and will continue to monitor her condition with her healthcare team.    .     Review of Systems   Constitutional: Negative for fatigue, fever and unexpected weight change.   HENT: Negative for facial swelling, mouth sores, sore throat, tinnitus, trouble swallowing and voice change.    Respiratory: Negative for cough, choking, chest tightness, shortness of breath and wheezing.    Cardiovascular: Negative for chest pain, palpitations and leg swelling.   Gastrointestinal: Negative for abdominal distention, abdominal pain, anal bleeding, blood in stool, constipation, diarrhea, nausea, rectal pain and vomiting.   Genitourinary: Negative for difficulty urinating, dysuria, frequency and hematuria.   Musculoskeletal: Negative for arthralgias, back pain, gait problem, joint swelling, myalgias and neck pain.   Skin: Negative for pallor, rash and wound.   Neurological: Negative for dizziness, seizures, syncope, speech difficulty, weakness, light-headedness, numbness and headaches.   Hematological: Negative for adenopathy. Does not bruise/bleed easily.   Psychiatric/Behavioral: Negative for dysphoric mood and sleep disturbance. The patient is not nervous/anxious.          Objective:         ? aspirin EC 81 mg tablet Take one tablet by mouth daily after lunch. Take with food.   As of 08/08/21: ON HOLD due to all procedures/appointments. Last dose > 1 month ago. Pt to check in with Dr. Ladona Ridgel about resumption and when/if appropriate to start again.   ? azaTHIOprine (IMURAN) 50 mg tablet Take one tablet by mouth at bedtime daily.   ? calcium citrate/vitamin D3 (CALCIUM CITRATE + D PO) Take 1 tablet by  mouth twice daily.   ? CONSTULOSE 10 gram/15 mL oral solution TAKE 30 ML BY MOUTH TWO TO THREE TIMES DAILY (TITRATES TO 2 - 3 BOWEL MOVEMENTS DAILY)   ? furosemide (LASIX) 20 mg tablet Take one tablet by mouth every morning.   ? omeprazole DR (PRILOSEC) 20 mg capsule Take one capsule by mouth daily before breakfast.   ? ondansetron HCL (ZOFRAN) 4 mg tablet Take one tablet by mouth every 6 hours as needed for Nausea or Vomiting.   ? ondansetron HCL (ZOFRAN) 8 mg tablet Take one tablet by mouth every 8 hours as needed (nausea and vomiting).   ? oxyCODONE (ROXICODONE) 5 mg tablet Take one tablet by mouth every 6 hours as needed for Pain.   ? oxyCODONE-acetaminophen (PERCOCET) 7.5-325 mg tablet Take one tablet by mouth every 12 hours as needed for Pain.   ? spironolactone (ALDACTONE) 50 mg tablet Take one tablet by mouth daily with breakfast. Take with food.   ? XIFAXAN 550 mg tablet TAKE 1 TABLET BY MOUTH EVERY 12 HOURS     Vitals:    12/14/21 1313   BP: 110/75   BP Source: Arm, Left Upper   Pulse: 98   Temp: 36.1 ?C (97 ?F)   Resp: 16   SpO2: 100%   O2 Device: None (Room air)   TempSrc: Temporal   Weight: 79.2 kg (174 lb 9.6 oz)     Body mass index is 28.18 kg/m?Marland Kitchen                  Pain Addressed:  N/A    Patient Evaluated for a Clinical Trial: No treatment clinical trial available for this patient.     [place] Cooperative Oncology Group performance status is 1, Restricted in physically strenuous activity but ambulatory and able to carry out work of a light or sedentary nature, e.g., light house work, office work.     Physical Exam  Constitutional:       General: She is not in acute distress.     Appearance: Normal appearance. She is well-developed.   HENT:      Head: Normocephalic and atraumatic.   Eyes:      General: No scleral icterus.        Right eye: No discharge.      Pupils: Pupils are equal, round, and reactive to light.   Neck:      Thyroid: No thyromegaly.   Cardiovascular:      Rate and Rhythm: Normal rate and regular rhythm.      Heart sounds: Normal heart sounds. No murmur heard.     No friction rub.   Pulmonary:      Effort: No respiratory distress.      Breath sounds: Normal breath sounds. No wheezing or rales.   Abdominal:      General: Bowel sounds are normal. There is no distension.      Palpations: Abdomen is soft. There is no hepatomegaly, splenomegaly or mass.      Tenderness: There is no abdominal tenderness. There is no guarding or rebound.      Hernia: No hernia is present.   Musculoskeletal:         General: No tenderness.      Cervical back: Full passive range of motion without pain, normal range of motion and neck supple.      Comments: Right lower limb amputation   Lymphadenopathy: Head:      Right side of head: No submental, submandibular,  tonsillar, preauricular, posterior auricular or occipital adenopathy.      Left side of head: No submental, submandibular, tonsillar, preauricular, posterior auricular or occipital adenopathy.      Cervical: No cervical adenopathy.      Upper Body:      Right upper body: No supraclavicular adenopathy.      Left upper body: No supraclavicular adenopathy.   Skin:     General: Skin is warm.      Findings: No petechiae or rash. Rash is not purpuric.   Neurological:      Mental Status: She is alert and oriented to person, place, and time.      Cranial Nerves: No cranial nerve deficit.      Sensory: No sensory deficit.      Coordination: Coordination normal.      Deep Tendon Reflexes: Reflexes are normal and symmetric.   Psychiatric:         Mood and Affect: Mood is not anxious or depressed.         Behavior: Behavior normal.         Thought Content: Thought content normal.         Judgment: Judgment normal.                Assessment and Plan:  Problem List        Oncology    Hepatocellular carcinoma [facility])    Overview       ?  She was admitted to the hospital in 02/2017 with decompensated liver failure. Her MELD at that time was 27. She is currently still on prednisone taper with plans to start azathioprine soon  ?  ?  ?  03/14/17 - AFP = 22.4 and 05/09/17 = 8.3  ?  03/15/17 - MRI ABD impression Redemonstration of 1.7 cm LI RADS 3 observation in segment 5/6 of the liver with arterial enhancement. No appreciable washout or additional significant signal abnormality.  Heterogeneous perfusion pattern throughout the liver without additional discrete mass. Follow-up CT or MRI abdomen (liver protocol) in 3 months recommended. Cirrhosis with small portosystemic varices. No splenomegaly or ascites. ?  ?  03/15/17 Liver biopsy  SURG PATH #: [id number]  A.  Chronic hepatitis with moderate to severe interface and lobular activity with lymphocytic and lympho- plasmacytic infiltrates in the portal tracts and extending into the lobule and areas of confluent hepatocyte necrosis/collapse, compatible with ? ? autoimmune hepatitis. ?   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   See comment.   ?  03/29/17 - Liver biopsy  SURG PATH #: [id number]  A. Liver, mass, biopsy: ? Negative for mass/lesion.  ?  4//8/19 Liver biopsy SURG PATH #: [id number] A. Liver mass, biopsy: Well differentiated hepatocellular carcinoma. ? ?   ?  05/30/17 mri pelvis IMPRESSION ?Three soft tissue nodules within the right pelvis that are unchanged since February 2019. ?These most likely represent metastases from the reported sarcoma. ?Consider biopsy of the largest nodule, as clinically indicated.      ?05/31/17 US Guided bx of the target right gluteal soft tissue nodule  ?Successful ultrasound guided biopsy of the target right gluteal soft tissue mass. ?Four core biopsy specimens submitted to pathology in formalin  ?  05/31/17 PATHOLOGY SURG PATH #: [id number] Diagnosis:  A. Nerve bundles, pelvic lymph node biopsy, core needle biopsy: ? Abundant nerve bundles. See comment.        She is to also have MWA along with TACE for LI-Rads 5 lesion -  08/22/17 Successful CT -guided Microwave Ablation of hepatic segment 5/6 lesion  02/07/19 Successful CT -guided Microwave Ablation of segment 6 liver tumor.               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 Lung, left upper lobe wedge mass, wedge resection: ?   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                 Other    Liver cirrhosis secondary to NASH (HCC)        Autoimmune hepatitis (HCC)                         1. Hepatocellular carcinoma biopsy proven.   - Status post her second chemoembolization tolerated that very well.    - We reviewed her labs and tumor marker which are unremarkable. AFP is 2.1.  - Reviewed her CT AP which is negative, except for stable cirrhosis, no ascites.   - Had EGD on 05/23/2019 which showed esophageal varices, small and non-bleeding, so no intervention needed.    Sarah Pineda is currently on surveillance.  Patient underwent liver directed therapy that shows good response with no viable tumor.  Recommended continuing surveillance.    Plan:  -Follow-up in 3 months with   CT of the abdomen with and without contrast CBC CMP alpha-fetoprotein INR.      2. Abnormal lung nodule of the left upper lobe suspicious for primary neoplasm of the lung .  Squamous cell carcinoma of the lung following with thoracic oncology Dr. 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  CT chest showed increase in size of a couple left lung nodules that most likely represent metastases. Couple sub-5 mm right lung nodules are unchanged.  Seems to have responded to SBRT  Plan:  Follow-up imaging depending on what Dr. Chipper Herb and Dr. Regino Schultze recommend      3. 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     4. Autoimmune hepatitis   - Following up with Dr. Ladona Ridgel well compensated.      Rib Fracture: New finding of a left lateral third rib fracture, likely secondary to radiation therapy.  -Discuss with radiation oncologist Dr. Regino Schultze for further evaluation and management.                 Total of 40 minutes were spent on the same day of the visit including preparing to see the patient, obtaining and/or reviewing separately obtained history, performing a medically appropriate examination and/or evaluation, counseling and educating the patient/family/caregiver, ordering medications, tests, or procedures, referring and communication with other health care professionals, documenting clinical information in the electronic or other health record, independently interpreting results and communicating results to the patient/family/caregiver, and care coordination.

## 2021-12-22 ENCOUNTER — Encounter: Admit: 2021-12-22 | Discharge: 2021-12-22 | Payer: MEDICARE

## 2021-12-22 DIAGNOSIS — C220 Liver cell carcinoma: Secondary | ICD-10-CM

## 2021-12-22 DIAGNOSIS — C3412 Malignant neoplasm of upper lobe, left bronchus or lung: Secondary | ICD-10-CM

## 2022-02-06 ENCOUNTER — Encounter: Admit: 2022-02-06 | Discharge: 2022-02-06 | Payer: MEDICARE

## 2022-02-07 ENCOUNTER — Encounter: Admit: 2022-02-07 | Discharge: 2022-02-07 | Payer: MEDICARE

## 2022-02-07 MED ORDER — SPIRONOLACTONE 50 MG PO TAB
50 mg | ORAL_TABLET | Freq: Every day | ORAL | 3 refills | 90.00000 days | Status: AC
Start: 2022-02-07 — End: ?

## 2022-03-17 ENCOUNTER — Encounter: Admit: 2022-03-17 | Discharge: 2022-03-17 | Payer: MEDICARE

## 2022-03-17 DIAGNOSIS — D899 Disorder involving the immune mechanism, unspecified: Secondary | ICD-10-CM

## 2022-03-17 DIAGNOSIS — R911 Solitary pulmonary nodule: Secondary | ICD-10-CM

## 2022-03-17 DIAGNOSIS — C220 Liver cell carcinoma: Secondary | ICD-10-CM

## 2022-03-17 DIAGNOSIS — K759 Inflammatory liver disease, unspecified: Secondary | ICD-10-CM

## 2022-03-17 DIAGNOSIS — K5790 Diverticulosis of intestine, part unspecified, without perforation or abscess without bleeding: Secondary | ICD-10-CM

## 2022-03-17 DIAGNOSIS — C801 Malignant (primary) neoplasm, unspecified: Secondary | ICD-10-CM

## 2022-03-17 DIAGNOSIS — C3412 Malignant neoplasm of upper lobe, left bronchus or lung: Secondary | ICD-10-CM

## 2022-03-17 DIAGNOSIS — K219 Gastro-esophageal reflux disease without esophagitis: Secondary | ICD-10-CM

## 2022-03-17 DIAGNOSIS — C499 Malignant neoplasm of connective and soft tissue, unspecified: Secondary | ICD-10-CM

## 2022-03-17 DIAGNOSIS — J439 Emphysema, unspecified: Secondary | ICD-10-CM

## 2022-03-17 DIAGNOSIS — K7581 Nonalcoholic steatohepatitis (NASH): Secondary | ICD-10-CM

## 2022-03-17 DIAGNOSIS — C4402 Squamous cell carcinoma of skin of lip: Secondary | ICD-10-CM

## 2022-03-17 DIAGNOSIS — C3492 Malignant neoplasm of unspecified part of left bronchus or lung: Secondary | ICD-10-CM

## 2022-03-17 LAB — CBC AND DIFF
ABSOLUTE LYMPH COUNT: 0.3 K/UL — ABNORMAL LOW (ref 1.0–4.8)
ABSOLUTE MONO COUNT: 0.2 K/UL (ref 0–0.80)
ABSOLUTE NEUTROPHIL: 4.2 K/UL (ref 1.8–7.0)
EOSINOPHILS %: 3 % (ref >60)
HEMATOCRIT: 35 % — ABNORMAL LOW (ref 36–45)
HEMOGLOBIN: 12 g/dL (ref 12.0–15.0)
LYMPHOCYTES %: 7 % — ABNORMAL LOW (ref 24–44)
MCH: 32 pg (ref 26–34)
MCHC: 34 g/dL (ref 32.0–36.0)
MCV: 93 FL (ref 80–100)
MONOCYTES %: 5 % (ref 4–12)
MPV: 9 FL (ref 7–11)
NEUTROPHILS %: 84 % — ABNORMAL HIGH (ref 41–77)
RBC COUNT: 3.7 M/UL — ABNORMAL LOW (ref 4.0–5.0)
RDW: 13 % (ref 11–15)
WBC COUNT: 4.9 K/UL (ref 4.5–11.0)

## 2022-03-17 LAB — COMPREHENSIVE METABOLIC PANEL
POTASSIUM: 4.3 MMOL/L (ref 3.5–5.1)
SODIUM: 137 MMOL/L (ref 137–147)

## 2022-03-17 LAB — PROTIME INR (PT)
INR: 1.1 (ref 0.8–1.2)
PROTIME: 12 s (ref 9.5–14.2)

## 2022-03-17 LAB — POC CREATININE, RAD: CREATININE, POC: 1.1 mg/dL — ABNORMAL HIGH (ref 0.4–1.00)

## 2022-03-17 LAB — ALPHA FETO PROTEIN (AFP), NON-PREGNANT: AFP: 7.2 ng/mL (ref 0.0–15.0)

## 2022-03-17 MED ORDER — SODIUM CHLORIDE 0.9 % IJ SOLN
50 mL | Freq: Once | INTRAVENOUS | 0 refills | Status: CP
Start: 2022-03-17 — End: ?
  Administered 2022-03-17: 18:00:00 50 mL via INTRAVENOUS

## 2022-03-17 MED ORDER — IOHEXOL 350 MG IODINE/ML IV SOLN
100 mL | Freq: Once | INTRAVENOUS | 0 refills | Status: CP
Start: 2022-03-17 — End: ?
  Administered 2022-03-17: 18:00:00 100 mL via INTRAVENOUS

## 2022-03-17 NOTE — Progress Notes
Name: Sarah Pineda          MRN: [id number]      DOB: 01/14/1951      AGE: 72 y.o.   DATE OF SERVICE: 03/17/2022    Subjective:             Reason for Visit:  Follow Up      Sarah Pineda is a 72 y.o. female.      Cancer Staging   Hepatocellular carcinoma (HCC)  Staging form: Liver, AJCC 8th Edition  - Clinical: cT1, cN0, cM0 - Signed by Dayton Bailiff, MD on 06/06/2017    Non-small cell lung cancer (HCC)  Staging form: Lung, AJCC 8th Edition  - Pathologic: Stage IA2 (pT1b, pN0, cM0) - Signed by Dayton Bailiff, MD on 08/03/2017  - Pathologic: No stage assigned - Unsigned        History of Present Illness     [name] presents for a routine follow-up. She reports no new symptoms or complaints, denying any abdominal pain, nausea, vomiting, diarrhea, or bone pain. Her breathing is reported as normal. She has not seen her lung doctor, Dr. Chipper Herb, for a while but has been under the care of Dr. Regino Schultze, who performed her last radiation treatment.  No studies or encephalopathy.  No bleeding bowels are moving normally.  Her appetite is good she is stable.    .     Review of Systems   Constitutional:  Negative for fatigue, fever and unexpected weight change.   HENT:  Negative for facial swelling, mouth sores, sore throat, tinnitus, trouble swallowing and voice change.    Respiratory:  Negative for cough, choking, chest tightness, shortness of breath and wheezing.    Cardiovascular:  Negative for chest pain, palpitations and leg swelling.   Gastrointestinal:  Negative for abdominal distention, abdominal pain, anal bleeding, blood in stool, constipation, diarrhea, nausea, rectal pain and vomiting.   Genitourinary:  Negative for difficulty urinating, dysuria, frequency and hematuria.   Musculoskeletal:  Negative for arthralgias, back pain, gait problem, joint swelling, myalgias and neck pain.   Skin:  Negative for pallor, rash and wound.   Neurological:  Negative for dizziness, seizures, syncope, speech difficulty, weakness, light-headedness, numbness and headaches.   Hematological:  Negative for adenopathy. Does not bruise/bleed easily.   Psychiatric/Behavioral:  Negative for dysphoric mood and sleep disturbance. The patient is not nervous/anxious.          Objective:          aspirin EC 81 mg tablet Take one tablet by mouth daily after lunch. Take with food.   As of 08/08/21: ON HOLD due to all procedures/appointments. Last dose > 1 month ago. Pt to check in with Dr. Ladona Ridgel about resumption and when/if appropriate to start again.    azaTHIOprine (IMURAN) 50 mg tablet Take one tablet by mouth at bedtime daily.    calcium citrate/vitamin D3 (CALCIUM CITRATE + D PO) Take 1 tablet by mouth twice daily.    CONSTULOSE 10 gram/15 mL oral solution TAKE 30 ML BY MOUTH TWO TO THREE TIMES DAILY (TITRATES TO 2 - 3 BOWEL MOVEMENTS DAILY)    furosemide (LASIX) 20 mg tablet Take one tablet by mouth every morning.    omeprazole DR (PRILOSEC) 20 mg capsule Take one capsule by mouth daily before breakfast.    ondansetron HCL (ZOFRAN) 4 mg tablet Take one tablet by mouth every 6 hours as needed for Nausea or Vomiting.    ondansetron HCL (ZOFRAN) 8 mg  tablet Take one tablet by mouth every 8 hours as needed (nausea and vomiting).    oxyCODONE (ROXICODONE) 5 mg tablet Take one tablet by mouth every 6 hours as needed for Pain.    oxyCODONE-acetaminophen (PERCOCET) 7.5-325 mg tablet Take one tablet by mouth every 12 hours as needed for Pain.    spironolactone (ALDACTONE) 50 mg tablet Take one tablet by mouth daily with breakfast. Take with food.    XIFAXAN 550 mg tablet TAKE 1 TABLET BY MOUTH EVERY 12 HOURS     Vitals:    03/17/22 1326   BP: 124/88   BP Source: Arm, Left Upper   Pulse: 89   Temp: 36.2 ?C (97.1 ?F)   Resp: 18   SpO2: 97%   O2 Device: None (Room air)   TempSrc: Temporal   Weight: 78.5 kg (173 lb)     Body mass index is 27.92 kg/m?Marland Kitchen                  Pain Addressed:  N/A    Patient Evaluated for a Clinical Trial: No treatment clinical trial available for this patient.     [place] Cooperative Oncology Group performance status is 1, Restricted in physically strenuous activity but ambulatory and able to carry out work of a light or sedentary nature, e.g., light house work, office work.     Physical Exam  Constitutional:       General: She is not in acute distress.     Appearance: Normal appearance. She is well-developed.   HENT:      Head: Normocephalic and atraumatic.   Eyes:      General: No scleral icterus.        Right eye: No discharge.      Pupils: Pupils are equal, round, and reactive to light.   Neck:      Thyroid: No thyromegaly.   Cardiovascular:      Rate and Rhythm: Normal rate and regular rhythm.      Heart sounds: Normal heart sounds. No murmur heard.     No friction rub.   Pulmonary:      Effort: No respiratory distress.      Breath sounds: Normal breath sounds. No wheezing or rales.   Abdominal:      General: Bowel sounds are normal. There is no distension.      Palpations: Abdomen is soft. There is no hepatomegaly, splenomegaly or mass.      Tenderness: There is no abdominal tenderness. There is no guarding or rebound.      Hernia: No hernia is present.   Musculoskeletal:         General: No tenderness.      Cervical back: Full passive range of motion without pain, normal range of motion and neck supple.      Comments: Right lower limb amputation   Lymphadenopathy:      Head:      Right side of head: No submental, submandibular, tonsillar, preauricular, posterior auricular or occipital adenopathy.      Left side of head: No submental, submandibular, tonsillar, preauricular, posterior auricular or occipital adenopathy.      Cervical: No cervical adenopathy.      Upper Body:      Right upper body: No supraclavicular adenopathy.      Left upper body: No supraclavicular adenopathy.   Skin:     General: Skin is warm.      Findings: No petechiae or rash. Rash is not purpuric.  Neurological:      Mental Status: She is alert and oriented to person, place, and time.      Cranial Nerves: No cranial nerve deficit.      Sensory: No sensory deficit.      Coordination: Coordination normal.      Deep Tendon Reflexes: Reflexes are normal and symmetric.   Psychiatric:         Mood and Affect: Mood is not anxious or depressed.         Behavior: Behavior normal.         Thought Content: Thought content normal.         Judgment: Judgment normal.                Assessment and Plan:  Problem List          Oncology    Hepatocellular carcinoma [facility])    Overview          She was admitted to the hospital in 02/2017 with decompensated liver failure. Her MELD at that time was 27. She is currently still on prednisone taper with plans to start azathioprine soon           03/14/17 - AFP = 22.4 and 05/09/17 = 8.3     03/15/17 - MRI ABD impression Redemonstration of 1.7 cm LI RADS 3 observation in segment 5/6 of the liver with arterial enhancement. No appreciable washout or additional significant signal abnormality.  Heterogeneous perfusion pattern throughout the liver without additional discrete mass. Follow-up CT or MRI abdomen (liver protocol) in 3 months recommended. Cirrhosis with small portosystemic varices. No splenomegaly or ascites.       03/15/17 Liver biopsy  SURG PATH #: [id number]  A.  Chronic hepatitis with moderate to severe interface and lobular activity with lymphocytic and lympho- plasmacytic infiltrates in the portal tracts and extending into the lobule and areas of confluent hepatocyte necrosis/collapse, compatible with     autoimmune hepatitis.     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   See comment.      03/29/17 - Liver biopsy  SURG PATH #: [id number]  A. Liver, mass, biopsy:   Negative for mass/lesion.     4//8/19 Liver biopsy SURG PATH #: [id number] A. Liver mass, biopsy: Well differentiated hepatocellular carcinoma.          05/30/17 mri pelvis IMPRESSION  Three soft tissue nodules within the right pelvis that are unchanged since February 2019.  These most likely represent metastases from the reported sarcoma.  Consider biopsy of the largest nodule, as clinically indicated.       05/31/17 US Guided bx of the target right gluteal soft tissue nodule   Successful ultrasound guided biopsy of the target right gluteal soft tissue mass.  Four core biopsy specimens submitted to pathology in formalin     05/31/17 PATHOLOGY SURG PATH #: [id number] Diagnosis:  A. Nerve bundles, pelvic lymph node biopsy, core needle biopsy:   Abundant nerve bundles. See comment.        She is to also have MWA along with TACE for LI-Rads 5 lesion -   08/22/17 Successful CT -guided Microwave Ablation of hepatic segment 5/6 lesion  02/07/19 Successful CT -guided Microwave Ablation of segment 6 liver tumor.               Non-small cell lung cancer (HCC)  1. Hepatocellular carcinoma biopsy proven.   - Status post her second chemoembolization tolerated that very well.    - We reviewed her labs and tumor marker which are unremarkable. AFP is 2.1.  - Reviewed her CT AP which is negative, except for stable cirrhosis, no ascites.   - Had EGD on 05/23/2019 which showed esophageal varices, small and non-bleeding, so no intervention needed.    Devorah is currently on surveillance.  No evidence of recurrence we discussed the results of her scans today showing no evidence of viable tumor I reviewed the images with her and her daughter.  For now recommended continued surveillance we will see her back in 3 months with CT of the chest and pelvis with contrast abdomen with and without contrast.    Plan:  -Follow-up in 3 months with   CT of the abdomen with and without contrast CBC CMP alpha-fetoprotein INR.      2. Abnormal lung nodule of the left upper lobe suspicious for primary neoplasm of the lung .  Squamous cell carcinoma of the lung following with thoracic oncology Dr. 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  With recurrence of her right left rib status post SBRT looking stable.        3. 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     4. Autoimmune hepatitis   - Following up with Dr. Ladona Ridgel well compensated.

## 2022-03-31 ENCOUNTER — Encounter: Admit: 2022-03-31 | Discharge: 2022-03-31 | Payer: MEDICARE

## 2022-03-31 DIAGNOSIS — K7581 Nonalcoholic steatohepatitis (NASH): Secondary | ICD-10-CM

## 2022-03-31 DIAGNOSIS — K219 Gastro-esophageal reflux disease without esophagitis: Secondary | ICD-10-CM

## 2022-03-31 MED ORDER — OMEPRAZOLE 20 MG PO CPDR
20 mg | ORAL_CAPSULE | Freq: Every day | ORAL | 3 refills | Status: AC
Start: 2022-03-31 — End: ?

## 2022-04-06 ENCOUNTER — Encounter: Admit: 2022-04-06 | Discharge: 2022-04-06 | Payer: MEDICARE

## 2022-04-21 ENCOUNTER — Encounter: Admit: 2022-04-21 | Discharge: 2022-04-21 | Payer: MEDICARE

## 2022-04-21 ENCOUNTER — Ambulatory Visit: Admit: 2022-04-21 | Discharge: 2022-04-22 | Payer: MEDICARE

## 2022-04-21 DIAGNOSIS — C4402 Squamous cell carcinoma of skin of lip: Secondary | ICD-10-CM

## 2022-04-21 DIAGNOSIS — K219 Gastro-esophageal reflux disease without esophagitis: Secondary | ICD-10-CM

## 2022-04-21 DIAGNOSIS — C801 Malignant (primary) neoplasm, unspecified: Secondary | ICD-10-CM

## 2022-04-21 DIAGNOSIS — I8511 Secondary esophageal varices with bleeding: Secondary | ICD-10-CM

## 2022-04-21 DIAGNOSIS — D899 Disorder involving the immune mechanism, unspecified: Secondary | ICD-10-CM

## 2022-04-21 DIAGNOSIS — K7581 Nonalcoholic steatohepatitis (NASH): Secondary | ICD-10-CM

## 2022-04-21 DIAGNOSIS — C3492 Malignant neoplasm of unspecified part of left bronchus or lung: Secondary | ICD-10-CM

## 2022-04-21 DIAGNOSIS — R911 Solitary pulmonary nodule: Secondary | ICD-10-CM

## 2022-04-21 DIAGNOSIS — K754 Autoimmune hepatitis: Secondary | ICD-10-CM

## 2022-04-21 DIAGNOSIS — K7682 Hepatic encephalopathy (HCC): Secondary | ICD-10-CM

## 2022-04-21 DIAGNOSIS — C499 Malignant neoplasm of connective and soft tissue, unspecified: Secondary | ICD-10-CM

## 2022-04-21 DIAGNOSIS — K759 Inflammatory liver disease, unspecified: Secondary | ICD-10-CM

## 2022-04-21 DIAGNOSIS — J439 Emphysema, unspecified: Secondary | ICD-10-CM

## 2022-04-21 DIAGNOSIS — C220 Liver cell carcinoma: Secondary | ICD-10-CM

## 2022-04-21 DIAGNOSIS — K5790 Diverticulosis of intestine, part unspecified, without perforation or abscess without bleeding: Secondary | ICD-10-CM

## 2022-04-21 DIAGNOSIS — R188 Other ascites: Secondary | ICD-10-CM

## 2022-04-21 NOTE — Progress Notes
Telehealth office visit     Date of Service: 04/21/2022     Sarah Pineda is a 72 y.o. female    Subjective:       History of Present Illness  Sarah Pineda is extremely pleasant 72 y.o. female with biopsy-proven autoimmune hepatitis with decompensated cirrhosis complicated by [facility]  She has had features of mild portal hypertension.  She had been noted to have a liver lesion measuring 1.7 cm with biopsy proven hepatocellular carcinoma in April 2019, initially treated with microwave ablation and TACE in June 2019 with follow up treatment on August 22, 2017.  Manifestations of decompensation include early encephalopathy, trace ascites on imaging as well as had small esophageal varices noted in the past.  Overall low MELD scores most recently 7.  She has additional history of a lung nodule resected on July 24, 2017, with stage 1A non small-cell squamous cell carcinoma of the lung.  In July 2023 patient underwent microwave ablation and liver biopsy again showing hepatocellular carcinoma.  In July 2023 she also underwent a left lung biopsy showing squamous cell carcinoma and was treated with SBRT 09/28/2021 through 10/10/2021.  Repeat imaging with abdominal CT August 2023 showed segment 5/6 LI-RADS treated nonviable and LI-RADS 3 liver lesions.  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  She has had pelvic lymphadenopathy, which was biopsied, felt to be due to abundant nerve bundles from previous amputation.  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  She presents today in follow up with her daughter, most recently seen October 2023 by myself.    Interim update:  Overall the patient has been well and she denies hospitalizations or ED visits.  She remains on azathioprine and denies side effects or missed doses.  She had normal liver enzymes most recently February 2024.  MELD score was 8 from labs February 2024.  She does take oxycodone or acetaminophen as needed for pain.  She reports taking Constulose once daily with 1-3 bowel movements per day in addition to Xifaxan twice daily.  Patient and her daughter report no symptomatic hepatic encephalopathy.  She is up-to-date on annual exam by dermatology and mammogram.  Most recent imaging February 2024 showed no recurrence of HCC, stable rib lesion, normal AFP at 7.2 with recommendation to repeat imaging and AFP May 2024.  She reports resolution of minimal bilateral lower extremity edema.  Patient denies abdominal pain, nausea, hematemesis, diarrhea, constipation, melena, hematochezia, fatigue, fevers, jaundice, pruritus, chest pain, shortness of breath, or confusion. Patient denies tobacco, nicotine, alcohol, or substance use including MJ. No other changes or new symptoms.      Review of Systems  Review of systems as above and per History of Present Illness; otherwise negative for 10 of 14 systems reviewed.    Objective:          aspirin EC 81 mg tablet Take one tablet by mouth daily after lunch. Take with food.   As of 08/08/21: ON HOLD due to all procedures/appointments. Last dose > 1 month ago. Pt to check in with Dr. Ladona Ridgel about resumption and when/if appropriate to start again.    azaTHIOprine (IMURAN) 50 mg tablet Take one tablet by mouth at bedtime daily.    calcium citrate/vitamin D3 (CALCIUM CITRATE + D PO) Take 1 tablet by mouth twice daily.    CONSTULOSE 10 gram/15 mL oral solution TAKE 30 ML BY MOUTH TWO TO THREE TIMES DAILY (  TITRATES TO 2 - 3 BOWEL MOVEMENTS DAILY)    furosemide (LASIX) 20 mg tablet Take one tablet by mouth every morning.    omeprazole DR (PRILOSEC) 20 mg capsule Take one capsule by mouth daily before breakfast.    ondansetron HCL (ZOFRAN) 4 mg tablet Take one tablet by mouth every 6 hours as needed for Nausea or Vomiting.    ondansetron HCL (ZOFRAN) 8 mg tablet Take one tablet by mouth every 8 hours as needed (nausea and vomiting).    oxyCODONE (ROXICODONE) 5 mg tablet Take one tablet by mouth every 6 hours as needed for Pain.    oxyCODONE-acetaminophen (PERCOCET) 7.5-325 mg tablet Take one tablet by mouth every 12 hours as needed for Pain.    spironolactone (ALDACTONE) 50 mg tablet Take one tablet by mouth daily with breakfast. Take with food.    XIFAXAN 550 mg tablet TAKE 1 TABLET BY MOUTH EVERY 12 HOURS          Telehealth Patient Reported Vitals       Row Name 04/21/22 0827                Pain Score Zero                     Computed Telehealth Body Mass Index unavailable. One or more values for this score either were not found within the given timeframe or did not fit some other criterion.    Physical Exam  Constitutional:       Appearance: Well-developed. No muscle wasting present  HENT:      Head: Normocephalic and atraumatic.   Pulmonary:      Effort: Pulmonary effort is normal. No respiratory distress.   Skin:     General: No jaundice  Neurological:      Mental Status: Alert and oriented to person, place, and time.   Psychiatric:         Behavior: Behavior normal.         Thought Content: Thought content normal.     Labs / Imaging / Endo / Proc  Pertinent labs/imaging were reviewed on initiation of note and below:    Lab Results   Component Value Date    TOTPROT 7.1 03/17/2022    ALBUMIN 4.1 03/17/2022    TOTBILI 0.5 03/17/2022    ALKPHOS 92 03/17/2022    AST 25 03/17/2022    ALT 14 03/17/2022      Lab Results   Component Value Date    NA 137 03/17/2022    K 4.3 03/17/2022    BUN 12 03/17/2022    CR 0.96 03/17/2022      Lab Results   Component Value Date    WBC 4.9 03/17/2022    HGB 12.0 03/17/2022    PLTCT 185 03/17/2022    INR 1.1 03/17/2022        MELD 3.0: 8 at 03/17/2022 11:24 AM  MELD-Na: 7 at 03/17/2022 11:24 AM  Calculated from:  Serum Creatinine: 0.96 MG/DL (Using min of 1 MG/DL) at 1/61/0960 45:40 AM  Serum Sodium: 137 MMOL/L at 03/17/2022 11:24 AM  Total Bilirubin: 0.5 MG/DL (Using min of 1 MG/DL) at 9/81/1914 78:29 AM  Serum Albumin: 4.1 G/DL (Using max of 3.5 G/DL) at 5/62/1308 65:78 AM  INR(ratio): 1.1 at 03/17/2022 11:24 AM  Age at listing (hypothetical): 63 years  Sex: Female at 03/17/2022 11:24 AM     Alpha Feto Protein   Date Value Ref Range Status  03/17/2022 7.2 0.0 - 15.0 NG/ML Final               Assessment and Plan:  Impression:  Skylin Tangney is a pleasant female with complex past medical history, including underlying decompensated cirrhosis due to autoimmune hepatitis with trace ascites, hepatic encephalopathy, esophageal varices with MELD score of 8, with course complicated by development of hepatocellular carcinoma, non-small cell lung cancer with concerns for recurrence versus de novo lesion as well as background history of sarcoma with previous right lower extremity amputation, now presenting in follow up.     Assessment and Plan:  Decompensated cirrhosis due to history of autoimmune hepatitis.  The patient has had evidence of decompensated symptoms of mild hepatic encephalopathy, trace ascites on imaging, and small varices on previous EGD.  Most recent MELD score 8 from labs February 2024.  She has had development of hepatocellular carcinoma with local recurrence with last treatment July 2023.  She would appear to have barriers to transplant with underlying pulmonary malignancy as well as other malignancies noted in the past.  She will remain on low-dose azathioprine to maintain biochemical remission with risk of disease flare-up with medication cessation.  Continue to monitor labs every 3 months minimum.  Autoimmune hepatitis.  The patient has had biopsy-proven autoimmune hepatitis with positive ANA and IgG level elevated in the past.  ASMA has been negative.  She is maintained on chronic azathioprine therapy with normal liver tests.  At this time it appears that benefit of low-dose azathioprine would outweigh the risk of disease flare and risk of worsening decompensation, which can have significant consequences for the patient, especially with barriers to liver transplantation.  For now, we will continue azathioprine at current dose and continue to monitor labs every three months.  Immunosuppression management.  The patient remains on low-dose azathioprine 50 mg daily.  She overall appears to be tolerating medication at this time with normal liver enzymes.  Overall the continuation of medication would appear to outweigh the risks.  We will continue her current dosing.  Long-term risk of immunosuppression, including risk of malignancy has been discussed and reviewed.  Continue with the lab monitoring every three months.  Patient is up-to-date on exam with dermatology.  History of squamous cell carcinoma of the face.  Hepatocellular carcinoma.  The patient has had biopsy-proven hepatocellular carcinoma initially diagnosed in April 2019 and then treated with microwave ablation and TACE in June 2019, with follow up treatment later in July 2019.  She then had recurrence in January 2021, status post additional microwave ablation as well as most recently requiring treatment with microwave ablation July 2023.  AFP has remained normal most recently 7.2 from labs February 2024.  Follow-up imaging with abdominal CT February 2024 showed no recurrence of HCC and stable rib lesion with LI-RADS 3 lesions.  She is scheduled for follow-up imaging and lab testing with office visit with [name] oncology May 2024 and appreciate his assistance in her care.   Non-small cell lung cancer.  She has had initial resection of stage 1 non-small cell lung cancer in June 2019.  She has had concerns for recurrent disease seen on CT and PET scan with EBUS.  She has been followed in Medical Oncology and Radiation Oncology Clinic.  She has had previous SBRT therapy.  She has elected not to proceed with adjuvant chemotherapy, immunotherapy.  With imaging June 2023 with increasing size of pulmonary nodule concerning for metastasis patient underwent a biopsy showing squamous cell carcinoma.  She then underwent treatment with SBRT September 2023 with stable red lesion on follow-up pulmonary imaging.  She will need to continue to follow with the Radiation Oncology team for additional recommendations.  Follow-up CT chest scheduled May 2024.  Hepatic encephalopathy.  She reports no symptoms currently.  She remains on lactulose and rifaximin.  Recommend titrate lactulose to two to four bowel movements per day.  Recommend the patient not drive or operate machinery if symptoms are present.  Volume management.  She has had concerns for trace ascites on prior imaging and minimal intermittent lower extremity edema.  Most recent abdominal CT February 2024 without ascites.  She is on low-dose furosemide and spironolactone and should continue to follow 2000 mg sodium restricted diet.  Esophageal varices.  EGD November 2023 showed small esophageal varices, recommend repeat in 1 year for surveillance.  Colon cancer screening.  Last colonoscopy was May 18, 2017, which had shown good bowel prep with no polyps.  She was recommended a 10-year follow up, due in 2029.  History of H. pylori.  She has had previous H. pylori present on gastric biopsy and completed triple therapy.  We would recommend future H. pylori stool antigen testing to document eradication.  Osteoporosis.  She has a history of osteoporosis with last bone densitometry in May 2022.  She has followed with Endocrinology for treatment including Prolia, calcium, vitamin D.  Recommend to periodically monitor vitamin D level.   History of squamous cell cancer of the skin.  The patient has had history of previous skin cancer.  She is on chronic immunosuppression, increased risk for skin malignancy.  Recommend she continue to follow with her Dermatology team for ongoing routine monitoring.   LA grade a esophagitis.  Noted on EGD November 2023.  Continue PPI.  Vaccination status.  She appears to be immune to hepatitis A.  She has completed hepatitis B vaccination, unfortunately did not show evidence of seroconversion.  Reviewed she can repeat vaccination for hepatitis B with Heplisav-b and check for immunity upon completion.  Recommend annual influenza vaccination in addition to all other age-appropriate vaccines including COVID-19 vaccination series with updated booster.   Follow-up.  Patient to return for follow-up in 6 months with Coralee North APRN and next scheduled office visit March 2025 with Dr. Earle Gell or sooner as needed.  We have asked the patient to call or contact us in the interim if any further changes or updates to her clinical condition.    Plan:  Labs:   -MELD labs every 3 months     Imaging:   -CT chest/abdomen/pelvis followed by office visit with [name] oncology scheduled May 2024, continue to monitor AFP every 3 months     Procedures/Biopsy:   -Due for EGD November 2024 for surveillance of esophageal varices and LA grade a esophagitis     Other:   -Continue low-dose azathioprine for treatment of underlying autoimmune hepatitis as benefits outweigh risks at this time    45 minutes spent on this patient's encounter with counseling and coordination of care taking >50% of the visit.    Thank you very much for the opportunity to participate in the care of this patient.  Please do not hesitate to call or contact me at [phone] if I may be of further assistance in this patient's care. This note was created using Dragon, a Chemical engineer.    _____________________________  Coralee North APRN   [facility]  Hepatology and Liver Transplant  Office Number: [phone]

## 2022-05-01 ENCOUNTER — Encounter: Admit: 2022-05-01 | Discharge: 2022-05-01 | Payer: MEDICARE

## 2022-05-01 DIAGNOSIS — C3492 Malignant neoplasm of unspecified part of left bronchus or lung: Secondary | ICD-10-CM

## 2022-05-11 ENCOUNTER — Encounter: Admit: 2022-05-11 | Discharge: 2022-05-11 | Payer: MEDICARE

## 2022-05-11 ENCOUNTER — Ambulatory Visit: Admit: 2022-05-11 | Discharge: 2022-05-12 | Payer: MEDICARE

## 2022-05-11 DIAGNOSIS — Z8589 Personal history of malignant neoplasm of other organs and systems: Secondary | ICD-10-CM

## 2022-05-11 DIAGNOSIS — J439 Emphysema, unspecified: Secondary | ICD-10-CM

## 2022-05-11 DIAGNOSIS — D229 Melanocytic nevi, unspecified: Secondary | ICD-10-CM

## 2022-05-11 DIAGNOSIS — C4402 Squamous cell carcinoma of skin of lip: Secondary | ICD-10-CM

## 2022-05-11 DIAGNOSIS — D1801 Hemangioma of skin and subcutaneous tissue: Secondary | ICD-10-CM

## 2022-05-11 DIAGNOSIS — C220 Liver cell carcinoma: Secondary | ICD-10-CM

## 2022-05-11 DIAGNOSIS — C499 Malignant neoplasm of connective and soft tissue, unspecified: Secondary | ICD-10-CM

## 2022-05-11 DIAGNOSIS — K759 Inflammatory liver disease, unspecified: Secondary | ICD-10-CM

## 2022-05-11 DIAGNOSIS — L570 Actinic keratosis: Secondary | ICD-10-CM

## 2022-05-11 DIAGNOSIS — K219 Gastro-esophageal reflux disease without esophagitis: Secondary | ICD-10-CM

## 2022-05-11 DIAGNOSIS — D849 Immunodeficiency, unspecified: Secondary | ICD-10-CM

## 2022-05-11 DIAGNOSIS — K5790 Diverticulosis of intestine, part unspecified, without perforation or abscess without bleeding: Secondary | ICD-10-CM

## 2022-05-11 DIAGNOSIS — D899 Disorder involving the immune mechanism, unspecified: Secondary | ICD-10-CM

## 2022-05-11 DIAGNOSIS — R911 Solitary pulmonary nodule: Secondary | ICD-10-CM

## 2022-05-11 DIAGNOSIS — C801 Malignant (primary) neoplasm, unspecified: Secondary | ICD-10-CM

## 2022-05-11 DIAGNOSIS — K7581 Nonalcoholic steatohepatitis (NASH): Secondary | ICD-10-CM

## 2022-05-11 DIAGNOSIS — L821 Other seborrheic keratosis: Secondary | ICD-10-CM

## 2022-05-11 NOTE — Patient Instructions
You may reach my nurse, Brailon Don, by MyChart message or his direct line at [phone] with any questions or concerns.

## 2022-05-11 NOTE — Progress Notes
Date of Service: 05/11/2022    Subjective:             Sarah Pineda is a 72 y.o. female.    History of Present Illness  History reviewed from 010/2023 visitation with Dr. Demetrios Isaacs and is unchanged unless otherwise noted.     # Multiple melanocytic nevi  - Patient has a history of brown and tan spots distributed over the head, trunk, arms and legs.    - These have been present for many years.    - These get darker with sun exposure.    - There is no history of blistering sunburns.     # Hx of squamous cell cancer x2  - upper cutaneous lip removed by Dr. 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   Gastrointestinal:  Negative for diarrhea, nausea and vomiting.   Skin:  Negative for color change, pallor, rash and wound.         Objective:          aspirin EC 81 mg tablet Take one tablet by mouth daily after lunch. Take with food.   As of 08/08/21: ON HOLD due to all procedures/appointments. Last dose > 1 month ago. Pt to check in with Dr. Ladona Ridgel about resumption and when/if appropriate to start again.    azaTHIOprine (IMURAN) 50 mg tablet Take one tablet by mouth at bedtime daily.    calcium citrate/vitamin D3 (CALCIUM CITRATE + D PO) Take 1 tablet by mouth twice daily.    CONSTULOSE 10 gram/15 mL oral solution TAKE 30 ML BY MOUTH TWO TO THREE TIMES DAILY (TITRATES TO 2 - 3 BOWEL MOVEMENTS DAILY)    furosemide (LASIX) 20 mg tablet Take one tablet by mouth every morning.    omeprazole DR (PRILOSEC) 20 mg capsule Take one capsule by mouth daily before breakfast.    ondansetron HCL (ZOFRAN) 4 mg tablet Take one tablet by mouth every 6 hours as needed for Nausea or Vomiting.    ondansetron HCL (ZOFRAN) 8 mg tablet Take one tablet by mouth every 8 hours as needed (nausea and vomiting).    oxyCODONE (ROXICODONE) 5 mg tablet Take one tablet by mouth every 6 hours as needed for Pain.    oxyCODONE-acetaminophen (PERCOCET) 7.5-325 mg tablet Take one tablet by mouth every 12 hours as needed for Pain.    spironolactone (ALDACTONE) 50 mg tablet Take one tablet by mouth daily with breakfast. Take with food.    XIFAXAN 550 mg tablet TAKE 1 TABLET BY MOUTH EVERY 12 HOURS     Vitals:    05/11/22 1241   PainSc: Zero   Weight: 78 kg (172 lb)   Height: 167.6 cm (5' 6)       Body mass index is 27.76 kg/m?Marland Kitchen     Physical Exam  Areas Examined (all normal unless noted below):  Head/Face  Neck  Chest/axillae  Back  Abdomen  Buttocks  R upper ext  L upper ext  L lower ext    Pt declines examination of groin/genitals, breasts today.     Pertinent findings include:  General: Alert and Oriented x 3, Well-nourished  Eyes:  Normal Conjunctivae, EOMI  Psych: normal mood    Right above the knee amputation noted     Multiple brown and tan evenly pigmented macules are distributed over the examined areas.  All have symmetric similar dermascopic findings with primarily globular and reticular patterns.    Soft, pigmented, stuck-on-appearing papules are distributed over the examined areas.  All have symmetric pebbled dermoscopic findings.     Bright red, round to oval, dome-shaped papules distributed over the examined areas.     Scar at site of prior squamous cell carcinoma is noted with no evidence of recurrence.       On the left cheek x 1 are erythematous macules with gritty scale        Assessment and Plan:  #History of SCC  - No evidence of recurrence.  - Unremarkable scar at site of prior squamous cell carcinoma     # Actinic Keratosis   - Liquid nitrogen for 2 freeze-to-thaw cycles applied as below  - We discussed that these are a marker of sun damage and if remained untreated, may eventually become skin cancer.  - Advised patient that these are associated with history of UV exposure. Counseled on photoprotection and sun protective behavior, signs and symptoms of NMSC, ABCDEs of melanoma. Encouraged to call/contact us with any new/changing lesions/concerns.     DESTRUCTION OF PREMALIGNANT LESIONS, ACTINIC KERATOSIS  Verbal consent obtained.  DIAGNOSIS: Actinic Keratosis.  TYPE OF DESTRUCTION:  Cryotherapy  LOCATION OF LESIONS: see above  NUMBER OF LESIONS TREATED: 1  Wound care instructions provided. Patient tolerated the procedure well.    #Immunosuppressed Status 2/2 autoimmune hepatitis on imuran   - Reviewed increased risk of developing skin cancers  - Continue routine skin checks    # Seborrheic Keratoses  - reassurance, explained benign nature     #Cherry Angiomas  - reassurance of benign nature    #Melanocytic Nevi w/ +Hx Blistering Sunburns  - Reassured examined lesions appear normal  - counseled lesions that change in size, shape, color or are tender warrant further evaluation  - counseled on zinc-based sunscreen SPF 30 or greater  - counseled on wearing wide-brimmed hats and sun protective clothing  - recommend vitamin D3 1000 IU/day  - RTC for new/changing lesions    RTC 12 months                           In the presence of Argentina Ponder, MD,  I have taken down these notes, Sherrian Divers, Scribe. 05/11/2022 12:45 PM

## 2022-05-27 ENCOUNTER — Encounter: Admit: 2022-05-27 | Discharge: 2022-05-27 | Payer: MEDICARE

## 2022-05-27 MED ORDER — XIFAXAN 550 MG PO TAB
ORAL_TABLET | 0 refills
Start: 2022-05-27 — End: ?

## 2022-06-23 ENCOUNTER — Encounter: Admit: 2022-06-23 | Discharge: 2022-06-23 | Payer: MEDICARE

## 2022-06-23 DIAGNOSIS — K7581 Nonalcoholic steatohepatitis (NASH): Secondary | ICD-10-CM

## 2022-06-23 DIAGNOSIS — C220 Liver cell carcinoma: Secondary | ICD-10-CM

## 2022-06-23 DIAGNOSIS — D899 Disorder involving the immune mechanism, unspecified: Secondary | ICD-10-CM

## 2022-06-23 DIAGNOSIS — R911 Solitary pulmonary nodule: Secondary | ICD-10-CM

## 2022-06-23 DIAGNOSIS — C3490 Malignant neoplasm of unspecified part of unspecified bronchus or lung: Secondary | ICD-10-CM

## 2022-06-23 DIAGNOSIS — K219 Gastro-esophageal reflux disease without esophagitis: Secondary | ICD-10-CM

## 2022-06-23 DIAGNOSIS — C499 Malignant neoplasm of connective and soft tissue, unspecified: Secondary | ICD-10-CM

## 2022-06-23 DIAGNOSIS — J439 Emphysema, unspecified: Secondary | ICD-10-CM

## 2022-06-23 DIAGNOSIS — K5790 Diverticulosis of intestine, part unspecified, without perforation or abscess without bleeding: Secondary | ICD-10-CM

## 2022-06-23 DIAGNOSIS — C4402 Squamous cell carcinoma of skin of lip: Secondary | ICD-10-CM

## 2022-06-23 DIAGNOSIS — C801 Malignant (primary) neoplasm, unspecified: Secondary | ICD-10-CM

## 2022-06-23 DIAGNOSIS — K759 Inflammatory liver disease, unspecified: Secondary | ICD-10-CM

## 2022-06-23 LAB — CBC AND DIFF
ABSOLUTE EOS COUNT: 0.2 K/UL (ref 0–0.45)
ABSOLUTE LYMPH COUNT: 0.3 K/UL — ABNORMAL LOW (ref 1.0–4.8)
ABSOLUTE MONO COUNT: 0.3 K/UL (ref 0–0.80)
ABSOLUTE NEUTROPHIL: 3.8 K/UL (ref 1.8–7.0)
BASOPHILS %: 1 % (ref 0–2)
EOSINOPHILS %: 3 % (ref 0–5)
HEMATOCRIT: 34 % — ABNORMAL LOW (ref 36–45)
LYMPHOCYTES %: 7 % — ABNORMAL LOW (ref 24–44)
MCHC: 34 g/dL (ref 32.0–36.0)
MONOCYTES %: 6 % (ref >60)
MPV: 9 FL (ref 7–11)
NEUTROPHILS %: 83 % — ABNORMAL HIGH (ref 41–77)
PLATELET COUNT: 187 K/UL (ref 150–400)
RBC COUNT: 3.7 M/UL — ABNORMAL LOW (ref 4.0–5.0)
RDW: 13 % (ref 11–15)
WBC COUNT: 4.6 K/UL (ref 4.5–11.0)

## 2022-06-23 LAB — PROTIME INR (PT)
INR: 1.1 pg (ref 0.9–1.2)
PROTIME: 12 s (ref 10.2–12.9)

## 2022-06-23 LAB — ALPHA FETO PROTEIN (AFP), NON-PREGNANT: AFP: 39 ng/mL — ABNORMAL HIGH (ref 0.0–15.0)

## 2022-06-23 LAB — COMPREHENSIVE METABOLIC PANEL
CHLORIDE: 100 MMOL/L (ref 98–110)
POTASSIUM: 4.4 MMOL/L (ref 3.5–5.1)
SODIUM: 134 MMOL/L — ABNORMAL LOW (ref 137–147)

## 2022-06-23 MED ORDER — SODIUM CHLORIDE 0.9 % IJ SOLN
50 mL | Freq: Once | INTRAVENOUS | 0 refills | Status: CP
Start: 2022-06-23 — End: ?

## 2022-06-23 MED ORDER — IOHEXOL 350 MG IODINE/ML IV SOLN
100 mL | Freq: Once | INTRAVENOUS | 0 refills | Status: CP
Start: 2022-06-23 — End: ?

## 2022-06-23 NOTE — Progress Notes
Name: Sarah Pineda          MRN: [id number]      DOB: [birth year]      AGE: 72 y.o.   DATE OF SERVICE: 06/23/2022    Subjective:             Reason for Visit:  Follow Up      Sarah Pineda is a 72 y.o. female.      Cancer Staging   Hepatocellular carcinoma (HCC)  Staging form: Liver, AJCC 8th Edition  - Clinical: cT1, cN0, cM0 - Signed by Dayton Bailiff, MD on 06/06/2017    Non-small cell lung cancer (HCC)  Staging form: Lung, AJCC 8th Edition  - Pathologic: Stage IA2 (pT1b, pN0, cM0) - Signed by Dayton Bailiff, MD on 08/03/2017  - Pathologic: No stage assigned - Unsigned        History of Present Illness     Sarah Pineda presents for a routine follow-up. presents for a routine follow-up. She reports feeling pretty good with no new or worsening symptoms. She denies any issues with breathing or bone pain. She also denies any nausea, vomiting, or significant weight changes. The patient's blood counts have slightly decreased, which she attributes to her fatty liver disease. The patient's alpha-fetoprotein, a liver cancer marker, has increased, but the significance of this is unclear as her cancer has not been active since 2019. She denies any use of marijuana or other substances that could potentially affect this marker.    .     Review of Systems   Constitutional:  Negative for fatigue, fever and unexpected weight change.   HENT:  Negative for facial swelling, mouth sores, sore throat, tinnitus, trouble swallowing and voice change.    Respiratory:  Negative for cough, choking, chest tightness, shortness of breath and wheezing.    Cardiovascular:  Negative for chest pain, palpitations and leg swelling.   Gastrointestinal:  Negative for abdominal distention, abdominal pain, anal bleeding, blood in stool, constipation, diarrhea, nausea, rectal pain and vomiting.   Genitourinary:  Negative for difficulty urinating, dysuria, frequency and hematuria.   Musculoskeletal:  Negative for arthralgias, back pain, gait problem, joint swelling, myalgias and neck pain.   Skin:  Negative for pallor, rash and wound.   Neurological:  Negative for dizziness, seizures, syncope, speech difficulty, weakness, light-headedness, numbness and headaches.   Hematological:  Negative for adenopathy. Does not bruise/bleed easily.   Psychiatric/Behavioral:  Negative for dysphoric mood and sleep disturbance. The patient is not nervous/anxious.          Objective:          aspirin EC 81 mg tablet Take one tablet by mouth daily after lunch. Take with food.   As of 08/08/21: ON HOLD due to all procedures/appointments. Last dose > 1 month ago. Pt to check in with Dr. Ladona Ridgel about resumption and when/if appropriate to start again.    azaTHIOprine (IMURAN) 50 mg tablet Take one tablet by mouth at bedtime daily.    calcium citrate/vitamin D3 (CALCIUM CITRATE + D PO) Take 1 tablet by mouth twice daily.    CONSTULOSE 10 gram/15 mL oral solution TAKE 30 ML BY MOUTH TWO TO THREE TIMES DAILY (TITRATES TO 2 - 3 BOWEL MOVEMENTS DAILY)    furosemide (LASIX) 20 mg tablet Take one tablet by mouth every morning.    omeprazole DR (PRILOSEC) 20 mg capsule Take one capsule by mouth daily before breakfast.    ondansetron HCL (ZOFRAN) 4 mg tablet Take one  tablet by mouth every 6 hours as needed for Nausea or Vomiting.    ondansetron HCL (ZOFRAN) 8 mg tablet Take one tablet by mouth every 8 hours as needed (nausea and vomiting).    oxyCODONE (ROXICODONE) 5 mg tablet Take one tablet by mouth every 6 hours as needed for Pain.    oxyCODONE-acetaminophen (PERCOCET) 7.5-325 mg tablet Take one tablet by mouth every 12 hours as needed for Pain.    rifAXIMin (XIFAXAN) 550 mg tablet Take one tablet by mouth every 12 hours.    spironolactone (ALDACTONE) 50 mg tablet Take one tablet by mouth daily with breakfast. Take with food.     Vitals:    06/23/22 1351   BP: 132/77   BP Source: Arm, Right Upper   Pulse: 92   Temp: 36.8 ?C (98.3 ?F)   Resp: 16   SpO2: 97%   TempSrc: Temporal   PainSc: Zero Weight: 78.4 kg (172 lb 12.8 oz)     Body mass index is 27.89 kg/m?Marland Kitchen     Pain Score: Zero       Fatigue Scale: 0-None    Pain Addressed:  N/A    Patient Evaluated for a Clinical Trial: No treatment clinical trial available for this patient.     Sarah Pineda Cooperative Oncology Group performance status is 1, Restricted in physically strenuous activity but ambulatory and able to carry out work of a light or sedentary nature, e.g., light house work, office work.     Physical Exam  Constitutional:       General: She is not in acute distress.     Appearance: Normal appearance. She is well-developed.   HENT:      Head: Normocephalic and atraumatic.   Eyes:      General: No scleral icterus.        Right eye: No discharge.      Pupils: Pupils are equal, round, and reactive to light.   Neck:      Thyroid: No thyromegaly.   Cardiovascular:      Rate and Rhythm: Normal rate and regular rhythm.      Heart sounds: Normal heart sounds. No murmur heard.     No friction rub.   Pulmonary:      Effort: No respiratory distress.      Breath sounds: Normal breath sounds. No wheezing or rales.   Abdominal:      General: Bowel sounds are normal. There is no distension.      Palpations: Abdomen is soft. There is no hepatomegaly, splenomegaly or mass.      Tenderness: There is no abdominal tenderness. There is no guarding or rebound.      Hernia: No hernia is present.   Musculoskeletal:         General: No tenderness.      Cervical back: Full passive range of motion without pain, normal range of motion and neck supple.      Comments: Right lower limb amputation   Lymphadenopathy:      Head:      Right side of head: No submental, submandibular, tonsillar, preauricular, posterior auricular or occipital adenopathy.      Left side of head: No submental, submandibular, tonsillar, preauricular, posterior auricular or occipital adenopathy.      Cervical: No cervical adenopathy.      Upper Body:      Right upper body: No supraclavicular adenopathy. Left upper body: No supraclavicular adenopathy.   Skin:     General: Skin is warm.  Findings: No petechiae or rash. Rash is not purpuric.   Neurological:      Mental Status: She is alert and oriented to person, place, and time.      Cranial Nerves: No cranial nerve deficit.      Sensory: No sensory deficit.      Coordination: Coordination normal.      Deep Tendon Reflexes: Reflexes are normal and symmetric.   Psychiatric:         Mood and Affect: Mood is not anxious or depressed.         Behavior: Behavior normal.         Thought Content: Thought content normal.         Judgment: Judgment normal.                Assessment and Plan:  Problem List          Oncology    Hepatocellular carcinoma [facility])    Overview          She was admitted to the hospital in 02/2017 with decompensated liver failure. Her MELD at that time was 27. She is currently still on prednisone taper with plans to start azathioprine soon           03/14/17 - AFP = 22.4 and 05/09/17 = 8.3     03/15/17 - MRI ABD impression Redemonstration of 1.7 cm LI RADS 3 observation in segment 5/6 of the liver with arterial enhancement. No appreciable washout or additional significant signal abnormality.  Heterogeneous perfusion pattern throughout the liver without additional discrete mass. Follow-up CT or MRI abdomen (liver protocol) in 3 months recommended. Cirrhosis with small portosystemic varices. No splenomegaly or ascites.       03/15/17 Liver biopsy  SURG PATH #: [id number]  A.  Chronic hepatitis with moderate to severe interface and lobular activity with lymphocytic and lympho- plasmacytic infiltrates in the portal tracts and extending into the lobule and areas of confluent hepatocyte necrosis/collapse, compatible with     autoimmune hepatitis.     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   See comment.      03/29/17 - Liver biopsy  SURG PATH #: [id number]  A. Liver, mass, biopsy:   Negative for mass/lesion.     4//8/19 Liver biopsy SURG PATH #: [id number] A. Liver mass, biopsy: Well differentiated hepatocellular carcinoma.          05/30/17 mri pelvis IMPRESSION  Three soft tissue nodules within the right pelvis that are unchanged since February 2019.  These most likely represent metastases from the reported sarcoma.  Consider biopsy of the largest nodule, as clinically indicated.       05/31/17 US Guided bx of the target right gluteal soft tissue nodule   Successful ultrasound guided biopsy of the target right gluteal soft tissue mass.  Four core biopsy specimens submitted to pathology in formalin     05/31/17 PATHOLOGY SURG PATH #: [id number] Diagnosis:  A. Nerve bundles, pelvic lymph node biopsy, core needle biopsy:   Abundant nerve bundles. See comment.        She is to also have MWA along with TACE for LI-Rads 5 lesion -   08/22/17 Successful CT -guided Microwave Ablation of hepatic segment 5/6 lesion  02/07/19 Successful CT -guided Microwave Ablation of segment 6 liver tumor.  1. Hepatocellular carcinoma biopsy proven.   - Status post her second chemoembolization tolerated that very well.    - We reviewed her labs and tumor marker which are unremarkable. AFP is 2.1.  - Reviewed her CT AP which is negative, except for stable cirrhosis, no ascites.   - Had EGD on 05/23/2019 which showed esophageal varices, small and non-bleeding, so no intervention needed.    Jhenesis is currently on surveillance.  No clinical evidence of progression her alpha-fetoprotein did spike we discussed the results of her scans today showing no evidence of viable tumor her lung disease has been adequately controlled with radiation as she is following with radiation oncology and thoracic oncology.  For the hepatocellular carcinoma I recommended follow-up in 3 months    Plan:  -Follow-up in 3 months with   CT of the abdomen with and without contrast CBC CMP alpha-fetoprotein INR.      2. Abnormal lung nodule of the left upper lobe suspicious for primary neoplasm of the lung .  Squamous cell carcinoma of the lung following with thoracic oncology Dr. 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  With recurrence of her right left rib status post SBRT looking stable.        3. 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     4. Autoimmune hepatitis   - Following up with Dr. Ladona Ridgel well compensated.

## 2022-06-27 ENCOUNTER — Encounter: Admit: 2022-06-27 | Discharge: 2022-06-27 | Payer: MEDICARE

## 2022-06-27 ENCOUNTER — Ambulatory Visit: Admit: 2022-06-27 | Discharge: 2022-06-27 | Payer: MEDICARE

## 2022-06-27 DIAGNOSIS — C3492 Malignant neoplasm of unspecified part of left bronchus or lung: Secondary | ICD-10-CM

## 2022-06-27 NOTE — Progress Notes
Telehealth Visit Note    Date of Service: 06/27/2022    Subjective:           Sarah Pineda is a 72 y.o. female.    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 She then presented with an 11 mm irregular pulmonary nodule in the left upper lobe on chest CT and significant increase FDG uptake with maximum SUV of 9.5 on PET scan.     She was treated with SBRT to 5000 cGy in 5 fractions, completed on 01/09/2020.      Interval history:  Currently on surveillance of HCC.  Recent abdominal scan demonstrated a 6 mm enhancement focus in segment 5/6 and CT chest showed increase in size of a couple left lung nodules that most likely represent metastases  Tumor board discussion suggested needle biopsy of lung nodule to rule out primary lung cancer versus metastatic from [facility].  Lung nodule biopsy showed recurrence of her squamous cell carcinoma of the lung. Liver biopsy showed hepatocellular carcinoma s/p microwave ablation completed on 08/11/2021. Pet scan scheduled today showed development of increased FDG uptake in the left apical nodule with SUV max of 12.1 and mild FDG uptake at prior wedge resection site with SUV max of 3.7. No hypermetabolic lesions in the liver.  The patient is overall doing well without major complaints.  She denies worsening shortness of breath, chest pain, fever or chills.     NM PET SCAN TORSO (SKULL-THIGHS)  Result Date: 08/30/2021   1. Mild diffuse FDG uptake at the prior wedge resection site, likely post therapeutic changes. 2. Increased FDG uptake in the left apical nodule, which could be synchronous primary lung cancer or metastatic disease.     She was treated with SBRT to left apical nodule, completed on 10/10/2021.     She is here for a follow up with CT scans. She is doing well and no complaints.                Objective:          aspirin EC 81 mg tablet Take one tablet by mouth daily after lunch. Take with food.   As of 08/08/21: ON HOLD due to all procedures/appointments. Last dose > 1 month ago. Pt to check in with Dr. Ladona Ridgel about resumption and when/if appropriate to start again.    azaTHIOprine (IMURAN) 50 mg tablet Take one tablet by mouth at bedtime daily.    calcium citrate/vitamin D3 (CALCIUM CITRATE + D PO) Take 1 tablet by mouth twice daily.    CONSTULOSE 10 gram/15 mL oral solution TAKE 30 ML BY MOUTH TWO TO THREE TIMES DAILY (TITRATES TO 2 - 3 BOWEL MOVEMENTS DAILY)    furosemide (LASIX) 20 mg tablet Take one tablet by mouth every morning.    omeprazole DR (PRILOSEC) 20 mg capsule Take one capsule by mouth daily before breakfast.    ondansetron HCL (ZOFRAN) 4 mg tablet Take one tablet by mouth every 6 hours as needed for Nausea or Vomiting.    ondansetron HCL (ZOFRAN) 8 mg tablet Take one tablet by mouth every 8 hours as needed (nausea and vomiting).    oxyCODONE (ROXICODONE) 5 mg tablet Take one tablet by mouth every 6 hours as needed for Pain.  oxyCODONE-acetaminophen (PERCOCET) 7.5-325 mg tablet Take one tablet by mouth every 12 hours as needed for Pain.    rifAXIMin (XIFAXAN) 550 mg tablet Take one tablet by mouth every 12 hours.    spironolactone (ALDACTONE) 50 mg tablet Take one tablet by mouth daily with breakfast. Take with food.           Computed Telehealth Body Mass Index unavailable. One or more values for this score either were not found within the given timeframe or did not fit some other criterion.    Physical Exam    CT 5/31 2024  CHEST:   1. No evidence of new metastatic disease within the thorax.   2. Prior left upper lobe wedge resection and left lung perihilar scarring.   3. Unchanged tiny areas of linear scarring in the left lung at the site of   treated metastases.   4. Unchanged chronic left 3rd rib fracture and adjacent thickening.     ABDOMEN AND PELVIS:   1. Previously treated hepatic segment 5 observation. LI-RADS treated   nonviable.   2. Numerous small areas of arterial phase hyperenhancement scattered   throughout both hepatic lobes. LI-RADS 2 and 3 observations. Continued   follow-up is recommended.   3. Few nodules within the right pelvis are unchanged since the oldest   available exam in February 2019. 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          Assessment and Plan:  CT showed no progression of her treated lung cancer.  One year follow up with CT.  She has follow up with GI onc, for HCC.                          20 minutes spent on this patient's encounter with counseling and coordination of care taking >50% of the visit.

## 2022-07-03 ENCOUNTER — Encounter: Admit: 2022-07-03 | Discharge: 2022-07-03 | Payer: MEDICARE

## 2022-07-03 DIAGNOSIS — C3492 Malignant neoplasm of unspecified part of left bronchus or lung: Secondary | ICD-10-CM

## 2022-07-03 NOTE — Progress Notes
Plan of care update for RTC Dr. Chipper Herb on 07/03/2022 :      - coordinate RTC based upon imaging ordered by [name]  - [facility] RTC Dr. Chipper Herb 10/02/2022

## 2022-09-14 ENCOUNTER — Encounter: Admit: 2022-09-14 | Discharge: 2022-09-14 | Payer: MEDICARE

## 2022-09-15 ENCOUNTER — Encounter: Admit: 2022-09-15 | Discharge: 2022-09-15 | Payer: MEDICARE

## 2022-09-15 MED ORDER — XIFAXAN 550 MG PO TAB
550 mg | ORAL_TABLET | Freq: Two times a day (BID) | ORAL | 11 refills | 30.00000 days | Status: AC
Start: 2022-09-15 — End: ?

## 2022-09-29 ENCOUNTER — Encounter: Admit: 2022-09-29 | Discharge: 2022-09-29 | Payer: MEDICARE

## 2022-09-29 DIAGNOSIS — C4402 Squamous cell carcinoma of skin of lip: Secondary | ICD-10-CM

## 2022-09-29 DIAGNOSIS — C220 Liver cell carcinoma: Secondary | ICD-10-CM

## 2022-09-29 DIAGNOSIS — D899 Disorder involving the immune mechanism, unspecified: Secondary | ICD-10-CM

## 2022-09-29 DIAGNOSIS — R911 Solitary pulmonary nodule: Secondary | ICD-10-CM

## 2022-09-29 DIAGNOSIS — C801 Malignant (primary) neoplasm, unspecified: Secondary | ICD-10-CM

## 2022-09-29 DIAGNOSIS — C499 Malignant neoplasm of connective and soft tissue, unspecified: Secondary | ICD-10-CM

## 2022-09-29 DIAGNOSIS — K219 Gastro-esophageal reflux disease without esophagitis: Secondary | ICD-10-CM

## 2022-09-29 DIAGNOSIS — K5790 Diverticulosis of intestine, part unspecified, without perforation or abscess without bleeding: Secondary | ICD-10-CM

## 2022-09-29 DIAGNOSIS — J439 Emphysema, unspecified: Secondary | ICD-10-CM

## 2022-09-29 DIAGNOSIS — K759 Inflammatory liver disease, unspecified: Secondary | ICD-10-CM

## 2022-09-29 DIAGNOSIS — C3490 Malignant neoplasm of unspecified part of unspecified bronchus or lung: Secondary | ICD-10-CM

## 2022-09-29 DIAGNOSIS — K7581 Nonalcoholic steatohepatitis (NASH): Secondary | ICD-10-CM

## 2022-09-29 LAB — COMPREHENSIVE METABOLIC PANEL
ALBUMIN: 4 g/dL — ABNORMAL HIGH (ref 3.5–5.0)
ALK PHOSPHATASE: 103 U/L — ABNORMAL LOW (ref 25–110)
ALT: 8 U/L (ref 7–56)
ANION GAP: 7 10*3/uL (ref 3–12)
AST: 18 U/L (ref 7–40)
BLD UREA NITROGEN: 18 mg/dL (ref 7–25)
CALCIUM: 9.8 mg/dL (ref 8.5–10.6)
CHLORIDE: 97 MMOL/L — ABNORMAL LOW (ref 98–110)
CO2: 31 MMOL/L — ABNORMAL HIGH (ref 21–30)
CREATININE: 0.9 mg/dL (ref 0.4–1.00)
EGFR: >60 mL/min — ABNORMAL LOW (ref >60)
GLUCOSE,PANEL: 90 mg/dL (ref 70–100)
POTASSIUM: 4 MMOL/L — ABNORMAL LOW (ref 3.5–5.1)
SODIUM: 135 MMOL/L — ABNORMAL LOW (ref 137–147)
TOTAL BILIRUBIN: 0.3 mg/dL (ref 0.2–1.3)
TOTAL PROTEIN: 8 g/dL (ref 6.0–8.0)

## 2022-09-29 LAB — PROTIME INR (PT): PROTIME: 12 s (ref 10.2–12.9)

## 2022-09-29 LAB — CBC AND DIFF
ABSOLUTE BASO COUNT: 0 10*3/uL (ref 0–0.20)
ABSOLUTE EOS COUNT: 0.1 10*3/uL (ref 0–0.45)
ABSOLUTE MONO COUNT: 0.3 10*3/uL (ref 0–0.80)
WBC COUNT: 5.3 10*3/uL (ref 4.5–11.0)

## 2022-09-29 LAB — ALPHA FETO PROTEIN (AFP), NON-PREGNANT: AFP: 100 ng/mL — ABNORMAL HIGH (ref 0.0–15.0)

## 2022-09-29 MED ORDER — IOHEXOL 350 MG IODINE/ML IV SOLN
100 mL | Freq: Once | INTRAVENOUS | 0 refills | Status: CP
Start: 2022-09-29 — End: ?

## 2022-09-29 MED ORDER — SODIUM CHLORIDE 0.9 % IJ SOLN
50 mL | Freq: Once | INTRAVENOUS | 0 refills | Status: CP
Start: 2022-09-29 — End: ?

## 2022-09-29 NOTE — Progress Notes
Name: Sarah Pineda          MRN: [id number]      DOB: 10-Sep-1950      AGE: 72 y.o.   DATE OF SERVICE: 09/29/2022    Subjective:             Reason for Visit:  Heme/Onc Care      Sarah Pineda is a 72 y.o. female.      Cancer Staging   Hepatocellular carcinoma (HCC)  Staging form: Liver, AJCC 8th Edition  - Clinical: cT1, cN0, cM0 - Signed by Dayton Bailiff, MD on 06/06/2017    Non-small cell lung cancer (HCC)  Staging form: Lung, AJCC 8th Edition  - Pathologic: Stage IA2 (pT1b, pN0, cM0) - Signed by Dayton Bailiff, MD on 08/03/2017  - Pathologic: No stage assigned - Unsigned        History of Present Illness     Sarah Pineda history of liver cancer, presents for a follow-up visit. She reports no new symptoms and overall good health. The patient's blood counts, liver function, and kidney function are reported as satisfactory, with no signs of bleeding or bowel issues.    The patient underwent a treatment for a liver lesion in segment five in July of the previous year.    The patient also reports issues with their stump, which has been causing her pain, particularly in humid weather. She has been managing this pain with medication prescribed by their primary care physician.    .     Review of Systems   Constitutional:  Negative for fatigue, fever and unexpected weight change.   HENT:  Negative for facial swelling, mouth sores, sore throat, tinnitus, trouble swallowing and voice change.    Respiratory:  Negative for cough, choking, chest tightness, shortness of breath and wheezing.    Cardiovascular:  Negative for chest pain, palpitations and leg swelling.   Gastrointestinal:  Negative for abdominal distention, abdominal pain, anal bleeding, blood in stool, constipation, diarrhea, nausea, rectal pain and vomiting.   Genitourinary:  Negative for difficulty urinating, dysuria, frequency and hematuria.   Musculoskeletal:  Negative for arthralgias, back pain, gait problem, joint swelling, myalgias and neck pain.   Skin: Negative for pallor, rash and wound.   Neurological:  Negative for dizziness, seizures, syncope, speech difficulty, weakness, light-headedness, numbness and headaches.   Hematological:  Negative for adenopathy. Does not bruise/bleed easily.   Psychiatric/Behavioral:  Negative for dysphoric mood and sleep disturbance. The patient is not nervous/anxious.          Objective:          aspirin EC 81 mg tablet Take one tablet by mouth daily after lunch. Take with food.   As of 08/08/21: ON HOLD due to all procedures/appointments. Last dose > 1 month ago. Pt to check in with Dr. Ladona Ridgel about resumption and when/if appropriate to start again.    azaTHIOprine (IMURAN) 50 mg tablet Take one tablet by mouth at bedtime daily.    calcium citrate/vitamin D3 (CALCIUM CITRATE + D PO) Take 1 tablet by mouth twice daily.    CONSTULOSE 10 gram/15 mL oral solution TAKE 30 ML BY MOUTH TWO TO THREE TIMES DAILY (TITRATES TO 2 - 3 BOWEL MOVEMENTS DAILY)    furosemide (LASIX) 20 mg tablet Take one tablet by mouth every morning.    omeprazole DR (PRILOSEC) 20 mg capsule Take one capsule by mouth daily before breakfast.    ondansetron HCL (ZOFRAN) 4 mg tablet Take one tablet  by mouth every 6 hours as needed for Nausea or Vomiting.    ondansetron HCL (ZOFRAN) 8 mg tablet Take one tablet by mouth every 8 hours as needed (nausea and vomiting).    oxyCODONE (ROXICODONE) 5 mg tablet Take one tablet by mouth every 6 hours as needed for Pain.    oxyCODONE-acetaminophen (PERCOCET) 7.5-325 mg tablet Take one tablet by mouth every 12 hours as needed for Pain.    rifAXIMin (XIFAXAN) 550 mg tablet Take one tablet by mouth every 12 hours.    spironolactone (ALDACTONE) 50 mg tablet Take one tablet by mouth daily with breakfast. Take with food.     Vitals:    09/29/22 1445   BP: 115/74   BP Source: Arm, Left Upper   Pulse: 82   Temp: 36.6 ?C (97.9 ?F)   Resp: 16   SpO2: 98%   TempSrc: Temporal   PainSc: Zero   Weight: 73.5 kg (162 lb)   Height: 167.6 cm (5' 5.98)     Body mass index is 26.16 kg/m?Marland Kitchen     Pain Score: Zero       Fatigue Scale: 1    Pain Addressed:  N/A    Patient Evaluated for a Clinical Trial: No treatment clinical trial available for this patient.     [place] Cooperative Oncology Group performance status is 1, Restricted in physically strenuous activity but ambulatory and able to carry out work of a light or sedentary nature, e.g., light house work, office work.     Physical Exam  Constitutional:       General: She is not in acute distress.     Appearance: Normal appearance. She is well-developed.   HENT:      Head: Normocephalic and atraumatic.   Eyes:      General: No scleral icterus.        Right eye: No discharge.      Pupils: Pupils are equal, round, and reactive to light.   Neck:      Thyroid: No thyromegaly.   Cardiovascular:      Rate and Rhythm: Normal rate and regular rhythm.      Heart sounds: Normal heart sounds. No murmur heard.     No friction rub.   Pulmonary:      Effort: No respiratory distress.      Breath sounds: Normal breath sounds. No wheezing or rales.   Abdominal:      General: Bowel sounds are normal. There is no distension.      Palpations: Abdomen is soft. There is no hepatomegaly, splenomegaly or mass.      Tenderness: There is no abdominal tenderness. There is no guarding or rebound.      Hernia: No hernia is present.   Musculoskeletal:         General: No tenderness.      Cervical back: Full passive range of motion without pain, normal range of motion and neck supple.      Comments: Right lower limb amputation   Lymphadenopathy:      Head:      Right side of head: No submental, submandibular, tonsillar, preauricular, posterior auricular or occipital adenopathy.      Left side of head: No submental, submandibular, tonsillar, preauricular, posterior auricular or occipital adenopathy.      Cervical: No cervical adenopathy.      Upper Body:      Right upper body: No supraclavicular adenopathy.      Left upper body: No supraclavicular adenopathy.  Skin:     General: Skin is warm.      Findings: No petechiae or rash. Rash is not purpuric.   Neurological:      Mental Status: She is alert and oriented to person, place, and time.      Cranial Nerves: No cranial nerve deficit.      Sensory: No sensory deficit.      Coordination: Coordination normal.      Deep Tendon Reflexes: Reflexes are normal and symmetric.   Psychiatric:         Mood and Affect: Mood is not anxious or depressed.         Behavior: Behavior normal.         Thought Content: Thought content normal.         Judgment: Judgment normal.                Assessment and Plan:  Problem List          Oncology    Hepatocellular carcinoma [facility])    Overview          She was admitted to the hospital in 02/2017 with decompensated liver failure. Her MELD at that time was 27. She is currently still on prednisone taper with plans to start azathioprine soon           03/14/17 - AFP = 22.4 and 05/09/17 = 8.3     03/15/17 - MRI ABD impression Redemonstration of 1.7 cm LI RADS 3 observation in segment 5/6 of the liver with arterial enhancement. No appreciable washout or additional significant signal abnormality.  Heterogeneous perfusion pattern throughout the liver without additional discrete mass. Follow-up CT or MRI abdomen (liver protocol) in 3 months recommended. Cirrhosis with small portosystemic varices. No splenomegaly or ascites.       03/15/17 Liver biopsy  SURG PATH #: [id number]  A.  Chronic hepatitis with moderate to severe interface and lobular activity with lymphocytic and lympho- plasmacytic infiltrates in the portal tracts and extending into the lobule and areas of confluent hepatocyte necrosis/collapse, compatible with     autoimmune hepatitis.     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   See comment.      03/29/17 - Liver biopsy  SURG PATH #: [id number]  A. Liver, mass, biopsy:   Negative for mass/lesion.     4//8/19 Liver biopsy SURG PATH #: [id number] A. Liver mass, biopsy: Well differentiated hepatocellular carcinoma.          05/30/17 mri pelvis IMPRESSION  Three soft tissue nodules within the right pelvis that are unchanged since February 2019.  These most likely represent metastases from the reported sarcoma.  Consider biopsy of the largest nodule, as clinically indicated.       05/31/17 US Guided bx of the target right gluteal soft tissue nodule   Successful ultrasound guided biopsy of the target right gluteal soft tissue mass.  Four core biopsy specimens submitted to pathology in formalin     05/31/17 PATHOLOGY SURG PATH #: [id number] Diagnosis:  A. Nerve bundles, pelvic lymph node biopsy, core needle biopsy:   Abundant nerve bundles. See comment.        She is to also have MWA along with TACE for LI-Rads 5 lesion -   08/22/17 Successful CT -guided Microwave Ablation of hepatic segment 5/6 lesion  02/07/19 Successful CT -guided Microwave Ablation of segment 6 liver tumor.  1. Hepatocellular carcinoma biopsy proven.   - Status post her second chemoembolization tolerated that very well.    - We reviewed her labs and tumor marker which are unremarkable. AFP is 2.1.  - Reviewed her CT AP which is negative, except for stable cirrhosis, no ascites.   - Had EGD on 05/23/2019 which showed esophageal varices, small and non-bleeding, so no intervention needed.    Sharissa is currently on surveillance.  We discussed results of her scans today unfortunately they show 2 areas of concern 1 is near segment 5 that was previously treated with microwave ablation and a new area in segment 1 with a rise in her alpha-fetoprotein to 100.  I would like to discuss her case with interventional radiology and plan for liver directed therapy she will need to see me in 2 months with CT of the abdomen with and without contrast to ensure that these areas are adequately treated with CBC CMP alpha-fetoprotein  Plan:  -Follow-up in 2 months with   CT of the abdomen with and without contrast CBC CMP alpha-fetoprotein INR.      2. Abnormal lung nodule of the left upper lobe suspicious for primary neoplasm of the lung .  Squamous cell carcinoma of the lung following with thoracic oncology Dr. 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  With recurrence of her right left rib status post SBRT looking stable.        3. 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     4. Autoimmune hepatitis   - Following up with Dr. Ladona Ridgel well compensated.

## 2022-10-02 ENCOUNTER — Encounter: Admit: 2022-10-02 | Discharge: 2022-10-02 | Payer: MEDICARE

## 2022-10-02 DIAGNOSIS — C220 Liver cell carcinoma: Secondary | ICD-10-CM

## 2022-10-02 DIAGNOSIS — C3492 Malignant neoplasm of unspecified part of left bronchus or lung: Secondary | ICD-10-CM

## 2022-10-02 NOTE — Progress Notes
The patient case was discussed  at our multidisciplinary tumor board the most recent imaging was compared to previous imaging a new area has appeared in segment 1 consistent with recurrent hepatocellular carcinoma there is also an area of nodular enhancement inferior to segment 5 previously treated hepatocellular carcinoma and the tumor board recommendation was to consider chemoembolization to both areas.

## 2022-10-02 NOTE — Telephone Encounter
Spoke with pt daughter about tb recommendations and daughter and pt would like to proceed with chemoembolization.  Let daughter know we will keep f/u scheduled as is and will move it up if needed.  Pt daughter verbalized understanding to all discussed

## 2022-10-03 ENCOUNTER — Encounter: Admit: 2022-10-03 | Discharge: 2022-10-03 | Payer: MEDICARE

## 2022-10-03 MED ORDER — FAMOTIDINE (PF) 20 MG/2 ML IV SOLN
20 mg | Freq: Once | INTRAVENOUS | 0 refills
Start: 2022-10-03 — End: ?

## 2022-10-03 MED ORDER — ONDANSETRON/DEXAMETHASONE IVPB
Freq: Once | INTRAVENOUS | 0 refills
Start: 2022-10-03 — End: ?

## 2022-10-03 MED ORDER — DOXORUBICIN/IOHEXOL/DRUG ELUTING BEADS (LC LUMI 70-150) EMBOLIZATION
Freq: Once | INTRA_ARTERIAL | 0 refills
Start: 2022-10-03 — End: ?

## 2022-10-03 MED ORDER — SODIUM CHLORIDE 0.9 % IV SOLP
INTRAVENOUS | 0 refills
Start: 2022-10-03 — End: ?

## 2022-10-09 ENCOUNTER — Encounter: Admit: 2022-10-09 | Discharge: 2022-10-09 | Payer: MEDICARE

## 2022-10-09 NOTE — Progress Notes
Received IR referral from [name] for retreatment with TACE.  Case reviewed by Dr. Thomasena Edis who agrees with this plan. Patient scheduled for procedure 10/11/22 10 AM.    Pre procedure instructions sent to patient's My Chart as follows:    Dear Sarah Pineda,    Thank you for choosing [facility] Interventional Radiology for your procedure. Your appointment information is listed below:    Appointment Date: Wednesday, September 18,2024  Appointment:  9:00AM  Location: Main Campus: [address], [place], [place]  [address]  Parking: [address] Park[address] Garage    Directions to Interventional Radiology [facility] - Walk into the front door of the hospital. Follow the hallway until you get to the six pack of elevators.  Take the elevators up to the second floor.  Follow the signs on the ceiling to Interventional Radiology.  Depending what elevator you are on, you will go down the hallway, past surgery waiting room (towards the window) and Interventional Radiology will be at the end of the hallway on the right hand side. This is where you will check in for your procedure.     INTERVENTIONAL RADIOLOGY  PRE-PROCEDURE INSTRUCTIONS SEDATION    You are scheduled for a TACE  procedure with Dr. Thomasena Edis in Interventional Radiology with procedural sedation.  Please follow these instructions and any direction from your Primary Care/Managing Physician.  If you have questions about your procedure or need to reschedule please call Lanora Manis, RN @ [phone] or send My Chart message.        Current Outpatient Medications on File Prior to Visit   Medication Sig Dispense Refill    aspirin EC 81 mg tablet Take one tablet by mouth daily after lunch. Take with food.   As of 08/08/21: ON HOLD due to all procedures/appointments. Last dose > 1 month ago. Pt to check in with Dr. Ladona Ridgel about resumption and when/if appropriate to start again.      azaTHIOprine (IMURAN) 50 mg tablet Take one tablet by mouth at bedtime daily. 90 tablet 3    calcium citrate/vitamin D3 (CALCIUM CITRATE + D PO) Take 1 tablet by mouth twice daily.      CONSTULOSE 10 gram/15 mL oral solution TAKE 30 ML BY MOUTH TWO TO THREE TIMES DAILY (TITRATES TO 2 - 3 BOWEL MOVEMENTS DAILY) 1896 mL 3    furosemide (LASIX) 20 mg tablet Take one tablet by mouth every morning. 90 tablet 3    omeprazole DR (PRILOSEC) 20 mg capsule Take one capsule by mouth daily before breakfast. 90 capsule 3    ondansetron HCL (ZOFRAN) 4 mg tablet Take one tablet by mouth every 6 hours as needed for Nausea or Vomiting. 30 tablet 0    ondansetron HCL (ZOFRAN) 8 mg tablet Take one tablet by mouth every 8 hours as needed (nausea and vomiting). 30 tablet 3    oxyCODONE (ROXICODONE) 5 mg tablet Take one tablet by mouth every 6 hours as needed for Pain. 20 tablet 0    oxyCODONE-acetaminophen (PERCOCET) 7.5-325 mg tablet Take one tablet by mouth every 12 hours as needed for Pain.      rifAXIMin (XIFAXAN) 550 mg tablet Take one tablet by mouth every 12 hours. 60 tablet 11    spironolactone (ALDACTONE) 50 mg tablet Take one tablet by mouth daily with breakfast. Take with food. 90 tablet 3     No current facility-administered medications on file prior to visit.      Medication Instructions:  You may take the following medications with a small sip of water:    Omeprazole  Pain or nausea meds if needed        Do not take the following medications:   Spironolactone  Xifaximin  Furosemide  Constulose  Aspirin  Vitamins/minerals    Diet Instructions:  (8) hours before your procedure (midnight), stop your regular diet and start a clear liquid diet.  (6) hours before your procedure, discontinue tube feedings and chewing tobacco.  (2) hours before your procedure (08:00AM) discontinue clear liquids.  You should have nothing by mouth. This includes GUM or CANDY.       Clear Liquid Diet:    Water    Apple or White Grape Juice    Coffee or tea without cream   Tea    White Cranberry Juice    Chicken Bouillon or Broth (no noodles)  Soda Pop    Popsicles     Beef Bouillon or Broth (no noodles)    Day of Exam Instructions:  Bathe or shower with an antibacterial soap prior to your appointment.  If you have a history of Obstructive Sleep Apnea (OSA) bring your CPAP/BIPAP.   Bring a list of your current medications and the dosages.  Wear comfortable clothing and leave valuables at home.  Arrive 1 hour prior to your appointment.  This time will be spent registering, interviewing, assessing, educating and preparing you for the test.        You will be with [place] anywhere from 30 minutes to 6 hours after your exam depending on your procedure.  You may be sedated for the procedure. A responsible adult must drive you home (no Benedetto Goad, taxis or buses are allowed) and stay with you overnight. If you do not have a driver we will be unable to perform your procedure.   You will not be able to return to work or drive the same day if receiving sedation.  You may be discharged home with post procedure prescriptions.  Please let your nurse coordinator know if you would like the scripts (if needed) sent to your local pharmacy or to the [facility] pharmacy located on the ground floor of the hospital.      We look forward to seeing you and thank you for allowing [place] to participate in your care.  Please call or send My Chart message with questions or concerns prior to your procedure.  [phone].    TRANSARTERIAL CHEMOEMBOLIZATION (TACE) Discharge Instructions  In Transarterial Chemoembolization, the blood supply to a liver tumor is blocked or embolized with microscopic beads called embospheres and a chemotherapeutic agent is injected directly into one or more liver tumors causing them to shrink.  During this procedure, a tiny catheter is placed in your femoral artery and then threaded into the arteries of your liver (as in an arteriogram) in order to deliver the chemoembolization to the tumor.  POST-PROCEDURE ACTIVITY:  A responsible adult must drive you home after the procedure.    If you receive sedation or anesthesia, do not drive, operate heavy machinery or do anything that requires concentration for at least 24 hours.  It is recommended that a responsible adult be with you until morning.  Avoid any exertion for one week.  Exertion is lifting over 10 lbs., pushing, pulling or straining.  Avoid excessive bending, stooping, or stair climbing for 2 days.  It is okay to go upstairs or bend over but take it slowly and keep it to a minimum.  You may be up and about while relaxing at home as you recover.  POST-PROCEDURE SITE CARE:  You will have a bandage over the site.  Keep this dry.  You may remove it in 24 hours.   You may shower in 24 hours after removing the bandage.  Wash and dry the site gently.  Do not submerge the site underwater for one week (no tub bath, swimming, hot tub, etc.)  Do not use ointments, creams or powders on the puncture site.  Be sure your hands are clean when touching near the site.  Inspect the site daily.  DIET/MEDICATIONS:  Bonita Quin may resume your previous diet after the procedure.  If you receive sedation or narcotic pain medications, avoid any foods or beverages containing alcohol for at least 24 hours.  Please see the Medication Reconciliation sheet for instructions regarding resuming your home medications.  WHEN TO CALL THE DOCTOR:                      If you have significant bleeding (more than a teaspoon) or swelling at the site (bigger than a golf ball), lie down, apply firm pressure to the site and call 911. Bleeding from a large vessel requires professional help.  If you have signs of infection such as: Chills, body aches, fever greater than 101?F, redness, swelling or warmth at the puncture site.  If you have severe abdominal pain or swelling.  If you have persistent nausea or vomiting.  If you have soreness that continues for more than a week or unusual pain at the puncture site.  It is common to have mild soreness or slight swelling at the site for up to 2 weeks.  If you have numbness, tingling, weakness in the leg below the puncture site or your leg becomes cold and pale.  You or your caregiver should call 911 for severe symptoms such as excessive bleeding, severe dizziness, chest pain, shortness of breath or loss of consciousness.  For any of the above symptoms or for problems or concerns related to the procedure,  call [phone] for Monday-Friday 7-5.  After-hours and weekends, please call  [phone] and ask for the Interventional Radiology Resident on-call.

## 2022-10-11 ENCOUNTER — Encounter: Admit: 2022-10-11 | Discharge: 2022-10-11 | Payer: MEDICARE

## 2022-10-11 ENCOUNTER — Ambulatory Visit: Admit: 2022-10-11 | Discharge: 2022-10-11 | Payer: MEDICARE

## 2022-10-11 DIAGNOSIS — C220 Liver cell carcinoma: Secondary | ICD-10-CM

## 2022-10-11 MED ORDER — MIDAZOLAM 1 MG/ML IJ SOLN
0 refills | Status: CP
Start: 2022-10-11 — End: ?

## 2022-10-11 MED ORDER — ONDANSETRON HCL 4 MG PO TAB
4 mg | ORAL_TABLET | ORAL | 0 refills | 8.00000 days | Status: AC | PRN
Start: 2022-10-11 — End: ?
  Filled 2022-10-11: qty 30, 10d supply, fill #1

## 2022-10-11 MED ORDER — SODIUM CHLORIDE 0.9 % IV SOLP
INTRAVENOUS | 0 refills | Status: DC
Start: 2022-10-11 — End: 2022-10-11
  Administered 2022-10-11: 15:00:00 1000.0000 mL via INTRAVENOUS

## 2022-10-11 MED ORDER — MIDAZOLAM 1 MG/ML IJ SOLN
1 mg | Freq: Once | INTRAVENOUS | 0 refills | Status: CP
Start: 2022-10-11 — End: ?
  Administered 2022-10-11: 15:00:00 1 mg via INTRAVENOUS

## 2022-10-11 MED ORDER — FENTANYL CITRATE (PF) 50 MCG/ML IJ SOLN
0 refills | Status: CP
Start: 2022-10-11 — End: ?

## 2022-10-11 MED ORDER — AMPICILLIN/SULBACTAM 3G/100ML NS IVPB (MB+)
3 g | Freq: Once | INTRAVENOUS | 0 refills | Status: CP
Start: 2022-10-11 — End: ?
  Administered 2022-10-11 (×2): 3 g via INTRAVENOUS

## 2022-10-11 MED ORDER — IOHEXOL 300 MG IODINE/ML IV SOLN
70 mL | Freq: Once | INTRA_ARTERIAL | 0 refills | Status: CP
Start: 2022-10-11 — End: ?
  Administered 2022-10-11: 17:00:00 70 mL via INTRA_ARTERIAL

## 2022-10-11 MED ORDER — ONDANSETRON/DEXAMETHASONE IVPB
Freq: Once | INTRAVENOUS | 0 refills | Status: CP
Start: 2022-10-11 — End: ?
  Administered 2022-10-11 (×3): 60.0000 mL via INTRAVENOUS

## 2022-10-11 MED ORDER — FAMOTIDINE (PF) 20 MG/2 ML IV SOLN
20 mg | Freq: Once | INTRAVENOUS | 0 refills | Status: CP
Start: 2022-10-11 — End: ?
  Administered 2022-10-11: 15:00:00 20 mg via INTRAVENOUS

## 2022-10-11 MED ORDER — DOXORUBICIN/IOHEXOL/DRUG ELUTING BEADS (LC LUMI 70-150) EMBOLIZATION
Freq: Once | INTRA_ARTERIAL | 0 refills | Status: CP
Start: 2022-10-11 — End: ?
  Administered 2022-10-11 (×3): 20.0000 mL via INTRA_ARTERIAL

## 2022-10-11 MED ORDER — OXYCODONE 5 MG PO TAB
5 mg | ORAL_TABLET | ORAL | 0 refills | 6.00000 days | Status: AC | PRN
Start: 2022-10-11 — End: ?
  Filled 2022-10-11: qty 20, 5d supply, fill #1

## 2022-10-11 NOTE — Progress Notes
Sedation physician present in room. Recent vitals and patient condition reviewed between sedating physician and nurse. Reassessment completed. Determination made to proceed with planned sedation.

## 2022-10-11 NOTE — H&P (View-Only)
Pre Procedure History and Physical/Sedation Plan-OP    Procedure Date: 10/11/2022     Planned Procedure(s):  TACE    Indication for exam:  HCC  __________________________________________________________________    Chief Complaint:  See above     History of Present Illness: Sarah Pineda is a 72 y.o. female w hx as noted below in brief. Patient reports feeling well on exam today without complaint. Patient reports tolerating previous TACE's without issue.     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) 08/07/2017    Non-small cell lung cancer (HCC) 08/03/2017    Pneumothorax of left lung after biopsy 07/16/2017    Amputation of right lower extremity (HCC) 07/16/2017    Lung nodules 06/19/2017    Hepatocellular carcinoma (HCC) 05/27/2017    Immunosuppression (HCC) 05/27/2017    Autoimmune hepatitis (HCC) 03/18/2017    End stage liver disease (HCC) 03/14/2017    Elevated liver enzymes 03/13/2017    Elevated INR 03/13/2017    Hyponatremia 03/13/2017    Liver cirrhosis secondary to NASH (HCC) 03/13/2017    History of squamous cell carcinoma 11/28/2010    Skin lesion 11/28/2010     Past Medical History:   Diagnosis Date    Cancer (HCC)     bone/ right leg    Diverticulosis     Emphysema lung (HCC)     GERD (gastroesophageal reflux disease)     Tums PRN    Hepatitis     Hepatocellular carcinoma (HCC)     Immune disorder (HCC)     Non-small cell lung cancer (HCC)     Nonalcoholic steatohepatitis     Pulmonary nodule, left     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    Squamous cell cancer of lip       Surgical History:   Procedure Laterality Date    AMPUTATION  1970    right leg-bone cancer    HX TUBAL LIGATION  1977    HX CHOLECYSTECTOMY  2004 ESOPHAGOGASTRODUODENOSCOPY WITH BIOPSY - FLEXIBLE N/A 05/18/2017    Performed by Virgina Organ, MD at [facility] ENDO    COLONOSCOPY DIAGNOSTIC WITH SPECIMEN COLLECTION BY BRUSHING/ WASHING - FLEXIBLE N/A 05/18/2017    Performed by Virgina Organ, MD at [facility] ENDO    LUNG SURGERY  07/24/2017    removal of lung wedge    left video assisted thoracoscopy with wedge resection Left 07/24/2017    Performed by Bryson Dames, MD at [facility] CVOR    ESOPHAGOGASTRODUODENOSCOPY WITH SPECIMEN COLLECTION BY BRUSHING/ WASHING N/A 05/23/2019    Performed by Dawna Part, MD at [facility] ENDO    BRONCHOSCOPY DIAGNOSTIC WITH/ WITHOUT CELL WASHING - FLEXIBLE N/A 12/05/2019    Performed by Boykin Nearing, Maykol R, MD at [facility] OR    BRONCHOSCOPY WITH IMAGE - GUIDED NAVIGATION - FLEXIBLE N/A 12/05/2019    Performed by Boykin Nearing, Maykol R, MD at [facility] OR    BRONCHOSCOPY WITH ENDOBRONCHIAL ULTRASOUND GUIDED TRANSTRACHEAL/ TRANSBRONCHIAL SAMPLING - 3 OR MORE MEDIASTINAL/ HILAR LYMPH NODE STATIONS/ STRUCTURE - FLEXIBLE N/A 12/05/2019    Performed by Boykin Nearing, Maykol R, MD at [facility] OR    BRONCHOSCOPY WITH TRANSBRONCHIAL LUNG BIOPSY - FLEXIBLE - SINGLE LOBE N/A 12/05/2019  Performed by Boykin Nearing, Maykol R, MD at [facility] OR    BRONCHOSCOPY WITH TRANSBRONCHIAL NEEDLE ASPIRATION AND BIOPSY TRACHEA/ MAIN STEM/ LOBAR BRONCHUS - FLEXIBLE N/A 12/05/2019    Performed by Boykin Nearing, Maykol R, MD at [facility] OR    BRONCHOSCOPY WITH BRUSHING/ PROTECTED BRUSHING - FLEXIBLE N/A 12/05/2019    Performed by Boykin Nearing, Maykol R, MD at [facility] OR    ESOPHAGOGASTRODUODENOSCOPY WITH SPECIMEN COLLECTION BY BRUSHING/ WASHING N/A 11/25/2021    Performed by Dawna Part, MD at [facility] ENDO    HX TONSILLECTOMY      LIVER DONOR SURGERY      MWA 08/04/17, 02/07/19    LYMPH NODE BIOPSY      back    SKIN CANCER EXCISION      sqc- lip and nose      Medications Prior to Admission   Medication Sig Dispense Refill Last Dose    aspirin EC 81 mg tablet Take one tablet by mouth daily after lunch. Take with food.   As of 08/08/21: ON HOLD due to all procedures/appointments. Last dose > 1 month ago. Pt to check in with Dr. Ladona Ridgel about resumption and when/if appropriate to start again.       azaTHIOprine (IMURAN) 50 mg tablet Take one tablet by mouth at bedtime daily. 90 tablet 3     calcium citrate/vitamin D3 (CALCIUM CITRATE + D PO) Take 1 tablet by mouth twice daily.       CONSTULOSE 10 gram/15 mL oral solution TAKE 30 ML BY MOUTH TWO TO THREE TIMES DAILY (TITRATES TO 2 - 3 BOWEL MOVEMENTS DAILY) 1896 mL 3     furosemide (LASIX) 20 mg tablet Take one tablet by mouth every morning. 90 tablet 3     omeprazole DR (PRILOSEC) 20 mg capsule Take one capsule by mouth daily before breakfast. 90 capsule 3     ondansetron HCL (ZOFRAN) 4 mg tablet Take one tablet by mouth every 6 hours as needed for Nausea or Vomiting. 30 tablet 0     ondansetron HCL (ZOFRAN) 8 mg tablet Take one tablet by mouth every 8 hours as needed (nausea and vomiting). 30 tablet 3     oxyCODONE (ROXICODONE) 5 mg tablet Take one tablet by mouth every 6 hours as needed for Pain. 20 tablet 0     oxyCODONE-acetaminophen (PERCOCET) 7.5-325 mg tablet Take one tablet by mouth every 12 hours as needed for Pain.       rifAXIMin (XIFAXAN) 550 mg tablet Take one tablet by mouth every 12 hours. 60 tablet 11     spironolactone (ALDACTONE) 50 mg tablet Take one tablet by mouth daily with breakfast. Take with food. 90 tablet 3      Allergies   Allergen Reactions    Ciprofloxacin BLISTERS    Latex RASH and REDNESS     Pt states it tore up her skin       Social History:   Social History     Tobacco Use    Smoking status: Former     Current packs/day: 0.00     Average packs/day: 0.5 packs/day for 50.0 years (25.0 ttl pk-yrs)     Types: Cigarettes     Start date: 03/11/1967     Quit date: 03/10/2017     Years since quitting: 5.5    Smokeless tobacco: Never   Substance Use Topics    Alcohol use: Not Currently     Comment: couple times/year Family History   Problem  Relation Name Age of Onset    Heart Failure Mother Jacquelynn Cree     Cancer Mother M  S         Stomach    Heart Failure Father R. S         bypass    Heart Disease Father R. S     Stroke Brother S S         age 75    Cancer-Colon Brother S S     Aneurysm Brother S S     Diabetes Brother S S     Abnormal EKG Brother S S         Afib    Cancer Sister C. D         Pancreatic    Heart Disease Maternal Grandfather R  Y     Melanoma Neg Hx          Review of Systems  Constitutional: negative for fevers and chills  Respiratory: negative for cough or dyspnea  Gastrointestinal: negative for nausea, vomiting, and abdominal pain    Previous Anesthetic/Sedation History:  Denies adverse events r/t sedation/anesthesia.      Code Status: Full Code    Physical Exam:  Vital Signs: Last Filed In 24 Hours Vital Signs: 24 Hour Range                  General appearance: alert and no distress  Neurologic: Grossly normal, at baseline  Lungs: Nonlabored with normal effort    Airway:  airway assessment performed  Mallampati II (soft palate, uvula, fauces visible)   Anesthesia Classification:  ASA III (A patient with a severe systemic disease that limits activity, but is not incapacitating)  Pre procedure anxiolysis plan: Midazolam  Sedation/Medication Plan: Fentanyl, Lidocaine, and Midazolam  Personal history of sedation complications: Denies adverse event.   Family history of sedation complications: Denies adverse event.   Medications for Reversal: Naloxone and Flumazenil  Discussion/Reviews:  Physician has discussed risks and alternatives of this type of sedation and above planned procedures with patient  NPO Status: Acceptable  Pregnancy Status: Not Pregnant      Pt is not on a therapeutic blood thinner  Lab/Radiology/Other Diagnostic Tests:  Labs:  Hematology:    Lab Results   Component Value Date    HGB 11.5 09/29/2022    HCT 33.1 09/29/2022    PLTCT 366 09/29/2022    WBC 5.3 09/29/2022    NEUT 85 09/29/2022 ANC 4.60 09/29/2022    LYMPH 25.7 06/04/2017    ALC 0.30 09/29/2022    MONA 5 09/29/2022    AMC 0.30 09/29/2022    EOSA 3 09/29/2022    ABC 0.00 09/29/2022    BASOPHILS 0.8 06/04/2017    MCV 91.0 09/29/2022    MCH 31.6 09/29/2022    MCHC 34.8 09/29/2022    MPV 7.5 09/29/2022    RDW 14.9 09/29/2022   , Coagulation:    Lab Results   Component Value Date    PT 12.2 09/29/2022    INR 1.1 09/29/2022   , and General Chemistry:    Lab Results   Component Value Date    NA 135 09/29/2022    K 4.0 09/29/2022    CL 97 09/29/2022    CO2 31 09/29/2022    GAP 7 09/29/2022    BUN 18 09/29/2022    CR 1.1 09/29/2022    CR 0.92 09/29/2022    GLU 90 09/29/2022    GLU  93 08/20/2017    CA 9.8 09/29/2022    ALBUMIN 4.0 09/29/2022    MG 1.9 07/23/2017    TOTBILI 0.3 09/29/2022    PO4 4.2 07/23/2017              Gus Height, APRN-NP  Pager [phone]

## 2022-10-11 NOTE — Progress Notes
Pt recovery complete.     Ambulatory status: Ambulatory.   Vitals stable. Dressing is clean, dry, and intact.  Pain level returned to baseline.  Discharge instructions reviewed with: patient and family.   AVS signed and provided to patient.    Pt discharged to main lobby via wheelchair transport.

## 2022-10-11 NOTE — Unmapped
Immediate Post Procedure Note    Date:  10/11/2022                                         Attending Physician:   Dr. Thomasena Edis  Performing Provider:  Duard Brady, MD    Consent:  Consent obtained from patient.  Time out performed: Consent obtained, correct patient verified, correct procedure verified, correct site verified, patient marked as necessary.  Pre/Post Procedure Diagnosis:  HCC   Indications:  as abvoe      Procedure(s):  TACE  Findings:  Successful chemoembolization of masses within segment 5 and segment 1.      Estimated Blood Loss:  None/Negligible  Specimen(s) Removed/Disposition:  None  Complications: None  Patient Tolerated Procedure: Well  Post-Procedure Condition:  stable    Duard Brady, MD

## 2022-10-23 ENCOUNTER — Encounter: Admit: 2022-10-23 | Discharge: 2022-10-23 | Payer: MEDICARE

## 2022-10-23 DIAGNOSIS — K7210 Chronic hepatic failure without coma: Secondary | ICD-10-CM

## 2022-10-23 DIAGNOSIS — K754 Autoimmune hepatitis: Secondary | ICD-10-CM

## 2022-10-23 MED ORDER — AZATHIOPRINE 50 MG PO TAB
50 mg | ORAL_TABLET | Freq: Every evening | ORAL | 3 refills | Status: AC
Start: 2022-10-23 — End: ?

## 2022-10-23 MED ORDER — XIFAXAN 550 MG PO TAB
550 mg | ORAL_TABLET | Freq: Two times a day (BID) | ORAL | 11 refills | 30.00000 days | Status: AC
Start: 2022-10-23 — End: ?

## 2022-10-23 NOTE — Telephone Encounter
Nurse Coordinator called to confirm medication refill and preferred pharmacy. Sent as requested.     Wilmon Arms, MSN, RN

## 2022-10-31 ENCOUNTER — Encounter: Admit: 2022-10-31 | Discharge: 2022-10-31 | Payer: MEDICARE

## 2022-10-31 DIAGNOSIS — K7682 Hepatic encephalopathy (HCC): Secondary | ICD-10-CM

## 2022-10-31 DIAGNOSIS — K7469 Other cirrhosis of liver: Secondary | ICD-10-CM

## 2022-10-31 MED ORDER — CONSTULOSE 10 GRAM/15 ML PO SOLN
ORAL | 3 refills | 21.00000 days | Status: AC
Start: 2022-10-31 — End: ?

## 2022-11-07 ENCOUNTER — Ambulatory Visit: Admit: 2022-11-07 | Discharge: 2022-11-07 | Payer: MEDICARE

## 2022-11-07 ENCOUNTER — Encounter: Admit: 2022-11-07 | Discharge: 2022-11-07 | Payer: MEDICARE

## 2022-11-07 DIAGNOSIS — C220 Liver cell carcinoma: Secondary | ICD-10-CM

## 2022-11-07 DIAGNOSIS — Z23 Encounter for immunization: Secondary | ICD-10-CM

## 2022-11-07 DIAGNOSIS — K7581 Nonalcoholic steatohepatitis (NASH): Secondary | ICD-10-CM

## 2022-11-07 DIAGNOSIS — K754 Autoimmune hepatitis: Secondary | ICD-10-CM

## 2022-11-07 DIAGNOSIS — R911 Solitary pulmonary nodule: Secondary | ICD-10-CM

## 2022-11-07 DIAGNOSIS — K5790 Diverticulosis of intestine, part unspecified, without perforation or abscess without bleeding: Secondary | ICD-10-CM

## 2022-11-07 DIAGNOSIS — R188 Other ascites: Secondary | ICD-10-CM

## 2022-11-07 DIAGNOSIS — C801 Malignant (primary) neoplasm, unspecified: Secondary | ICD-10-CM

## 2022-11-07 DIAGNOSIS — C4402 Squamous cell carcinoma of skin of lip: Secondary | ICD-10-CM

## 2022-11-07 DIAGNOSIS — J439 Emphysema, unspecified: Secondary | ICD-10-CM

## 2022-11-07 DIAGNOSIS — C499 Malignant neoplasm of connective and soft tissue, unspecified: Secondary | ICD-10-CM

## 2022-11-07 DIAGNOSIS — D899 Disorder involving the immune mechanism, unspecified: Secondary | ICD-10-CM

## 2022-11-07 DIAGNOSIS — I8511 Secondary esophageal varices with bleeding: Secondary | ICD-10-CM

## 2022-11-07 DIAGNOSIS — K7682 Hepatic encephalopathy (HCC): Secondary | ICD-10-CM

## 2022-11-07 DIAGNOSIS — K219 Gastro-esophageal reflux disease without esophagitis: Secondary | ICD-10-CM

## 2022-11-07 DIAGNOSIS — K759 Inflammatory liver disease, unspecified: Secondary | ICD-10-CM

## 2022-11-07 DIAGNOSIS — K7469 Other cirrhosis of liver: Secondary | ICD-10-CM

## 2022-11-07 DIAGNOSIS — C3490 Malignant neoplasm of unspecified part of unspecified bronchus or lung: Secondary | ICD-10-CM

## 2022-11-07 NOTE — Progress Notes
Hepatology Clinic Note  Patient Name:Sarah Pineda - DOB: 05-05-1950- ZOX:[id number] - Date of Service: 11/07/2022         Impression/Assessment/Plan/Recommendations:  72 y.o. female seen today for Cirrhosis and Autoimmune Hepatitis             Autoimmune Hepatitis  Diagnosed with biopsy.  Positive ANA and elevated IgG level.  Stable on low dose azathioprine (Imuran). Remains in biochemical remission. No reported side effects or missed doses. Normal liver enzymes on most recent labs Sept 2024.  -Continue azathioprine at current low dose. Benefits of preventing flare with underlying cirrhosis would appear to outweigh risks with her cancer history.     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  Decompensation with hepatic encephalopathy, trace ascites on imaging, and small esophageal varices.  Overall low MELD scores most recently September 2024 at 11.  Not an OLT candidate due to low MELD scores and cancer history.  Patient  -Monitor MELD labs every 3 months  -EGD November 2023 with small esophageal varices, recommend repeat EGD in 1 year for surveillance  -No symptoms of hepatic encephalopathy on lactulose and Xifaxan, continue current regimen.  -Denies signs of fluid volume overload, continue diuretics and low-sodium diet less than 2000 mg/day    Hepatocellular carcinoma  She has a history of biopsy-proven hepatocellular carcinoma initially diagnosed in April 2019 and then treated with microwave ablation and TACE in June 2019, with follow up treatment later in July 2019.  She then had recurrence in January 2021, status post additional microwave ablation as well as most recently requiring treatment with microwave ablation July 2023.  Imaging September 2024 showed segment 5 LI-RADS treated viable lesion, increased in LI-RADS 5 lesion in the caudate lobe, and LI-RADS 3 liver lesion.  She underwent TACE procedure later that month and reports no residual effects.  AFP was elevated at 100.5 from labs September 2024.  No evidence of thoracic metastasis.  -Continue following with [name] oncology with next imaging and labs scheduled November 2024.    Non-small cell lung cancer  Patient had initial resection of stage 1 non-small cell lung cancer in June 2019.  She has had concerns for recurrent disease seen on CT and PET scan with EBUS.  She has been followed in Medical Oncology and Radiation Oncology Clinic. With imaging June 2023 with increasing size of pulmonary nodule concerning for metastasis patient underwent a biopsy showing squamous cell carcinoma.  She then underwent treatment with SBRT September 2023.  She has elected not to proceed with adjuvant chemotherapy or immunotherapy.  No recurrence on imaging September 2024  -Continue following with oncology team    History of squamous cell carcinoma  Previously diagnosed on her face and she reports she is up-to-date on follow-up with dermatology  -Continue following with dermatology with use of chronic immunosuppression and history of SCC    Colon cancer screening  Colonoscopy April 2019 without polyps  -Repeat colonoscopy in 10 years    Osteoporosis  Diagnosed on DEXA May 2022.  Patient established care with endocrinology but has declined to pursue prescription treatment at this time  -Continue calcium and vitamin D in addition to weightbearing exercise for treatment    History of H. pylori  Diagnosed on gastric biopsy and has completed triple therapy.  Recommend follow-up testing with H. pylori stool, patient prefers to defer at this time and focus on cancer treatments    Anemia  Hemoglobin trending down to 11.5 from labs September 2024.  -Update iron  studies with ferritin with next labs    LA grade a esophagitis  Diagnosed on prior EGD  -Continue PPI and will reassess with next EGD    Vaccinations  Patient to receive flu shot today. Plans to receive RSV vaccine and shingles vaccine in the future.  -Administer flu shot today. Patient to arrange RSV and shingles vaccines at a later date.  -Immune to hepatitis A  -Not immune to hepatitis B after completing vaccination series.  Consider obtaining Heplisav-B vaccine series and would check for immunity upon completion    Pain Management  Patient takes oxycodone as needed for abdominal pain, leading to occasional constipation.  -Continue current pain management strategy. Use lactulose as needed for constipation.             ICD-9-CM ICD-10-CM    1. Other cirrhosis of liver (HCC)  571.5 K74.69 AMB REFERRAL TO GI LAB FOR PROCEDURE      FLU VACCINE =>66 YO HIGH-DOSE TRIVALENT PF      IRON + BINDING CAPACITY + %SAT+ FERRITIN      2. Encounter for immunization  V03.89 Z23 AMB REFERRAL TO GI LAB FOR PROCEDURE      FLU VACCINE =>65 YO HIGH-DOSE TRIVALENT PF      IRON + BINDING CAPACITY + %SAT+ FERRITIN      3. Autoimmune hepatitis (HCC)  571.42 K75.4 AMB REFERRAL TO GI LAB FOR PROCEDURE      FLU VACCINE =>66 YO HIGH-DOSE TRIVALENT PF      IRON + BINDING CAPACITY + %SAT+ FERRITIN      4. Secondary esophageal varices with bleeding (HCC)  456.20 I85.11       5. Other ascites  789.59 R18.8       6. Hepatic encephalopathy (HCC)  572.2 K76.82          Follow up: Return for next scheduled office visit March 2025 with Dr. Ladona Ridgel        45 minutes spent on this patient's encounter with counseling and coordination of care taking >50% of the visit.   _____________________________  Coralee North APRN  Gastroenterology and Transplant Hepatology  [facility]   ==========================================================  Subjective    History of Present Illness  72 y.o. female who is seen today for Cirrhosis and Autoimmune Hepatitis    Patient presents to clinic today accompanied, last seen March 2024 by by myself.      The patient, with a history of autoimmune hepatitis and liver cancer, presents for a routine follow-up. She reports no changes in her medication regimen, which includes azathioprine, lactulose, Xifaxan, furosemide, spironolactone, and Alprazolam. The patient reports occasional abdominal pain, which she describes as feeling really full and sometimes lasting for a few hours. She notes that the pain seems to be unrelated to specific foods and usually resolves on its own. The patient also mentions a history of H. pylori infection, which was treated in the past. She has been managing her bone health without any specific treatment or infusions. The patient is due for a surveillance EGD and has questions about the shingles vaccine.            Today, the patient denies abdominal pain, nausea, vomiting, bowel movement changes, blood in the stool, jaundice, pruritus, fluid retention, day-night reversal and episodes of confusion.     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    Squamous cell cancer of lip       Medications:   Current Outpatient Medications   Medication Instructions    azaTHIOprine (IMURAN) 50 mg, Oral, AT BEDTIME DAILY    calcium citrate/vitamin D3 (CALCIUM CITRATE + D PO) 1 tablet, Oral, TWICE DAILY    furosemide (LASIX) 20 mg, Oral, EVERY MORNING    lactulose (CONSTULOSE) 10 gram/15 mL oral solution TAKE 30 ML BY MOUTH TWO TO THREE TIMES DAILY (TITRATES TO 2-3 BOWEL MOVEMENTS DAILY)    omeprazole DR (PRILOSEC) 20 mg, Oral, DAILY BEFORE BREAKFAST    ondansetron HCL (ZOFRAN) 8 mg, Oral, EVERY  8 HOURS PRN    ondansetron HCL (ZOFRAN) 4 mg, Oral, EVERY  6 HOURS PRN    ondansetron HCL (ZOFRAN) 4 mg, Oral, EVERY  8 HOURS PRN    oxyCODONE (ROXICODONE) 5 mg, Oral, EVERY 6 HOURS PRN    oxyCODONE (ROXICODONE) 5 mg, Oral, EVERY  6 HOURS PRN    oxyCODONE-acetaminophen (PERCOCET) 7.5-325 mg tablet 1 tablet, Oral, EVERY 12 HOURS PRN    spironolactone (ALDACTONE) 50 mg, Oral, DAILY WITH BREAKFAST, Take with food.    XIFAXAN 550 mg, Oral, EVERY 12 HOURS     Surgical History:  Surgical History:   Procedure Laterality Date    AMPUTATION  1970    right leg-bone cancer    HX TUBAL LIGATION  1977    HX CHOLECYSTECTOMY  2004    ESOPHAGOGASTRODUODENOSCOPY WITH BIOPSY - FLEXIBLE N/A 05/18/2017    Performed by Virgina Organ, MD at [facility] ENDO    COLONOSCOPY DIAGNOSTIC WITH SPECIMEN COLLECTION BY BRUSHING/ WASHING - FLEXIBLE N/A 05/18/2017    Performed by Virgina Organ, MD at [facility] ENDO    LUNG SURGERY  07/24/2017    removal of lung wedge    left video assisted thoracoscopy with wedge resection Left 07/24/2017    Performed by Bryson Dames, MD at [facility] CVOR    ESOPHAGOGASTRODUODENOSCOPY WITH SPECIMEN COLLECTION BY BRUSHING/ WASHING N/A 05/23/2019    Performed by Dawna Part, MD at [facility] ENDO    BRONCHOSCOPY DIAGNOSTIC WITH/ WITHOUT CELL WASHING - FLEXIBLE N/A 12/05/2019    Performed by Boykin Nearing, Maykol R, MD at [facility] OR    BRONCHOSCOPY WITH IMAGE - GUIDED NAVIGATION - FLEXIBLE N/A 12/05/2019    Performed by Boykin Nearing, Maykol R, MD at [facility] OR    BRONCHOSCOPY WITH ENDOBRONCHIAL ULTRASOUND GUIDED TRANSTRACHEAL/ TRANSBRONCHIAL SAMPLING - 3 OR MORE MEDIASTINAL/ HILAR LYMPH NODE STATIONS/ STRUCTURE - FLEXIBLE N/A 12/05/2019    Performed by Boykin Nearing, Maykol R, MD at [facility] OR    BRONCHOSCOPY WITH TRANSBRONCHIAL LUNG BIOPSY - FLEXIBLE - SINGLE LOBE N/A 12/05/2019    Performed by Boykin Nearing, Maykol R, MD at [facility] OR    BRONCHOSCOPY WITH TRANSBRONCHIAL NEEDLE ASPIRATION AND BIOPSY TRACHEA/ MAIN STEM/ LOBAR BRONCHUS - FLEXIBLE N/A 12/05/2019    Performed by Boykin Nearing, Maykol R, MD at [facility] OR    BRONCHOSCOPY WITH  BRUSHING/ PROTECTED BRUSHING - FLEXIBLE N/A 12/05/2019    Performed by Boykin Nearing, Maykol R, MD at [facility] OR    ESOPHAGOGASTRODUODENOSCOPY WITH SPECIMEN COLLECTION BY BRUSHING/ WASHING N/A 11/25/2021    Performed by Dawna Part, MD at [facility] ENDO    HX TONSILLECTOMY      LIVER BIOPSY      LIVER DONOR SURGERY      MWA 08/04/17, 02/07/19    LYMPH NODE BIOPSY      back    SKIN CANCER EXCISION      sqc- lip and nose       Social History:   reports that she quit smoking about 5 years ago. Her smoking use included cigarettes. She started smoking about 55 years ago. She has a 25 pack-year smoking history. She has never used smokeless tobacco. She reports that she does not currently use alcohol. She reports that she does not use drugs.     Family History:  Family History   Problem Relation Name Age of Onset    Heart Failure Mother Jacquelynn Cree     Cancer Mother M  S         Stomach    Heart Failure Father R. S         bypass    Heart Disease Father R. S     Stroke Brother S S         age 19    Cancer-Colon Brother S S     Aneurysm Brother S S     Diabetes Brother S S     Abnormal EKG Brother S S         Afib    Cancer Sister C. D         Pancreatic    Heart Disease Maternal Grandfather R  Y     Melanoma Neg Hx       @SUBJECTIVEND @  Objective     Physical Exam  BP 123/66 (BP Source: Arm, Left Upper, Patient Position: Sitting)  - Pulse 90  - Temp 36.8 ?C (98.3 ?F) (Oral)  - Ht 167.6 cm (5' 6)  - Wt 74.4 kg (164 lb)  - SpO2 96%  - BMI 26.47 kg/m?     Constitutional: Well-developed, well-nourished, in no apparent distress.  Presents in wheelchair with crutches  HEENT: Normocephalic. No scleral icterus.    Cardiac:  Regular rate and rhythm.  No overt murmurs  Respiratory: Clear to auscultation bilaterally.  No wheezes or rales  GI:  Abdomen soft, non-distended, non-tender. BS present. No shifting dullness. No overt hepatosplenomegaly.     Skin:  Skin is warm and dry. No rash noted.  No jaundice. No spider nevi noted. No palmar erythema  Musculoskeletal:  No lower extremity edema. No muscle wasting  Psychiatric: Normal mood and affect. Behavior is normal.  Neuro:  No asterixis, No tremor     Labs/Imaging/Procedures and Diagnostic tests:  As reviewed in HPI unless otherwise specified    Labs / Imaging / Endo / Proc  Pertinent labs/imaging were reviewed on initiation of note and below:    Lab Results   Component Value Date    TOTPROT 8.0 09/29/2022    ALBUMIN 4.0 09/29/2022    TOTBILI 0.3 09/29/2022    ALKPHOS 103 09/29/2022    AST 18 09/29/2022    ALT 8 09/29/2022      Lab Results   Component Value Date    NA 135 (L) 09/29/2022    K  4.0 09/29/2022    BUN 18 09/29/2022    CR 1.1 (H) 09/29/2022      Lab Results   Component Value Date    WBC 5.3 09/29/2022    HGB 11.5 (L) 09/29/2022    PLTCT 366 09/29/2022    INR 1.1 09/29/2022        MELD 3.0: 11 at 09/29/2022  1:19 PM  MELD-Na: 8 at 09/29/2022  1:19 PM  Calculated from:  Serum Creatinine: 1.1 MG/DL at 02/01/1476  2:95 PM  Serum Sodium: 135 MMOL/L at 09/29/2022  1:00 PM  Total Bilirubin: 0.3 MG/DL (Using min of 1 MG/DL) at 06/25/1306  6:57 PM  Serum Albumin: 4.0 G/DL (Using max of 3.5 G/DL) at 08/26/6960  9:52 PM  INR(ratio): 1.1 at 09/29/2022  1:00 PM  Age at listing (hypothetical): 3 years  Sex: Female at 09/29/2022  1:19 PM     Alpha Feto Protein   Date Value Ref Range Status   09/29/2022 100.5 (H) 0.0 - 15.0 NG/ML Final

## 2022-11-11 ENCOUNTER — Encounter: Admit: 2022-11-11 | Discharge: 2022-11-11 | Payer: MEDICARE

## 2022-11-11 DIAGNOSIS — K7210 Chronic hepatic failure without coma: Secondary | ICD-10-CM

## 2022-11-11 DIAGNOSIS — K754 Autoimmune hepatitis: Secondary | ICD-10-CM

## 2022-11-11 MED ORDER — FUROSEMIDE 20 MG PO TAB
20 mg | ORAL_TABLET | Freq: Every morning | ORAL | 0 refills
Start: 2022-11-11 — End: ?

## 2022-11-17 ENCOUNTER — Encounter: Admit: 2022-11-17 | Discharge: 2022-11-17 | Payer: MEDICARE

## 2022-11-27 ENCOUNTER — Encounter: Admit: 2022-11-27 | Discharge: 2022-11-27 | Payer: MEDICARE

## 2022-11-29 ENCOUNTER — Encounter: Admit: 2022-11-29 | Discharge: 2022-11-29 | Payer: MEDICARE

## 2022-11-29 ENCOUNTER — Ambulatory Visit: Admit: 2022-11-29 | Discharge: 2022-11-30 | Payer: MEDICARE

## 2022-11-29 DIAGNOSIS — C220 Liver cell carcinoma: Secondary | ICD-10-CM

## 2022-11-30 NOTE — Progress Notes
Name: Sarah Pineda          MRN: [id number]      DOB: 1950/02/01      AGE: 72 y.o.   DATE OF SERVICE: 11/29/2022    Subjective:             Reason for Visit:  No chief complaint on file.      ANNAKATE Pineda is a 72 y.o. female.      Cancer Staging   Hepatocellular carcinoma (HCC)  Staging form: Liver, AJCC 8th Edition  - Clinical: cT1, cN0, cM0 - Signed by Dayton Bailiff, MD on 06/06/2017    Non-small cell lung cancer (HCC)  Staging form: Lung, AJCC 8th Edition  - Pathologic: Stage IA2 (pT1b, pN0, cM0) - Signed by Dayton Bailiff, MD on 08/03/2017  - Pathologic: No stage assigned - Unsigned        History of Present Illness     Sarah Pineda history of liver cancer, presents for a follow-up visit. presents for a follow-up after a recent procedure. She reports no post-procedural discomfort, denying any nausea, vomiting, or bleeding. Bowel movements and appetite have been normal.    The patient also reports a recent episode of coughing, for which she sought medical attention. The patient denies any associated symptoms such as shortness of breath, fever, or chills.  She went to the emergency room and was found to have a shot of the left upper lobe they assumed that that was her area of treated lung cancer.    In the context of her liver disease, the patient underwent a scan to assess the status of her condition. The patient has a history of multiple treatments for her liver condition, including microwave ablation and bead embolization. The patient's liver has tolerated these treatments well, with no reported complications or adverse effects.    The patient's overall health status appears stable, with no new or worsening symptoms reported. The patient's recent cough episode has resolved following medical intervention.    .     Review of Systems   Constitutional:  Negative for fatigue, fever and unexpected weight change.   HENT:  Negative for facial swelling, mouth sores, sore throat, tinnitus, trouble swallowing and voice change.    Respiratory:  Negative for cough, choking, chest tightness, shortness of breath and wheezing.    Cardiovascular:  Negative for chest pain, palpitations and leg swelling.   Gastrointestinal:  Negative for abdominal distention, abdominal pain, anal bleeding, blood in stool, constipation, diarrhea, nausea, rectal pain and vomiting.   Genitourinary:  Negative for difficulty urinating, dysuria, frequency and hematuria.   Musculoskeletal:  Negative for arthralgias, back pain, gait problem, joint swelling, myalgias and neck pain.   Skin:  Negative for pallor, rash and wound.   Neurological:  Negative for dizziness, seizures, syncope, speech difficulty, weakness, light-headedness, numbness and headaches.   Hematological:  Negative for adenopathy. Does not bruise/bleed easily.   Psychiatric/Behavioral:  Negative for dysphoric mood and sleep disturbance. The patient is not nervous/anxious.          Objective:          azaTHIOprine (IMURAN) 50 mg tablet Take one tablet by mouth at bedtime daily.    calcium citrate/vitamin D3 (CALCIUM CITRATE + D PO) Take 1 tablet by mouth twice daily.    furosemide (LASIX) 20 mg tablet TAKE 1 TABLET BY MOUTH ONCE DAILY IN THE MORNING    lactulose (CONSTULOSE) 10 gram/15 mL oral solution TAKE 30 ML BY MOUTH TWO  TO THREE TIMES DAILY (TITRATES TO 2-3 BOWEL MOVEMENTS DAILY)    omeprazole DR (PRILOSEC) 20 mg capsule Take one capsule by mouth daily before breakfast.    ondansetron HCL (ZOFRAN) 4 mg tablet Take one tablet by mouth every 8 hours as needed for Nausea or Vomiting.    ondansetron HCL (ZOFRAN) 4 mg tablet Take one tablet by mouth every 6 hours as needed for Nausea or Vomiting.    ondansetron HCL (ZOFRAN) 8 mg tablet Take one tablet by mouth every 8 hours as needed (nausea and vomiting).    oxyCODONE (ROXICODONE) 5 mg tablet Take one tablet by mouth every 6 hours as needed for Pain.    oxyCODONE (ROXICODONE) 5 mg tablet Take one tablet by mouth every 6 hours as needed for Pain. oxyCODONE-acetaminophen (PERCOCET) 7.5-325 mg tablet Take one tablet by mouth every 12 hours as needed for Pain.    rifAXIMin (XIFAXAN) 550 mg tablet Take one tablet by mouth every 12 hours.    spironolactone (ALDACTONE) 50 mg tablet Take one tablet by mouth daily with breakfast. Take with food.     Vitals:    11/29/22 1052   BP: 107/73   BP Source: Arm, Left Upper   Pulse: 83   Temp: 36.6 ?C (97.9 ?F)   Resp: 16   SpO2: 95%   O2 Device: None (Room air)   TempSrc: Temporal   PainSc: Zero   Weight: 73.9 kg (163 lb)     Body mass index is 26.31 kg/m?Marland Kitchen     Pain Score: Zero       Fatigue Scale: 0-None    Pain Addressed:  N/A    Patient Evaluated for a Clinical Trial: No treatment clinical trial available for this patient.     [place] Cooperative Oncology Group performance status is 1, Restricted in physically strenuous activity but ambulatory and able to carry out work of a light or sedentary nature, e.g., light house work, office work.     Physical Exam  Constitutional:       General: She is not in acute distress.     Appearance: Normal appearance. She is well-developed.   HENT:      Head: Normocephalic and atraumatic.   Eyes:      General: No scleral icterus.        Right eye: No discharge.      Pupils: Pupils are equal, round, and reactive to light.   Neck:      Thyroid: No thyromegaly.   Cardiovascular:      Rate and Rhythm: Normal rate and regular rhythm.      Heart sounds: Normal heart sounds. No murmur heard.     No friction rub.   Pulmonary:      Effort: No respiratory distress.      Breath sounds: Normal breath sounds. No wheezing or rales.   Abdominal:      General: Bowel sounds are normal. There is no distension.      Palpations: Abdomen is soft. There is no hepatomegaly, splenomegaly or mass.      Tenderness: There is no abdominal tenderness. There is no guarding or rebound.      Hernia: No hernia is present.   Musculoskeletal:         General: No tenderness.      Cervical back: Full passive range of motion without pain, normal range of motion and neck supple.      Comments: Right lower limb amputation   Lymphadenopathy:  Head:      Right side of head: No submental, submandibular, tonsillar, preauricular, posterior auricular or occipital adenopathy.      Left side of head: No submental, submandibular, tonsillar, preauricular, posterior auricular or occipital adenopathy.      Cervical: No cervical adenopathy.      Upper Body:      Right upper body: No supraclavicular adenopathy.      Left upper body: No supraclavicular adenopathy.   Skin:     General: Skin is warm.      Findings: No petechiae or rash. Rash is not purpuric.   Neurological:      Mental Status: She is alert and oriented to person, place, and time.      Cranial Nerves: No cranial nerve deficit.      Sensory: No sensory deficit.      Coordination: Coordination normal.      Deep Tendon Reflexes: Reflexes are normal and symmetric.   Psychiatric:         Mood and Affect: Mood is not anxious or depressed.         Behavior: Behavior normal.         Thought Content: Thought content normal.         Judgment: Judgment normal.                Assessment and Plan:  Problem List          Oncology    Hepatocellular carcinoma [facility])    Overview          She was admitted to the hospital in 02/2017 with decompensated liver failure. Her MELD at that time was 27. She is currently still on prednisone taper with plans to start azathioprine soon           03/14/17 - AFP = 22.4 and 05/09/17 = 8.3     03/15/17 - MRI ABD impression Redemonstration of 1.7 cm LI RADS 3 observation in segment 5/6 of the liver with arterial enhancement. No appreciable washout or additional significant signal abnormality.  Heterogeneous perfusion pattern throughout the liver without additional discrete mass. Follow-up CT or MRI abdomen (liver protocol) in 3 months recommended. Cirrhosis with small portosystemic varices. No splenomegaly or ascites.       03/15/17 Liver biopsy  SURG PATH #: [id number]  A.  Chronic hepatitis with moderate to severe interface and lobular activity with lymphocytic and lympho- plasmacytic infiltrates in the portal tracts and extending into the lobule and areas of confluent hepatocyte necrosis/collapse, compatible with     autoimmune hepatitis.     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   See comment.      03/29/17 - Liver biopsy  SURG PATH #: [id number]  A. Liver, mass, biopsy:   Negative for mass/lesion.     4//8/19 Liver biopsy SURG PATH #: [id number] A. Liver mass, biopsy: Well differentiated hepatocellular carcinoma.          05/30/17 mri pelvis IMPRESSION  Three soft tissue nodules within the right pelvis that are unchanged since February 2019.  These most likely represent metastases from the reported sarcoma.  Consider biopsy of the largest nodule, as clinically indicated.       05/31/17 US Guided bx of the target right gluteal soft tissue nodule   Successful ultrasound guided biopsy of the target right gluteal soft tissue mass.  Four core biopsy specimens submitted to pathology in formalin     05/31/17 PATHOLOGY SURG PATH #: [id number] Diagnosis:  A. Nerve bundles, pelvic lymph node biopsy, core needle biopsy:   Abundant nerve bundles. See comment.        She is to also have MWA along with TACE for LI-Rads 5 lesion -   08/22/17 Successful CT -guided Microwave Ablation of hepatic segment 5/6 lesion  02/07/19 Successful CT -guided Microwave Ablation of segment 6 liver tumor.  10/11/2022 Successful chemoembolization of hepatic tumors as described using approximately 39 mg of Doxorubicin.                               1. Hepatocellular carcinoma biopsy proven.   - Status post her second chemoembolization tolerated that very well.    - We reviewed her labs and tumor marker which are unremarkable. AFP is 2.1.  - Reviewed her CT AP which is negative, except for stable cirrhosis, no ascites.   - Had EGD on 05/23/2019 which showed esophageal varices, small and non-bleeding, so no intervention needed.    Danaisa is currently on surveillance.  Status post liver directed therapy tolerated very well unfortunately her cancer markers came back elevated we reviewed the images in clinic official report was still pending at the time of her visit.  There is an area of nodularity in the right lobe of the liver that had persisted even after liver directed therapy there is a new area in the dome of the liver and another enhancing area in segment 6.  The official report came back confirming persistent enhancement in segment 5 of the segment 1 lesion that was treated with micro ablation is nonviable.  With her rising tumor markers she may be a good candidate for yttrium-90 radioembolization.  With her immune hepatitis she would be a candidate for immunotherapy and the next best option would be Lenvatinib.  Will discuss her case at our multidisciplinary liver tumor and get back to her with the next steps.  Plan:    -Discuss case in tumor board on 12/04/2022.  -Follow-up in 2 months with   CT of the abdomen with and without contrast CBC CMP alpha-fetoprotein INR.      2. Abnormal lung nodule of the left upper lobe suspicious for primary neoplasm of the lung .  Squamous cell carcinoma of the lung following with thoracic oncology Dr. 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  With recurrence of her right left rib status post SBRT looking stable.        3. 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     4. Autoimmune hepatitis   - Following up with Dr. Ladona Ridgel well compensated.                     Total of 40 minutes were spent on the same day of the visit including preparing to see the patient, obtaining and/or reviewing separately obtained history, performing a medically appropriate examination and/or evaluation, counseling and educating the patient/family/caregiver, ordering medications, tests, or procedures, referring and communication with other health care professionals, documenting clinical information in the electronic or other health record, independently interpreting results and communicating  results to the patient/family/caregiver, and care coordination.

## 2022-12-04 ENCOUNTER — Encounter: Admit: 2022-12-04 | Discharge: 2022-12-04 | Payer: MEDICARE

## 2022-12-07 ENCOUNTER — Encounter: Admit: 2022-12-07 | Discharge: 2022-12-07 | Payer: MEDICARE

## 2022-12-07 DIAGNOSIS — C220 Liver cell carcinoma: Secondary | ICD-10-CM

## 2022-12-07 MED ORDER — LENVATINIB 12 MG/DAY (4 MG X 3) PO CAP
12 mg | ORAL_CAPSULE | Freq: Every day | ORAL | 3 refills | Status: AC
Start: 2022-12-07 — End: ?

## 2022-12-07 MED ORDER — ONDANSETRON HCL 8 MG PO TAB
ORAL_TABLET | ORAL | 3 refills | 8.00000 days | Status: AC
Start: 2022-12-07 — End: ?
  Filled 2022-12-08: qty 40, 40d supply, fill #1

## 2022-12-08 ENCOUNTER — Encounter: Admit: 2022-12-08 | Discharge: 2022-12-08 | Payer: MEDICARE

## 2022-12-11 ENCOUNTER — Encounter: Admit: 2022-12-11 | Discharge: 2022-12-11 | Payer: MEDICARE

## 2022-12-12 ENCOUNTER — Encounter: Admit: 2022-12-12 | Discharge: 2022-12-12 | Payer: MEDICARE

## 2022-12-12 NOTE — Progress Notes
Specialty Medication Discontinuation of Treatment and Patient Management Program Participation    Medication name: LENVATINIB PO  Reason: [facility]    Spoke with patient and the patient is no longer taking their medication due to patient preference. decided not to start it.. Thus, the patient will be removed from the specialty pharmacy patient management program.    The patient stated their provider's office is aware they are no longer taking the medication.    The medication has been removed from the patient's active medication list. Should the patient's care team wish to restart the medication, a new prescription will need to be sent to [facility].    The patient is aware.    Spoke with patient. The patient's questions and concerns were addressed as needed. The patient was instructed to contact their healthcare provider if their symptoms or health problems require medical attention. For clinical questions about the medication, the pharmacist can be reached at [phone].    Waynetta Pean, PHARMD

## 2022-12-18 ENCOUNTER — Encounter: Admit: 2022-12-18 | Discharge: 2022-12-18 | Payer: MEDICARE

## 2022-12-18 DIAGNOSIS — C220 Liver cell carcinoma: Secondary | ICD-10-CM

## 2022-12-18 NOTE — Progress Notes
Patient scheduled for Y90 mapping tomorrow in IR. Pre procedure instructions sent to patient's My Chart as follows:    Dear Sarah Pineda,     Thank you for choosing [facility] Interventional Radiology for your procedure. Your appointment information is listed below:     Appointment Date: Tuesday, December 19, 2022  Appointment:  11:00AM  Check In:  10:00AM  Location: Main Campus: [address] [place], [place]  [address]  Parking: P3 Parking Garage     Directions to Interventional Radiology [facility] - Walk into the front door of the hospital. Follow the hallway until you get to the six pack of elevators.  Take the elevators up to the second floor.  Follow the signs on the ceiling to Interventional Radiology.  Depending what elevator you are on, you will go down the hallway, past surgery waiting room (towards the window) and Interventional Radiology will be at the end of the hallway on the right hand side. This is where you will check in for your procedure.      INTERVENTIONAL RADIOLOGY  PRE-PROCEDURE INSTRUCTIONS SEDATION     You are scheduled for a Y90 Mapping  procedure with Dr. Thomasena Edis in Interventional Radiology with procedural sedation.  Please follow these instructions and any direction from your Primary Care/Managing Physician.  If you have questions about your procedure or need to reschedule please call Lanora Manis, RN @ [phone] or send My Chart message.    Sirspheres Mapping    This is the Y90 work-up/planning session. This is a day procedure in the Interventional Radiology department. You will have a liver arteriogram to work out the road-map, to block-off potential escape pathways to gut vessels, and to calculate the correct Y90 dose for you.  The Interventional Radiologist will place a catheter (small tube) into an artery in the groin, under local anesthetic.  You will also be given IV conscious sedation to keep you comfortable during your procedure.  The catheter will be advanced inside the artery up into the liver. Detailed angiograms (road-maps) of the liver are obtained. Unwanted non-target arteries branching towards the stomach and duodenum are embolized (blocked off) with tiny metallic coils. This is done to prevent radioactive particles damaging the stomach and duodenum. Then you will receive an injection of 31mTc-MAA (a dummy radioactive tracer) into the catheter, to work out shunting to the lungs.  When the catheter is removed from your groin, a closure device will be placed in the femoral artery and you will go back to recovery. For approximately 2 hours, you will be asked to lie flat to avoid bleeding from the groin.  During this time, you will go to the nuclear department for a scan that will take some pictures of your chest and abdomen with a gamma camera, which can measure the distribution of the dummy radioactive tracer.      You can go to [email or web address].com for additional information about treatment with Sirspheres                      Medication Sig Dispense Refill    aspirin EC 81 mg tablet Take one tablet by mouth daily after lunch. Take with food.   As of 08/08/21: ON HOLD due to all procedures/appointments. Last dose > 1 month ago. Pt to check in with Dr. Ladona Ridgel about resumption and when/if appropriate to start again.        azaTHIOprine (IMURAN) 50 mg tablet Take one tablet by mouth  at bedtime daily. 90 tablet 3    calcium citrate/vitamin D3 (CALCIUM CITRATE + D PO) Take 1 tablet by mouth twice daily.        CONSTULOSE 10 gram/15 mL oral solution TAKE 30 ML BY MOUTH TWO TO THREE TIMES DAILY (TITRATES TO 2 - 3 BOWEL MOVEMENTS DAILY) 1896 mL 3    furosemide (LASIX) 20 mg tablet Take one tablet by mouth every morning. 90 tablet 3    omeprazole DR (PRILOSEC) 20 mg capsule Take one capsule by mouth daily before breakfast. 90 capsule 3    ondansetron HCL (ZOFRAN) 4 mg tablet Take one tablet by mouth every 6 hours as needed for Nausea or Vomiting. 30 tablet 0 ondansetron HCL (ZOFRAN) 8 mg tablet Take one tablet by mouth every 8 hours as needed (nausea and vomiting). 30 tablet 3    oxyCODONE (ROXICODONE) 5 mg tablet Take one tablet by mouth every 6 hours as needed for Pain. 20 tablet 0    oxyCODONE-acetaminophen (PERCOCET) 7.5-325 mg tablet Take one tablet by mouth every 12 hours as needed for Pain.        rifAXIMin (XIFAXAN) 550 mg tablet Take one tablet by mouth every 12 hours. 60 tablet 11    spironolactone (ALDACTONE) 50 mg tablet Take one tablet by mouth daily with breakfast. Take with food. 90 tablet 3      No current facility-administered medications on file prior to visit.         Medication Instructions:   You may take the following medications with a small sip of water:    Omeprazole  Pain or nausea meds if needed           Do not take the following medications:   Spironolactone  Xifaximin  Furosemide  Constulose  Aspirin  Vitamins/minerals     Diet Instructions:  (8) hours before your procedure03:00AM), stop your regular diet and start a clear liquid diet.  (6) hours before your procedure, discontinue tube feedings and chewing tobacco.  (2) hours before your procedure (09:00AM) discontinue clear liquids.  You should have nothing by mouth. This includes GUM or CANDY.         Clear Liquid Diet:     Water                   Apple or White Grape Juice          Coffee or tea without cream   Tea                      White Cranberry Juice                   Chicken Bouillon or Broth (no noodles)  Soda Pop             Popsicles                                       Beef Bouillon or Broth (no noodles)     Day of Exam Instructions:  Bathe or shower with an antibacterial soap prior to your appointment.  If you have a history of Obstructive Sleep Apnea (OSA) bring your CPAP/BIPAP.   Bring a list of your current medications and the dosages.  Wear comfortable clothing and leave valuables at home.  Arrive 1 hour prior to your appointment.  This time will be spent registering,  interviewing, assessing, educating and preparing you for the test.        You will be with [place] anywhere from 30 minutes to 6 hours after your exam depending on your procedure.  You may be sedated for the procedure. A responsible adult must drive you home (no Benedetto Goad, taxis or buses are allowed) and stay with you overnight. If you do not have a driver we will be unable to perform your procedure.   You will not be able to return to work or drive the same day if receiving sedation.  You may be discharged home with post procedure prescriptions.  Please let your nurse coordinator know if you would like the scripts (if needed) sent to your local pharmacy or to the [facility] pharmacy located on the ground floor of the hospital.       We look forward to seeing you and thank you for allowing [place] to participate in your care.  Please call or send My Chart message with questions or concerns prior to your procedure.  [phone].

## 2022-12-18 NOTE — Telephone Encounter
Reminder call made to patient's daughter Elmarie Shiley, for y90 mapping procedure scheduled tomorrow in [place].  LVM along with name and call back phone number.    Reminder call also made to patient's mobile phone.  LVM along with name and call back phone number.

## 2022-12-19 ENCOUNTER — Encounter: Admit: 2022-12-19 | Discharge: 2022-12-19 | Payer: MEDICARE

## 2022-12-19 ENCOUNTER — Ambulatory Visit: Admit: 2022-12-19 | Discharge: 2022-12-19 | Payer: MEDICARE

## 2022-12-19 ENCOUNTER — Ambulatory Visit: Admit: 2022-12-19 | Discharge: 2022-12-20 | Payer: MEDICARE

## 2022-12-20 ENCOUNTER — Encounter: Admit: 2022-12-20 | Discharge: 2022-12-20 | Payer: MEDICARE

## 2022-12-20 DIAGNOSIS — C220 Liver cell carcinoma: Secondary | ICD-10-CM

## 2022-12-20 NOTE — Telephone Encounter
Phone call made to patient's daughter to see how pt was doing after y90 mapping and to let her know pre procedure instructions were sent to her My Chart and also made note of the location which is [facility]. Informed her lab orders were sent electronically to [name] in [place] with instructions to have patient get after December 1st but in time for [place] to receive the results prior to her procedure. Provided name and call back phone number for this CNC.

## 2022-12-20 NOTE — Progress Notes
Patient scheduled for Sirspheres Treatment 12/9 9 AM [facility].  Pre procedure instructions sent to patient as follows:    Dear Sarah Pineda,    Thank you for choosing [facility] Interventional Radiology for your procedure. Your appointment information is listed below:    Appointment Date: 01/01/23  Appointment Time: 09:00AM  Arrival Time: 08:00AM  Location: [facility]: [address], [place], [place] [address]   Parking: available in the front of the building        INTERVENTIONAL RADIOLOGY  PRE-PROCEDURE INSTRUCTIONS SEDATION    You are scheduled for a Sirspheres  procedure in Interventional Radiology with Dr. Thomasena Edis.  Please follow these instructions and any direction from your Primary Care/Managing Physician.  If you have questions about your procedure or need to reschedule please call Lanora Manis, RN @ [phone] or send My Chart message.    Radioembolization with Sirspheres is a minimally invasive procedure that combines embolization and radiation therapy to treat liver cancer. Tiny glass or resin beads filled with the radioactive isotope yttrium Y-90 are placed inside the blood vessels that feed a tumor. This blocks the supply of blood to the cancer cells and delivers a high dose of radiation to the tumor while sparing normal tissue. It can help extend the lives of patients with inoperable tumors and improve their quality of life.    For more information:   [email or web address].      Current Outpatient Medications on File Prior to Visit   Medication Sig Dispense Refill    azaTHIOprine (IMURAN) 50 mg tablet Take one tablet by mouth at bedtime daily. 90 tablet 3    calcium citrate/vitamin D3 (CALCIUM CITRATE + D PO) Take 1 tablet by mouth twice daily.      furosemide (LASIX) 20 mg tablet TAKE 1 TABLET BY MOUTH ONCE DAILY IN THE MORNING 90 tablet 0    lactulose (CONSTULOSE) 10 gram/15 mL oral solution TAKE 30 ML BY MOUTH TWO TO THREE TIMES DAILY (TITRATES TO 2-3 BOWEL MOVEMENTS DAILY) 1800 mL 3 omeprazole DR (PRILOSEC) 20 mg capsule Take one capsule by mouth daily before breakfast. 90 capsule 3    ondansetron HCL (ZOFRAN) 8 mg tablet Take 1 tablet (8 mg) by mouth 30 minutes prior to each dose of Lenvatinib. 40 tablet 3    oxyCODONE (ROXICODONE) 5 mg tablet Take one tablet by mouth every 6 hours as needed for Pain. 20 tablet 0    oxyCODONE (ROXICODONE) 5 mg tablet Take one tablet by mouth every 6 hours as needed for Pain. 20 tablet 0    oxyCODONE-acetaminophen (PERCOCET) 7.5-325 mg tablet Take one tablet by mouth every 12 hours as needed for Pain.      rifAXIMin (XIFAXAN) 550 mg tablet Take one tablet by mouth every 12 hours. 60 tablet 11    spironolactone (ALDACTONE) 50 mg tablet Take one tablet by mouth daily with breakfast. Take with food. 90 tablet 3     No current facility-administered medications on file prior to visit.      Medication Instructions:   You may take the following medications with a small sip of water:    Oxycodone (if needed)  Zofran (if needed)  Prilosec    Do not take the following medications:   Spironolactone  Xifaxin  Lactulose  Furosemide (Lasix)  Vitamins/Minerals  Aspirin      Diet Instructions:  (8) hours before your procedure (midnight), stop your regular diet and start a clear liquid diet.  (6) hours before  your procedure, discontinue tube feedings and chewing tobacco.  (2) hours before your procedure (07:00AM) discontinue clear liquids.  You should have nothing by mouth. This includes GUM or CANDY.       Clear Liquid Diet:    Water    Apple or White Grape Juice    Coffee or tea without cream   Tea    White Cranberry Juice    Chicken Bouillon or Broth (no noodles)  Soda Pop    Popsicles     Beef Bouillon or Broth (no noodles)    Day of Exam Instructions:  Bathe or shower with an antibacterial soap prior to your appointment.  If you have a history of Obstructive Sleep Apnea (OSA) bring your CPAP/BIPAP.   Bring a list of your current medications and the dosages.  Wear comfortable clothing and leave valuables at home.  Arrive as instructe above.  This time will be spent registering, interviewing, assessing, educating and preparing you for the test.        You will be with [place] anywhere from 30 minutes to 6 hours after your exam depending on your procedure.  You may be sedated for the procedure. A responsible adult must drive you home (no Benedetto Goad, taxis or buses are allowed) and stay with you overnight. If you do not have a driver we will be unable to perform your procedure.   You will not be able to return to work or drive the same day if receiving sedation.  You may be discharged home with post procedure prescriptions.  Please let your nurse coordinator know if you would like the scripts (if needed) sent to your local pharmacy or to the [facility] pharmacy located on the ground floor of the hospital.      We look forward to seeing you and thank you for allowing [place] to participate in your care.  Please call or send My Chart message with questions or concerns prior to your procedure.  [phone].     Discharge Instructions  Theraspheres/Sirspheres Treatment Radioembolization     POST-PROCEDURE ACTIVITY:  A responsible adult must drive you home after the procedure.    If you receive sedation or anesthesia, do not drive, operate heavy machinery or do anything that requires concentration for at least 24 hours.   It is recommended that a responsible adult be with you until morning.   Avoid any exertion for one week.  Exertion is lifting over 10 lbs., pushing, pulling or straining.   Avoid excessive bending, stooping, or stair climbing for 2 days.  It is okay to go up stairs or bend over but take it slowly and keep it to a minimum.  You may be up and about while relaxing at home as you recover.  POST-PROCEDURE SITE CARE:  You will have a bandage over the site.  Keep this dry.  You may remove it in 24 hours.   You may shower in 24 hours after removing the bandage.  Wash and dry the site gently.  Do not submerge the site underwater for one week (no tub bath, swimming, hot tub, etc.)  Do not use ointments, creams or powders on the puncture site.  Be sure your hands are clean when touching near the site.  Inspect the site daily.   DIET/MEDICATIONS:  Bonita Quin may resume your previous diet after the procedure.  If you receive sedation or narcotic pain medications, avoid any foods or beverages containing alcohol for at least 24 hours.  Please see  the Medication Reconciliation sheet for instructions regarding resuming your home medications.  WHEN TO CALL THE DOCTOR:  If you have significant bleeding (more than a teaspoon) or swelling at the site (bigger than a golf ball), lie down, apply firm pressure to the site and call 911. Bleeding from a large vessel requires professional help.  If you have signs of infection such as:  Chills, body aches, fever greater than 100.5?F.  Redness, swelling or warmth at the puncture site.  If you have soreness that continues for more than a week or unusual pain at the puncture site.  It is common to have mild soreness or slight swelling at the site for up to 2 weeks.  If you have numbness, tingling, weakness in the leg below the puncture site or your leg becomes cold and pale.     For any of the above symptoms or for problems or concerns related to the procedure,  call [phone] for Monday-Friday 7-5.  After-hours and weekends, please call  [phone] and ask for the Interventional Radiology Resident on-call.

## 2022-12-25 ENCOUNTER — Encounter: Admit: 2022-12-25 | Discharge: 2022-12-25 | Payer: MEDICARE

## 2022-12-25 DIAGNOSIS — C220 Liver cell carcinoma: Secondary | ICD-10-CM

## 2022-12-25 NOTE — Telephone Encounter
Called [name], patient's daughter, about Dr Tamsen Meek wanting to see Sarah Pineda for a check in and to revisit lenvatinib. Offered 12/24 appt, but Elmarie Shiley would like a different day. Scheduled pt on 12/27 with labs and visit. [name] v/u.

## 2022-12-27 ENCOUNTER — Encounter: Admit: 2022-12-27 | Discharge: 2022-12-27 | Payer: MEDICARE

## 2022-12-27 NOTE — Telephone Encounter
Reminder call made to patient for Sirspheres treatment scheduled Monday, !03/03/22 @ [facility].  LVM along with call back phone number for this CNC.  Also received outside labs.  Results sent to be scanned to [facility].

## 2023-01-01 ENCOUNTER — Encounter: Admit: 2023-01-01 | Discharge: 2023-01-01 | Payer: MEDICARE

## 2023-01-01 ENCOUNTER — Ambulatory Visit: Admit: 2023-01-01 | Discharge: 2023-01-01 | Payer: MEDICARE

## 2023-01-01 MED ORDER — METHYLPREDNISOLONE 4 MG PO DSPK
ORAL_TABLET | 0 refills | Status: AC
Start: 2023-01-01 — End: ?
  Filled 2023-01-01: qty 21, 6d supply, fill #1

## 2023-01-09 ENCOUNTER — Encounter: Admit: 2023-01-09 | Discharge: 2023-01-09 | Payer: MEDICARE

## 2023-01-09 DIAGNOSIS — C220 Liver cell carcinoma: Secondary | ICD-10-CM

## 2023-01-11 ENCOUNTER — Encounter: Admit: 2023-01-11 | Discharge: 2023-01-11 | Payer: MEDICARE

## 2023-01-11 ENCOUNTER — Encounter: Admit: 2023-01-11 | Discharge: 2023-01-12 | Payer: MEDICARE

## 2023-01-12 ENCOUNTER — Encounter: Admit: 2023-01-12 | Discharge: 2023-01-13 | Payer: MEDICARE

## 2023-01-12 ENCOUNTER — Encounter: Admit: 2023-01-12 | Discharge: 2023-01-12 | Payer: MEDICARE

## 2023-01-12 NOTE — Telephone Encounter
Another attempt to reach patient's daughter secondary to message left about her mom breaking hip.

## 2023-01-18 ENCOUNTER — Encounter: Admit: 2023-01-18 | Discharge: 2023-01-18 | Payer: MEDICARE

## 2023-01-18 NOTE — Telephone Encounter
Pt's daughter, Elmarie Shiley returning call to [employer]. Transferred call to [facility].

## 2023-01-18 NOTE — Telephone Encounter
Daughter Sarah Pineda calling me back regarding GI procedure I notified [name] to call please call the Endoscopy Lab Scheduling team at [phone], option 4 she notified me that her mom is current in skilled nursing [facility] informed me that she went to the ER found possible mass fracture of the greater trochanter of the Left side she stated ER doctor said they had consulted with [name] but the daughter was unsure I informed daughter to call [name] office I will request ER records from [name] in [place] Daughter wanting to know if she still needs GI procedure at this time I informed her I would speak with [name] then i would call her back with plan of care

## 2023-01-18 NOTE — Telephone Encounter
see phone encounter

## 2023-01-19 ENCOUNTER — Encounter: Admit: 2023-01-19 | Discharge: 2023-01-19 | Payer: MEDICARE

## 2023-01-22 ENCOUNTER — Encounter: Admit: 2023-01-22 | Discharge: 2023-01-22 | Payer: MEDICARE

## 2023-01-23 ENCOUNTER — Encounter: Admit: 2023-01-23 | Discharge: 2023-01-23 | Payer: MEDICARE

## 2023-01-23 NOTE — Telephone Encounter
Spoke to Sarah Pineda's daughter regarding the lytic lesion in the scan recently done at her local hospital.  She had a intertrochanteric fracture which was not amenable for surgery.  My concern is this lytic lesion could potentially cause instability of her femur and would like her to see orthopedics.  Will get a bone scan to further evaluate other bony lesions.  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  I will get her set up with orthopedic oncology and get the bone scan she has an appointment with me January 8.  Will keep that appointment

## 2023-01-25 ENCOUNTER — Encounter: Admit: 2023-01-25 | Discharge: 2023-01-25 | Payer: MEDICARE

## 2023-01-30 ENCOUNTER — Encounter: Admit: 2023-01-30 | Discharge: 2023-01-30 | Payer: MEDICARE

## 2023-01-30 NOTE — Telephone Encounter
Phone call made to patient's daughter to see how patient is doing and to see if she is still in the nursing home. Patient's daughter said she is still in skilled nursing facility - [facility].  Patient is non weightbearing.  She is scheduled for a bone scan tomorrow at [facility] and will be transported to [place] from [facility].  Informed patient's daughter we will leave pt on the schedule until I hear back from [name] and Dr. Thomasena Edis about their recommendations and results of tomorrows bone scan.  This CNC to follow up with patient's daughter, Sarah Pineda once a decision has been made.

## 2023-01-31 ENCOUNTER — Encounter: Admit: 2023-01-31 | Discharge: 2023-01-31 | Payer: MEDICARE

## 2023-01-31 ENCOUNTER — Ambulatory Visit: Admit: 2023-01-31 | Discharge: 2023-01-31 | Payer: MEDICARE

## 2023-01-31 ENCOUNTER — Ambulatory Visit: Admit: 2023-01-31 | Discharge: 2023-02-01 | Payer: MEDICARE

## 2023-01-31 NOTE — Telephone Encounter
Call made to patient's daughter, Elmarie Shiley, to let her know the sirspheres procedure that was scheduled for 02/02/23 is cancelled for now after discussion with [name].  Pt had bone scan today and is scheduled for an appointment with [name] 1/10.  She thanked [place] for the call. Assured her we can get patient rescheduled if her treatment team decides she is ready.

## 2023-02-02 ENCOUNTER — Encounter: Admit: 2023-02-02 | Discharge: 2023-02-02 | Payer: MEDICARE

## 2023-02-02 DIAGNOSIS — K7581 Nonalcoholic steatohepatitis (NASH): Secondary | ICD-10-CM

## 2023-02-02 DIAGNOSIS — C3412 Malignant neoplasm of upper lobe, left bronchus or lung: Secondary | ICD-10-CM

## 2023-02-02 DIAGNOSIS — C220 Liver cell carcinoma: Secondary | ICD-10-CM

## 2023-02-02 NOTE — Progress Notes
Name: Sarah Pineda          MRN: [id number]      DOB: 05/20/1950      AGE: 73 y.o.   DATE OF SERVICE: 02/02/2023    Subjective:             Reason for Visit:  Follow Up      Sarah Pineda is a 73 y.o. female.      Cancer Staging   Hepatocellular carcinoma (HCC)  Staging form: Liver, AJCC 8th Edition  - Clinical: cT1, cN0, cM0 - Signed by Dayton Bailiff, MD on 06/06/2017    Non-small cell lung cancer (HCC)  Staging form: Lung, AJCC 8th Edition  - Pathologic: Stage IA2 (pT1b, pN0, cM0) - Signed by Dayton Bailiff, MD on 08/03/2017  - Pathologic: No stage assigned - Unsigned        History of Present Illness     Sarah Pineda known to have liver and lung cancer, presented with a new onset of hip pain following a minor fall. The pain was sudden and severe, described as a 'pop' in the hip area while turning on crutches. The pain was so intense that the patient was unable to bear weight on the affected leg. The patient was assisted out of the bathroom and into a chair, but the pain was so severe that she was unable to get out of the chair, prompting a call to emergency services.    The patient underwent a bone scan following the incident, which was a challenging experience due to the lack of a hand machine to assist with positioning. The patient reported ongoing discomfort in the hip area, which is being managed with physical therapy. The patient is now required to use a pole for support and has been practicing standing up from the wheelchair, with a current standing time of two minutes and five seconds.    The bone scan revealed more spots, including an increased uptake in the mid-right thoracic spine and the left humeral head, suggesting that one of the patient's known cancers is now spreading to the bones. The patient is aware of the severity of her condition and has expressed a desire for quality of life over aggressive treatment.    .     Review of Systems   Constitutional:  Negative for fatigue, fever and unexpected weight change.   HENT:  Negative for facial swelling, mouth sores, sore throat, tinnitus, trouble swallowing and voice change.    Respiratory:  Negative for cough, choking, chest tightness, shortness of breath and wheezing.    Cardiovascular:  Negative for chest pain, palpitations and leg swelling.   Gastrointestinal:  Negative for abdominal distention, abdominal pain, anal bleeding, blood in stool, constipation, diarrhea, nausea, rectal pain and vomiting.   Genitourinary:  Negative for difficulty urinating, dysuria, frequency and hematuria.   Musculoskeletal:  Negative for arthralgias, back pain, gait problem, joint swelling, myalgias and neck pain.   Skin:  Negative for pallor, rash and wound.   Neurological:  Negative for dizziness, seizures, syncope, speech difficulty, weakness, light-headedness, numbness and headaches.   Hematological:  Negative for adenopathy. Does not bruise/bleed easily.   Psychiatric/Behavioral:  Negative for dysphoric mood and sleep disturbance. The patient is not nervous/anxious.          Objective:          azaTHIOprine (IMURAN) 50 mg tablet Take one tablet by mouth at bedtime daily.    calcium citrate/vitamin D3 (CALCIUM CITRATE + D PO)  Take 1 tablet by mouth twice daily.    furosemide (LASIX) 20 mg tablet TAKE 1 TABLET BY MOUTH ONCE DAILY IN THE MORNING    lactulose (CONSTULOSE) 10 gram/15 mL oral solution TAKE 30 ML BY MOUTH TWO TO THREE TIMES DAILY (TITRATES TO 2-3 BOWEL MOVEMENTS DAILY)    methylPREDNIsolone (MEDROL (PAK)) 4 mg tablet Take medication as directed on package for 6 days. Take with food.    omeprazole DR (PRILOSEC) 20 mg capsule Take one capsule by mouth daily before breakfast.    ondansetron HCL (ZOFRAN) 8 mg tablet Take 1 tablet (8 mg) by mouth 30 minutes prior to each dose of Lenvatinib.    oxyCODONE (ROXICODONE) 5 mg tablet Take one tablet by mouth every 6 hours as needed for Pain.    oxyCODONE (ROXICODONE) 5 mg tablet Take one tablet by mouth every 6 hours as needed for Pain.    oxyCODONE-acetaminophen (PERCOCET) 7.5-325 mg tablet Take one tablet by mouth every 12 hours as needed for Pain.    rifAXIMin (XIFAXAN) 550 mg tablet Take one tablet by mouth every 12 hours.    spironolactone (ALDACTONE) 50 mg tablet Take one tablet by mouth daily with breakfast. Take with food.     Vitals:    02/02/23 1425   PainSc: Zero     There is no height or weight on file to calculate BMI.     Pain Score: Zero       Fatigue Scale: 0-None    Pain Addressed:  N/A    Patient Evaluated for a Clinical Trial: No treatment clinical trial available for this patient.     [place] Cooperative Oncology Group performance status is 1, Restricted in physically strenuous activity but ambulatory and able to carry out work of a light or sedentary nature, e.g., light house work, office work.     Physical Exam  Constitutional:       General: She is not in acute distress.     Appearance: Normal appearance. She is well-developed.   HENT:      Head: Normocephalic and atraumatic.   Eyes:      General: No scleral icterus.        Right eye: No discharge.      Pupils: Pupils are equal, round, and reactive to light.   Neck:      Thyroid: No thyromegaly.   Cardiovascular:      Rate and Rhythm: Normal rate and regular rhythm.      Heart sounds: Normal heart sounds. No murmur heard.     No friction rub.   Pulmonary:      Effort: No respiratory distress.      Breath sounds: Normal breath sounds. No wheezing or rales.   Abdominal:      General: Bowel sounds are normal. There is no distension.      Palpations: Abdomen is soft. There is no hepatomegaly, splenomegaly or mass.      Tenderness: There is no abdominal tenderness. There is no guarding or rebound.      Hernia: No hernia is present.   Musculoskeletal:         General: No tenderness.      Cervical back: Full passive range of motion without pain, normal range of motion and neck supple.      Comments: Right lower limb amputation   Lymphadenopathy:      Head: Right side of head: No submental, submandibular, tonsillar, preauricular, posterior auricular or occipital adenopathy.  Left side of head: No submental, submandibular, tonsillar, preauricular, posterior auricular or occipital adenopathy.      Cervical: No cervical adenopathy.      Upper Body:      Right upper body: No supraclavicular adenopathy.      Left upper body: No supraclavicular adenopathy.   Skin:     General: Skin is warm.      Findings: No petechiae or rash. Rash is not purpuric.   Neurological:      Mental Status: She is alert and oriented to person, place, and time.      Cranial Nerves: No cranial nerve deficit.      Sensory: No sensory deficit.      Coordination: Coordination normal.      Deep Tendon Reflexes: Reflexes are normal and symmetric.   Psychiatric:         Mood and Affect: Mood is not anxious or depressed.         Behavior: Behavior normal.         Thought Content: Thought content normal.         Judgment: Judgment normal.                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              Hepatocellular carcinoma (HCC)    Overview          She was admitted to the hospital in 02/2017 with decompensated liver failure. Her MELD at that time was 27. She is currently still on prednisone taper with plans to start azathioprine soon           03/14/17 - AFP = 22.4 and 05/09/17 = 8.3     03/15/17 - MRI ABD impression Redemonstration of 1.7 cm LI RADS 3 observation in segment 5/6 of the liver with arterial enhancement. No appreciable washout or additional significant signal abnormality.  Heterogeneous perfusion pattern throughout the liver without additional discrete mass. Follow-up CT or MRI abdomen (liver protocol) in 3 months recommended. Cirrhosis with small portosystemic varices. No splenomegaly or ascites.       03/15/17 Liver biopsy  SURG PATH #: [id number]  A.  Chronic hepatitis with moderate to severe interface and lobular activity with lymphocytic and lympho- plasmacytic infiltrates in the portal tracts and extending into the lobule and areas of confluent hepatocyte necrosis/collapse, compatible with     autoimmune hepatitis.     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   See comment.      03/29/17 - Liver biopsy  SURG PATH #: [id number]  A. Liver, mass, biopsy:   Negative for mass/lesion.     4//8/19 Liver biopsy SURG PATH #: [id number] A. Liver mass, biopsy: Well differentiated hepatocellular carcinoma.          05/30/17 mri pelvis IMPRESSION  Three soft tissue nodules within the right pelvis that are unchanged since February 2019.  These most likely represent metastases from the reported sarcoma.  Consider biopsy of the largest nodule, as clinically indicated.       05/31/17 US Guided bx of the target right gluteal soft tissue nodule   Successful ultrasound guided biopsy of  the target right gluteal soft tissue mass.  Four core biopsy specimens submitted to pathology in formalin     05/31/17 PATHOLOGY SURG PATH #: [id number] Diagnosis:  A. Nerve bundles, pelvic lymph node biopsy, core needle biopsy:   Abundant nerve bundles. See comment.        She is to also have MWA along with TACE for LI-Rads 5 lesion -   08/22/17 Successful CT -guided Microwave Ablation of hepatic segment 5/6 lesion  02/07/19 Successful CT -guided Microwave Ablation of segment 6 liver tumor.  10/11/2022 Successful chemoembolization of hepatic tumors as described using approximately 39 mg of Doxorubicin.   01/02/2023   Successful radioactive embolization of hepatic tumor utilizing Y90 Sirtex beads                 Other    Liver cirrhosis secondary to NASH (HCC)                         1. Hepatocellular carcinoma biopsy proven.   - Status post her second chemoembolization tolerated that very well.    - We reviewed her labs and tumor marker which are unremarkable. AFP is 2.1.  - Reviewed her CT AP which is negative, except for stable cirrhosis, no ascites.   - Had EGD on 05/23/2019 which showed esophageal varices, small and non-bleeding, so no intervention needed.    Sarah Pineda is currently on surveillance.  Unfortunately looks like her disease is progressed significantly her alpha-fetoprotein is up to 15,000.  She had Y90 treatment which she tolerated pretty well but developed greater trochanteric pathological fracture her bone scan shows lesions involving the left humerus and thoracic spine and I suspect that this may be related to her hepatocellular carcinoma but could also be related to her previous history of lung cancer so we need to get a biopsy to confirm the etiology if this is metastatic hepatocellular carcinoma and she needs to be on Lenvima.  We talked about the dosing and scheduling of Lenvima and the side effects and the patient is agreeable to proceed at this time.  I would like to wait and see what the biopsy shows before we start.  Explained that Lenvima may slow down the healing from her intertrochanteric fracture but if we do not start systemic therapy soon she could potentially start developing more disease she also need to be considered for bisphosphonates in the future for to decrease skeletal related events    I had a follow-up discussion with the patient regarding their diagnosis of hepatocellular carcinoma and his or her recommended treatment via telephone.  I have recommended treatment with the following chemotherapy/biotherapy drugs: Lenvatinib. The goals and duration of treatment have been discussed in detail.  I explained alternative ways of treating the condition, including no treatment, and the risks and benefits of the alternatives. I offered the patient a chance to ask questions.  Any questions asked have been answered.  Norwood Levo RN witnessed the telephone conversation.  The patient has agreed to the recommended treatment.  The patient also understands they will need to sign a form documenting our conversation and his or her agreement the next time they are in clinic.   Plan:  -Plan for biopsy of left humeral head to determine the origin of metastasis.  -Consider starting Lenvatinib (pending biopsy results and healing of the fracture).  -Administer Zometa to prevent further fractures.      2. Abnormal lung nodule of the left upper lobe  suspicious for primary neoplasm of the lung .  Squamous cell carcinoma of the lung following with thoracic oncology Dr. 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  With recurrence of her right left rib status post SBRT looking stable.        3. 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     4. Autoimmune hepatitis   - Following up with Dr. Ladona Ridgel well compensated.    Follow-up Plans  -Schedule follow-up appointment on 02/14/2023.  -Review bone scan with Dr. Opal Sidles.  -Check with skilled nursing facility regarding administration of Lenvatinib.                 Total of 40 minutes were spent on the same day of the visit including preparing to see the patient, obtaining and/or reviewing separately obtained history, performing a medically appropriate examination and/or evaluation, counseling and educating the patient/family/caregiver, ordering medications, tests, or procedures, referring and communication with other health care professionals, documenting clinical information in the electronic or other health record, independently interpreting results and communicating results to the patient/family/caregiver, and care coordination.

## 2023-02-05 ENCOUNTER — Encounter: Admit: 2023-02-05 | Discharge: 2023-02-05 | Payer: MEDICARE

## 2023-02-05 NOTE — Progress Notes
Interventional Radiology Outpatient Procedure Review Assessment        Reason for consult: Evaluate for left greater trochanter biopsy  in setting of metastatic HCC with osseous metastatic disease.      History of present illness:  Sarah Pineda is a 73 y.o. female patient with a history significant for [facility] and primary lung cancer with concern for pathologic fx of left greater trochanter.      Assessment:   - Review of imaging: NM Scan 01/31/23.   - Review of recent labs and allergies: n/a.   - Review of anticoagulation medications: n/a  - Previous ASA score if available: 3 (If ASA IV, anesthesia will run sedation for procedure if sedation indicated).  - Previous IR Anesthetic/Sedation History (if applicable): moderate sedation  - Case discussed with referring Physician (Y/N): no  - Contraindications to procedure: none      Plan:  - This case has been reviewed and approved for CT guided bone biopsy of left greater trochanter  - Patient position and modality: Supine, CT  - Intra-procedural Sedation/Medication Plan: Moderate sedation  - Procedure time frame: <10 days, 10-14 days, 14-30 days  - Anticoagulation: Per IR peri procedural anticoagulation management protocol  - Labs: Per IR pre procedural lab protocol  - Additional requests after review: none    Lab/Radiology/Other Diagnostic Tests:  Labs:  Hematology:  Lab Results   Component Value Date    HGB 10.3 (L) 01/31/2023    HCT 30.7 (L) 01/31/2023    PLTCT 203 01/31/2023    WBC 6.90 01/31/2023    NEUT 92 (H) 01/31/2023    ANC 6.40 01/31/2023    LYMPH 25.7 06/04/2017    ALC 0.10 (L) 01/31/2023    AMC 0.40 01/31/2023    ABC 0.00 01/31/2023    BASOPHILS 0.8 06/04/2017    AEC 0.00 01/31/2023    RBC 3.34 (L) 01/31/2023    MCV 92.1 01/31/2023    MCH 31.0 01/31/2023    MCHC 33.7 01/31/2023    MPV 8.8 01/31/2023    RDW 16.2 (H) 01/31/2023   , Coagulation:  Lab Results   Component Value Date    PT 12.5 01/31/2023    INR 1.1 01/31/2023                Cecille Rubin, MD

## 2023-02-08 ENCOUNTER — Encounter: Admit: 2023-02-08 | Discharge: 2023-02-08 | Payer: MEDICARE

## 2023-02-08 NOTE — Progress Notes
Faxed CT orders to [facility] per pt daughter request

## 2023-02-09 ENCOUNTER — Encounter: Admit: 2023-02-09 | Discharge: 2023-02-09 | Payer: MEDICARE

## 2023-02-09 DIAGNOSIS — C220 Liver cell carcinoma: Secondary | ICD-10-CM

## 2023-02-09 NOTE — Telephone Encounter
Nurse Coordinator contacted patient's daughter Sarah Pineda regarding some concerns with nausea associated with Lactulose. Recommendations discussed and a mychart was sent.    Efraim Kaufmann, RN

## 2023-02-09 NOTE — Telephone Encounter
Called daughter after receiving message from [facility] hospital that for cancer they do CT CAP, or we will need to write a new order for the chest abdomen and pelvis to be wo/w.  I let daughter know these are not the scans we are wanting and it seems they do not do the scans we are ordering. Asked if pt could come to [place] to have done.  Daughter said [place] would be fine and they do not want this until beginning of Feb as her mom is still in skilled rehab and is difficult to transfer. Scheduling notified of scans now being at [facility]

## 2023-02-13 ENCOUNTER — Encounter: Admit: 2023-02-13 | Discharge: 2023-02-13 | Payer: MEDICARE

## 2023-02-14 ENCOUNTER — Encounter: Admit: 2023-02-14 | Discharge: 2023-02-14 | Payer: MEDICARE

## 2023-02-14 ENCOUNTER — Ambulatory Visit: Admit: 2023-02-14 | Discharge: 2023-02-14 | Payer: MEDICARE

## 2023-02-14 DIAGNOSIS — C220 Liver cell carcinoma: Secondary | ICD-10-CM

## 2023-02-14 NOTE — Progress Notes
*This content was generated with the assistance of artificial intelligence. While all notes are reviewed for relevance and accuracy before finalizing, please note that AI-generated content may omit or summarize parts of the discussion from today's visit.    Orthopaedic Surgery History and Physical    Referring Provider: Dawna Part    Date of Visit: 02/14/23  _______________________________________________    DIAGNOSIS:   HCC with bone mets    ASSESSMENT & PLAN:      Hepatocellular Carcinoma with Bone Metastasis  Recent diagnosis with pathologic fracture of the left greater trochanter and increased uptake in the left humeral head. No pain in the shoulder. Pain and inability to bear weight on the left hip.  -Obtain baseline x-ray of the left shoulder.  -Recommend biopsy of the left hip for definitive diagnosis.  -Plan for radiation therapy to the left hip once diagnosis is confirmed.  -Follow-up in three months with repeat imaging of the hip and possibly the shoulder.    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 No current issues related to this condition.  -No current plan needed.         ________________________________________________    HISTORY OF PRESENT ILLNESS: Sarah Pineda is a 73 y.o. female.     The patient is a 73 year old female with a known diagnosis of hepatocellular carcinoma with metastatic disease. Recent imaging studies, including a bone scan and CT scan, revealed increased uptake in the left humeral head and a destructive lesion in the left greater trochanter, respectively. The latter was associated with a pathologic fracture and mild superior displacement of the trochanter tip, but no tumor extension into the femoral neck was noted.    The patient reported a recent incident where she heard a 'pop' in her left hip while turning with her crutches after using the restroom. This was followed by significant pain and difficulty bearing weight on the affected side. The patient has been using crutches and an electric wheelchair at home due to a previous diagnosis of bone cancer (osteosarcoma) at the age of 47, which resulted in the loss of her right leg.    The patient denied experiencing any pain in her left shoulder, despite the noted increased uptake in the humeral head on the bone scan. The patient also has a history of liver cancer, which was previously treated with radiation beads on the right side. The treatment of the left side of the liver has been put on hold due to the recent developments.           PHYSICAL EXAM:  Vitals:    02/14/23 1014   BP: 119/59   Pulse: 96   Temp: 36.1 ?C (97 ?F)   Resp: 16   SpO2: 95%           No pain with AROM of the left shoulder  No TTP around the left shoulder  Exam deferred on left hip 2/2 known path fx of the GT               RADIOLOGY  Bone scan: Increased uptake in the left humeral head and left greater trochanter (01/31/2023)  CT scan of the pelvis: Destructive lesion within the proximal left greater trochanter with a pathologic fracture and mild superior displacement of the tip of the trochanter; no tumor extension into the femoral neck (01/11/2023)           IMAGING: All imaging discussed was independently reviewed and interpreted  by provider    PAST MEDICAL HISTORY:   Past Medical History:    Back pain    Cancer (HCC)    Diverticulosis    Emphysema lung (HCC)    GERD (gastroesophageal reflux disease)    Hepatitis    Hepatocellular carcinoma (HCC)    Immune disorder (HCC)    Non-small cell lung cancer (HCC)    Nonalcoholic steatohepatitis    Personal history of irradiation    Pulmonary nodule, left    Sarcoma (HCC)    Squamous cell cancer of lip    Unspecified deficiency anemia       PAST SURGICAL HISTORY:   Surgical History:   Procedure Laterality Date    AMPUTATION  1970    right leg-bone cancer    HX TUBAL LIGATION  1977    HX CHOLECYSTECTOMY  2004    ESOPHAGOGASTRODUODENOSCOPY WITH BIOPSY - FLEXIBLE N/A 05/18/2017 Performed by Virgina Organ, MD at [facility] ENDO    COLONOSCOPY DIAGNOSTIC WITH SPECIMEN COLLECTION BY BRUSHING/ WASHING - FLEXIBLE N/A 05/18/2017    Performed by Virgina Organ, MD at [facility] ENDO    LUNG SURGERY  07/24/2017    removal of lung wedge    left video assisted thoracoscopy with wedge resection Left 07/24/2017    Performed by Bryson Dames, MD at [facility] CVOR    ESOPHAGOGASTRODUODENOSCOPY WITH SPECIMEN COLLECTION BY BRUSHING/ WASHING N/A 05/23/2019    Performed by Dawna Part, MD at [facility] ENDO    BRONCHOSCOPY DIAGNOSTIC WITH/ WITHOUT CELL WASHING - FLEXIBLE N/A 12/05/2019    Performed by Boykin Nearing, Maykol R, MD at [facility] OR    BRONCHOSCOPY WITH IMAGE - GUIDED NAVIGATION - FLEXIBLE N/A 12/05/2019    Performed by Boykin Nearing, Maykol R, MD at [facility] OR    BRONCHOSCOPY WITH ENDOBRONCHIAL ULTRASOUND GUIDED TRANSTRACHEAL/ TRANSBRONCHIAL SAMPLING - 3 OR MORE MEDIASTINAL/ HILAR LYMPH NODE STATIONS/ STRUCTURE - FLEXIBLE N/A 12/05/2019    Performed by Boykin Nearing, Maykol R, MD at [facility] OR    BRONCHOSCOPY WITH TRANSBRONCHIAL LUNG BIOPSY - FLEXIBLE - SINGLE LOBE N/A 12/05/2019    Performed by Boykin Nearing, Maykol R, MD at [facility] OR    BRONCHOSCOPY WITH TRANSBRONCHIAL NEEDLE ASPIRATION AND BIOPSY TRACHEA/ MAIN STEM/ LOBAR BRONCHUS - FLEXIBLE N/A 12/05/2019    Performed by Boykin Nearing, Maykol R, MD at [facility] OR    BRONCHOSCOPY WITH BRUSHING/ PROTECTED BRUSHING - FLEXIBLE N/A 12/05/2019    Performed by Boykin Nearing, Maykol R, MD at [facility] OR    ESOPHAGOGASTRODUODENOSCOPY WITH SPECIMEN COLLECTION BY BRUSHING/ WASHING N/A 11/25/2021    Performed by Dawna Part, MD at [facility] ENDO    HX SALPINGO-OOPHORECTOMY      HX TONSILLECTOMY      LIVER BIOPSY      LIVER DONOR SURGERY      MWA 08/04/17, 02/07/19    LYMPH NODE BIOPSY      back    SKIN CANCER EXCISION      sqc- lip and nose       FAMILY HISTORY:   Family History   Problem Relation Name Age of Onset    Heart Failure Mother Jacquelynn Cree     Cancer Mother M  S         Stomach Heart Failure Father R. S         bypass    Heart Disease Father R. S     Stroke Brother S S         age 107  Cancer-Colon Brother S S     Aneurysm Brother S S     Diabetes Brother S S     Abnormal EKG Brother S S         Afib    Cancer Sister C. D         Pancreatic    Heart Disease Maternal Grandfather R  Y     Melanoma Neg Hx         SOCIAL HISTORY:  reports that she quit smoking about 5 years ago. Her smoking use included cigarettes. She started smoking about 55 years ago. She has a 25 pack-year smoking history. She has never used smokeless tobacco. She reports that she does not currently use alcohol. She reports that she does not use drugs.    MEDICATIONS:   Current Outpatient Medications:     azaTHIOprine (IMURAN) 50 mg tablet, Take one tablet by mouth at bedtime daily., Disp: 90 tablet, Rfl: 3    calcium citrate/vitamin D3 (CALCIUM CITRATE + D PO), Take 1 tablet by mouth twice daily., Disp: , Rfl:     furosemide (LASIX) 20 mg tablet, TAKE 1 TABLET BY MOUTH ONCE DAILY IN THE MORNING, Disp: 90 tablet, Rfl: 0    lactulose (CONSTULOSE) 10 gram/15 mL oral solution, TAKE 30 ML BY MOUTH TWO TO THREE TIMES DAILY (TITRATES TO 2-3 BOWEL MOVEMENTS DAILY), Disp: 1800 mL, Rfl: 3    methylPREDNIsolone (MEDROL (PAK)) 4 mg tablet, Take medication as directed on package for 6 days. Take with food., Disp: 21 tablet, Rfl: 0    omeprazole DR (PRILOSEC) 20 mg capsule, Take one capsule by mouth daily before breakfast., Disp: 90 capsule, Rfl: 3    ondansetron HCL (ZOFRAN) 8 mg tablet, Take 1 tablet (8 mg) by mouth 30 minutes prior to each dose of Lenvatinib., Disp: 40 tablet, Rfl: 3    oxyCODONE (ROXICODONE) 5 mg tablet, Take one tablet by mouth every 6 hours as needed for Pain., Disp: 20 tablet, Rfl: 0    oxyCODONE (ROXICODONE) 5 mg tablet, Take one tablet by mouth every 6 hours as needed for Pain., Disp: 20 tablet, Rfl: 0    oxyCODONE-acetaminophen (PERCOCET) 7.5-325 mg tablet, Take one tablet by mouth every 12 hours as needed for Pain., Disp: , Rfl:     rifAXIMin (XIFAXAN) 550 mg tablet, Take one tablet by mouth every 12 hours., Disp: 60 tablet, Rfl: 11    spironolactone (ALDACTONE) 50 mg tablet, Take one tablet by mouth daily with breakfast. Take with food., Disp: 90 tablet, Rfl: 3    ALLERGIES:   Allergies   Allergen Reactions    Ciprofloxacin BLISTERS    Latex RASH and REDNESS     Pt states it tore up her skin

## 2023-02-19 ENCOUNTER — Encounter: Admit: 2023-02-19 | Discharge: 2023-02-19 | Payer: MEDICARE

## 2023-02-20 ENCOUNTER — Encounter: Admit: 2023-02-20 | Discharge: 2023-02-20 | Payer: MEDICARE

## 2023-02-22 ENCOUNTER — Encounter: Admit: 2023-02-22 | Discharge: 2023-02-22 | Payer: MEDICARE

## 2023-02-25 ENCOUNTER — Encounter: Admit: 2023-02-25 | Discharge: 2023-02-25 | Payer: MEDICARE

## 2023-02-26 ENCOUNTER — Encounter: Admit: 2023-02-26 | Discharge: 2023-02-26 | Payer: MEDICARE

## 2023-02-26 NOTE — Telephone Encounter
Nurse Coordinator received a message from Elmarie Shiley (patient's daughter) that the patient is interested in discontinuing care. She reports that her cancer has spread and Jozette does not want any treatment. Patient's daughter reports they have an appointment to meet with Oncology on Wednesday and will request information on palliative/hospice care from them.    Wilmon Arms, MSN, RN

## 2023-02-28 ENCOUNTER — Encounter: Admit: 2023-02-28 | Discharge: 2023-02-28 | Payer: MEDICARE

## 2023-02-28 NOTE — Progress Notes
Plan:  Hospice     Intervention:  SW notified by Tommie Raymond RN pt needs hospice.     SW called and spoke with pt's daughter Elmarie Shiley.  Explained role and reason for call.  SW discussed arranging hospice.  Explained hospice will provide symptom management/comfort care, pt will receive nursing visits from hospice RN, family will have access to hospice RN 24/7, provide DME if needed, and other supportive services.  Daughter reported pt is currently at [facility] in [place], [place].  Pt would like to go home with hospice if this is a safe option.  Family would like to meet with [facility]. [facility].  SW agreed to send referral to [facility].  Answered questions/provided support.  No other needs identified at this time.  SW will continue to provide support as needed.     Hospice orders obtained.  SW prepared referral.  Orders/referral faxed to [facility].     RN updated.     Samantha Crimes, LMSW

## 2023-03-01 ENCOUNTER — Encounter: Admit: 2023-03-01 | Discharge: 2023-03-01 | Payer: MEDICARE

## 2023-03-01 NOTE — Telephone Encounter
Spoke with patients daughter who advised that patient has decided to stop treatment. NC aware.

## 2023-03-02 ENCOUNTER — Encounter: Admit: 2023-03-02 | Discharge: 2023-03-02 | Payer: MEDICARE

## 2023-03-02 NOTE — Telephone Encounter
Nurse Coordinator reached out to patients daughter Elmarie Shiley) after receiving a mychart message. Questions and concerns addressed.     Wilmon Arms, MSN, RN

## 2023-03-29 ENCOUNTER — Encounter: Admit: 2023-03-29 | Discharge: 2023-03-29 | Payer: MEDICARE

## 2023-04-24 DEATH — deceased

## 2023-07-12 ENCOUNTER — Encounter: Admit: 2023-07-12 | Discharge: 2023-07-12 | Payer: MEDICARE
